# Patient Record
Sex: Male | Born: 1945
Health system: Southern US, Community
[De-identification: ages and names within clinical notes are randomized; demographics above are authoritative.]

## PROBLEM LIST (undated history)

## (undated) DIAGNOSIS — Z8679 Personal history of other diseases of the circulatory system: Secondary | ICD-10-CM

## (undated) DIAGNOSIS — M549 Dorsalgia, unspecified: Secondary | ICD-10-CM

## (undated) DIAGNOSIS — G8929 Other chronic pain: Secondary | ICD-10-CM

## (undated) DIAGNOSIS — J90 Pleural effusion, not elsewhere classified: Secondary | ICD-10-CM

## (undated) DIAGNOSIS — Z951 Presence of aortocoronary bypass graft: Secondary | ICD-10-CM

## (undated) DIAGNOSIS — E669 Obesity, unspecified: Secondary | ICD-10-CM

## (undated) DIAGNOSIS — I480 Paroxysmal atrial fibrillation: Secondary | ICD-10-CM

## (undated) DIAGNOSIS — I872 Venous insufficiency (chronic) (peripheral): Secondary | ICD-10-CM

## (undated) DIAGNOSIS — I200 Unstable angina: Secondary | ICD-10-CM

## (undated) DIAGNOSIS — E785 Hyperlipidemia, unspecified: Secondary | ICD-10-CM

## (undated) DIAGNOSIS — I2510 Atherosclerotic heart disease of native coronary artery without angina pectoris: Secondary | ICD-10-CM

## (undated) DIAGNOSIS — I440 Atrioventricular block, first degree: Secondary | ICD-10-CM

## (undated) HISTORY — PX: HERNIA REPAIR: SHX51

## (undated) HISTORY — DX: Atherosclerotic heart disease of native coronary artery without angina pectoris: I25.10

## (undated) HISTORY — PX: DUPUYTREN / PALMAR FASCIOTOMY: SUR601

---

## 1998-10-24 ENCOUNTER — Ambulatory Visit: Admission: RE | Admit: 1998-10-24 | Discharge: 1998-10-24 | Payer: Self-pay | Admitting: Family Medicine

## 1998-10-24 ENCOUNTER — Encounter: Payer: Self-pay | Admitting: Family Medicine

## 1999-06-17 ENCOUNTER — Encounter: Payer: Self-pay | Admitting: Family Medicine

## 1999-06-17 ENCOUNTER — Ambulatory Visit (HOSPITAL_COMMUNITY): Admission: RE | Admit: 1999-06-17 | Discharge: 1999-06-17 | Payer: Self-pay | Admitting: Family Medicine

## 2001-05-20 ENCOUNTER — Ambulatory Visit (HOSPITAL_COMMUNITY): Admission: RE | Admit: 2001-05-20 | Discharge: 2001-05-20 | Payer: Self-pay

## 2001-05-20 ENCOUNTER — Encounter (INDEPENDENT_AMBULATORY_CARE_PROVIDER_SITE_OTHER): Payer: Self-pay | Admitting: Specialist

## 2003-01-15 ENCOUNTER — Emergency Department (HOSPITAL_COMMUNITY): Admission: EM | Admit: 2003-01-15 | Discharge: 2003-01-15 | Payer: Self-pay | Admitting: Emergency Medicine

## 2003-01-15 ENCOUNTER — Encounter: Payer: Self-pay | Admitting: Emergency Medicine

## 2005-03-09 ENCOUNTER — Ambulatory Visit: Payer: Self-pay | Admitting: Internal Medicine

## 2005-04-26 ENCOUNTER — Ambulatory Visit: Payer: Self-pay | Admitting: Internal Medicine

## 2005-05-18 ENCOUNTER — Ambulatory Visit: Payer: Self-pay | Admitting: Internal Medicine

## 2009-01-25 ENCOUNTER — Ambulatory Visit: Payer: Self-pay | Admitting: Internal Medicine

## 2009-01-25 DIAGNOSIS — T887XXA Unspecified adverse effect of drug or medicament, initial encounter: Secondary | ICD-10-CM

## 2009-01-25 DIAGNOSIS — M546 Pain in thoracic spine: Secondary | ICD-10-CM | POA: Insufficient documentation

## 2009-01-26 ENCOUNTER — Ambulatory Visit: Payer: Self-pay | Admitting: Internal Medicine

## 2009-01-27 ENCOUNTER — Encounter: Payer: Self-pay | Admitting: Internal Medicine

## 2009-01-27 LAB — CONVERTED CEMR LAB
Cholesterol: 173 mg/dL (ref 0–200)
Glucose, Bld: 87 mg/dL (ref 70–99)
HDL: 31.9 mg/dL — ABNORMAL LOW (ref >39.0)
LDL Cholesterol: 123 mg/dL — ABNORMAL HIGH (ref 0–99)
Total CHOL/HDL Ratio: 5.4
Triglycerides: 90 mg/dL (ref 0–149)
VLDL: 18 mg/dL (ref 0–40)

## 2011-05-11 NOTE — Op Note (Signed)
[place]. [facility]  Patient:    Alfred Thompson, Alfred Thompson                        MRN: [id number] Proc. Date: 05/20/01 Adm. Date:  [id number] Attending:  Gennie Alma CC:         Titus Dubin. Alwyn Ren, M.D. [facility]   Operative Report  [facility] [phone].  PREOPERATIVE DIAGNOSIS:  Left inguinal hernia.  POSTOPERATIVE DIAGNOSIS:  Indirect left inguinal hernia.  PROCEDURE:  Repair of left inguinal hernia with mesh.  SURGEON:  Milus Mallick, M.D.  ASSISTANT:  Gita Kudo, M.D.  ANESTHESIA:  Local infiltration with 1% Xylocaine with 1:200,000 parts of epinephrine, 27 cc, and monitored anesthesia care.  DESCRIPTION OF PROCEDURE:  Under adequate perioperative intravenous sedation, the patients left inguinal region was prepared and draped in the usual fashion.  Satisfactory local and field block anesthesia was instilled with the above anesthetic.  An oblique incision was made overlying the left inguinal canal and carried down through Scarpas and Campers fascia.  Bleeders were electrocoagulated.  Two large veins were divided over hemostats, which were ligated with 4-0 Vicryl.  The external oblique aponeurosis was exposed and opened in the direction of its fibers and out through the external inguinal ring.  The ilioinguinal nerve was placed in a position of protection during the procedure.  The spermatic cord was dissected up out of the inguinal canal and encircled with a Penrose drain.  There was a moderate-sized indirect hernia present as well as a lipoma of the spermatic cord.  The lipoma was dissected out and excised over a hemostat, which was ligated with 4-0 Vicryl. The hernia sac was dissected off of the cord structures, and a high ligation was done with 2-0 silk after reducing some omentum that was in the hernia sac. The redundant sac was excised, and the stump was allowed to retract into the preperitoneal space.  The repair was then carried out  using Atrium mesh, which was cut to appropriate size and then placed on the floor of the inguinal canal and sewn in position with interrupted sutures of 0 Surgilon.  Along the inferior side, the sutures were placed into the inguinal ligament and along the superior side into the conjoined tendon.  The apex suture was into the pubic tubercle.  Then the lateral aspect of the mesh was cut in order to accommodate the spermatic cord and the ilioinguinal nerve.  The tails of the mesh were crossed lateral to these structures to recreate the internal ring.  The tails were sutured to the inguinal ligament with 0 Surgilon.  Hemostasis was ascertained.  The external oblique aponeurosis was closed over the cord and the mesh with continuous suture of 3-0 Vicryl.  Scarpas fascia was closed with continuous suture of 4-0 Vicryl, and the subcuticular layer was reapproximated with a continuous suture of 5-0 Vicryl.  Half-inch Steri-Strips were applied to the skin.  Sterile dressing was applied.  The estimated blood loss for the procedure was negligible.  The patient tolerated the procedure well and left the operating room in satisfactory condition. DD:  05/20/01 TD:  05/20/01 Job: [id number]

## 2011-11-30 ENCOUNTER — Telehealth: Payer: Self-pay

## 2011-11-30 ENCOUNTER — Ambulatory Visit: Payer: Self-pay | Admitting: Internal Medicine

## 2011-11-30 NOTE — Telephone Encounter (Signed)
Message left on voicemail: ? If patient should see primary doctor or dentist   Patient with soreness in his mouth, not tooth. Patient's wife questions is he could have a mouth ulcer. Patient scheduled to be seen today at 4:45, Jasmine December will check with her husband, if patient unable to come at that time Jasmine December will call to reschedule

## 2011-12-03 ENCOUNTER — Ambulatory Visit: Payer: Self-pay | Admitting: Internal Medicine

## 2012-09-18 ENCOUNTER — Ambulatory Visit: Payer: Self-pay | Admitting: Internal Medicine

## 2013-09-23 DIAGNOSIS — J90 Pleural effusion, not elsewhere classified: Secondary | ICD-10-CM

## 2013-09-23 HISTORY — DX: Pleural effusion, not elsewhere classified: J90

## 2013-10-09 ENCOUNTER — Encounter (HOSPITAL_COMMUNITY)
Admission: EM | Disposition: A | Discharge status: Home / Self Care | Payer: Self-pay | Source: Home / Self Care | Attending: Surgery

## 2013-10-09 ENCOUNTER — Other Ambulatory Visit: Payer: Self-pay | Admitting: *Deleted

## 2013-10-09 ENCOUNTER — Inpatient Hospital Stay (HOSPITAL_COMMUNITY): Payer: Medicare Other

## 2013-10-09 ENCOUNTER — Emergency Department (HOSPITAL_COMMUNITY): Payer: Medicare Other

## 2013-10-09 ENCOUNTER — Encounter (HOSPITAL_COMMUNITY): Payer: Self-pay | Admitting: Emergency Medicine

## 2013-10-09 ENCOUNTER — Inpatient Hospital Stay (HOSPITAL_COMMUNITY)
Admission: EM | Admit: 2013-10-09 | Discharge: 2013-10-20 | DRG: 234 | Disposition: A | Discharge status: Home / Self Care | Payer: Medicare Other | Attending: Surgery | Admitting: Surgery

## 2013-10-09 DIAGNOSIS — E669 Obesity, unspecified: Secondary | ICD-10-CM | POA: Insufficient documentation

## 2013-10-09 DIAGNOSIS — I4891 Unspecified atrial fibrillation: Secondary | ICD-10-CM | POA: Diagnosis present

## 2013-10-09 DIAGNOSIS — K560 Paralytic ileus: Secondary | ICD-10-CM | POA: Diagnosis not present

## 2013-10-09 DIAGNOSIS — I4892 Unspecified atrial flutter: Secondary | ICD-10-CM | POA: Diagnosis not present

## 2013-10-09 DIAGNOSIS — I2510 Atherosclerotic heart disease of native coronary artery without angina pectoris: Secondary | ICD-10-CM

## 2013-10-09 DIAGNOSIS — Z7982 Long term (current) use of aspirin: Secondary | ICD-10-CM

## 2013-10-09 DIAGNOSIS — Z951 Presence of aortocoronary bypass graft: Secondary | ICD-10-CM

## 2013-10-09 DIAGNOSIS — Z6833 Body mass index (BMI) 33.0-33.9, adult: Secondary | ICD-10-CM

## 2013-10-09 DIAGNOSIS — K5900 Constipation, unspecified: Secondary | ICD-10-CM | POA: Diagnosis not present

## 2013-10-09 DIAGNOSIS — I480 Paroxysmal atrial fibrillation: Secondary | ICD-10-CM

## 2013-10-09 DIAGNOSIS — M549 Dorsalgia, unspecified: Secondary | ICD-10-CM | POA: Diagnosis present

## 2013-10-09 DIAGNOSIS — E8779 Other fluid overload: Secondary | ICD-10-CM | POA: Diagnosis not present

## 2013-10-09 DIAGNOSIS — Z8679 Personal history of other diseases of the circulatory system: Secondary | ICD-10-CM | POA: Diagnosis present

## 2013-10-09 DIAGNOSIS — G8929 Other chronic pain: Secondary | ICD-10-CM | POA: Diagnosis present

## 2013-10-09 DIAGNOSIS — I200 Unstable angina: Secondary | ICD-10-CM

## 2013-10-09 DIAGNOSIS — E119 Type 2 diabetes mellitus without complications: Secondary | ICD-10-CM | POA: Diagnosis present

## 2013-10-09 DIAGNOSIS — Z01810 Encounter for preprocedural cardiovascular examination: Secondary | ICD-10-CM

## 2013-10-09 DIAGNOSIS — R32 Unspecified urinary incontinence: Secondary | ICD-10-CM | POA: Diagnosis not present

## 2013-10-09 DIAGNOSIS — D6959 Other secondary thrombocytopenia: Secondary | ICD-10-CM | POA: Diagnosis not present

## 2013-10-09 DIAGNOSIS — I209 Angina pectoris, unspecified: Secondary | ICD-10-CM | POA: Insufficient documentation

## 2013-10-09 DIAGNOSIS — D62 Acute posthemorrhagic anemia: Secondary | ICD-10-CM | POA: Diagnosis not present

## 2013-10-09 DIAGNOSIS — I10 Essential (primary) hypertension: Secondary | ICD-10-CM

## 2013-10-09 DIAGNOSIS — I519 Heart disease, unspecified: Secondary | ICD-10-CM | POA: Diagnosis not present

## 2013-10-09 DIAGNOSIS — I498 Other specified cardiac arrhythmias: Secondary | ICD-10-CM | POA: Diagnosis not present

## 2013-10-09 DIAGNOSIS — Z79899 Other long term (current) drug therapy: Secondary | ICD-10-CM

## 2013-10-09 DIAGNOSIS — I517 Cardiomegaly: Secondary | ICD-10-CM | POA: Diagnosis present

## 2013-10-09 HISTORY — DX: Unstable angina: I20.0

## 2013-10-09 HISTORY — DX: Paroxysmal atrial fibrillation: I48.0

## 2013-10-09 HISTORY — DX: Other chronic pain: G89.29

## 2013-10-09 HISTORY — DX: Presence of aortocoronary bypass graft: Z95.1

## 2013-10-09 HISTORY — DX: Atherosclerotic heart disease of native coronary artery without angina pectoris: I25.10

## 2013-10-09 HISTORY — DX: Obesity, unspecified: E66.9

## 2013-10-09 HISTORY — PX: LEFT HEART CATHETERIZATION WITH CORONARY ANGIOGRAM: SHX5451

## 2013-10-09 HISTORY — DX: Dorsalgia, unspecified: M54.9

## 2013-10-09 LAB — ABO/RH: ABO/RH(D): A POS

## 2013-10-09 LAB — COMPREHENSIVE METABOLIC PANEL
ALT: 20 U/L (ref 0–53)
AST: 19 U/L (ref 0–37)
Albumin: 3.7 g/dL (ref 3.5–5.2)
Alkaline Phosphatase: 46 U/L (ref 39–117)
BUN: 11 mg/dL (ref 6–23)
CO2: 27 mEq/L (ref 19–32)
Calcium: 8.9 mg/dL (ref 8.4–10.5)
Chloride: 106 mEq/L (ref 96–112)
GFR calc Af Amer: 81 mL/min — ABNORMAL LOW (ref >90)
GFR calc non Af Amer: 70 mL/min — ABNORMAL LOW (ref >90)
Glucose, Bld: 100 mg/dL — ABNORMAL HIGH (ref 70–99)
Sodium: 141 mEq/L (ref 135–145)
Total Bilirubin: 0.9 mg/dL (ref 0.3–1.2)

## 2013-10-09 LAB — BASIC METABOLIC PANEL
BUN: 11 mg/dL (ref 6–23)
CO2: 24 mEq/L (ref 19–32)
Calcium: 9.6 mg/dL (ref 8.4–10.5)
Chloride: 104 mEq/L (ref 96–112)
Creatinine, Ser: 1.06 mg/dL (ref 0.50–1.35)
GFR calc Af Amer: 83 mL/min — ABNORMAL LOW (ref >90)
GFR calc non Af Amer: 72 mL/min — ABNORMAL LOW (ref >90)
Glucose, Bld: 150 mg/dL — ABNORMAL HIGH (ref 70–99)
Potassium: 4 mEq/L (ref 3.5–5.1)
Sodium: 141 mEq/L (ref 135–145)

## 2013-10-09 LAB — APTT: aPTT: 43 seconds — ABNORMAL HIGH (ref 24–37)

## 2013-10-09 LAB — TROPONIN I: Troponin I: <0.3 ng/mL (ref <0.30)

## 2013-10-09 LAB — CBC
HCT: 47.2 % (ref 39.0–52.0)
Hemoglobin: 17.3 g/dL — ABNORMAL HIGH (ref 13.0–17.0)
MCH: 33.7 pg (ref 26.0–34.0)
MCHC: 36.7 g/dL — ABNORMAL HIGH (ref 30.0–36.0)
MCV: 91.8 fL (ref 78.0–100.0)
Platelets: 183 10*3/uL (ref 150–400)
RBC: 5.14 MIL/uL (ref 4.22–5.81)
RDW: 13.2 % (ref 11.5–15.5)
WBC: 9.2 10*3/uL (ref 4.0–10.5)

## 2013-10-09 LAB — POCT ACTIVATED CLOTTING TIME: Activated Clotting Time: 140 seconds

## 2013-10-09 LAB — POCT I-STAT 3, ART BLOOD GAS (G3+)
Bicarbonate: 24.5 mEq/L — ABNORMAL HIGH (ref 20.0–24.0)
Patient temperature: 98.7
TCO2: 26 mmol/L (ref 0–100)
pH, Arterial: 7.39 (ref 7.350–7.450)
pO2, Arterial: 94 mmHg (ref 80.0–100.0)

## 2013-10-09 LAB — CBC WITH DIFFERENTIAL/PLATELET
Basophils Absolute: 0 10*3/uL (ref 0.0–0.1)
Eosinophils Relative: 2 % (ref 0–5)
HCT: 45.9 % (ref 39.0–52.0)
Lymphocytes Relative: 18 % (ref 12–46)
Lymphs Abs: 1.7 10*3/uL (ref 0.7–4.0)
MCV: 92.9 fL (ref 78.0–100.0)
Monocytes Absolute: 1 10*3/uL (ref 0.1–1.0)
Monocytes Relative: 11 % (ref 3–12)
Neutro Abs: 6.4 10*3/uL (ref 1.7–7.7)
Platelets: 168 10*3/uL (ref 150–400)
RBC: 4.94 MIL/uL (ref 4.22–5.81)
WBC: 9.3 10*3/uL (ref 4.0–10.5)

## 2013-10-09 LAB — MRSA PCR SCREENING: MRSA by PCR: NEGATIVE

## 2013-10-09 LAB — TYPE AND SCREEN: ABO/RH(D): A POS

## 2013-10-09 LAB — PROTIME-INR: INR: 1.19 (ref 0.00–1.49)

## 2013-10-09 LAB — MAGNESIUM: Magnesium: 2.1 mg/dL (ref 1.5–2.5)

## 2013-10-09 LAB — SURGICAL PCR SCREEN
MRSA, PCR: NEGATIVE
Staphylococcus aureus: NEGATIVE

## 2013-10-09 LAB — POCT I-STAT TROPONIN I: Troponin i, poc: 0.01 ng/mL (ref 0.00–0.08)

## 2013-10-09 LAB — URINALYSIS, ROUTINE W REFLEX MICROSCOPIC
Ketones, ur: 15 mg/dL — AB
Leukocytes, UA: NEGATIVE
Nitrite: NEGATIVE
Protein, ur: NEGATIVE mg/dL
Urobilinogen, UA: 0.2 mg/dL (ref 0.0–1.0)

## 2013-10-09 LAB — PRO B NATRIURETIC PEPTIDE: Pro B Natriuretic peptide (BNP): 268.2 pg/mL — ABNORMAL HIGH (ref 0–125)

## 2013-10-09 LAB — TSH: TSH: 0.76 u[IU]/mL (ref 0.350–4.500)

## 2013-10-09 SURGERY — LEFT HEART CATHETERIZATION WITH CORONARY ANGIOGRAM
Anesthesia: LOCAL

## 2013-10-09 MED ORDER — ONDANSETRON HCL 4 MG/2ML IJ SOLN: 4.0000 mg | Freq: Four times a day (QID) | INTRAMUSCULAR | Status: DC | PRN

## 2013-10-09 MED ORDER — CHLORHEXIDINE GLUCONATE 4 % EX LIQD: 60.0000 mL | Freq: Once | CUTANEOUS | Status: DC

## 2013-10-09 MED ORDER — SODIUM CHLORIDE 0.9 % IJ SOLN: 3.0000 mL | Freq: Two times a day (BID) | INTRAMUSCULAR | Status: DC

## 2013-10-09 MED ORDER — VERAPAMIL HCL 2.5 MG/ML IV SOLN
INTRAVENOUS | Status: AC
  Filled 2013-10-09: qty 2

## 2013-10-09 MED ORDER — HEPARIN BOLUS VIA INFUSION
4000.0000 [IU] | Freq: Once | INTRAVENOUS | Status: AC
  Administered 2013-10-09: 4000 [IU] via INTRAVENOUS
  Filled 2013-10-09: qty 4000

## 2013-10-09 MED ORDER — AMIODARONE HCL IN DEXTROSE 360-4.14 MG/200ML-% IV SOLN
30.0000 mg/h | INTRAVENOUS | Status: DC
  Filled 2013-10-09: qty 200

## 2013-10-09 MED ORDER — ASPIRIN 81 MG PO CHEW
324.0000 mg | CHEWABLE_TABLET | Freq: Once | ORAL | Status: AC
  Administered 2013-10-09: 324 mg via ORAL
  Filled 2013-10-09: qty 4

## 2013-10-09 MED ORDER — ASPIRIN 325 MG PO TABS
325.0000 mg | ORAL_TABLET | Freq: Every day | ORAL | Status: DC
  Filled 2013-10-09: qty 1

## 2013-10-09 MED ORDER — SODIUM CHLORIDE 0.9 % IV SOLN
INTRAVENOUS | Status: DC
  Administered 2013-10-09: 75 mL/h via INTRAVENOUS

## 2013-10-09 MED ORDER — METOPROLOL TARTRATE 12.5 MG HALF TABLET
12.5000 mg | ORAL_TABLET | Freq: Once | ORAL | Status: AC
  Administered 2013-10-10: 12.5 mg via ORAL
  Filled 2013-10-09: qty 1

## 2013-10-09 MED ORDER — DEXTROSE 5 % IV SOLN
750.0000 mg | INTRAVENOUS | Status: DC
  Filled 2013-10-09: qty 750

## 2013-10-09 MED ORDER — HEPARIN (PORCINE) IN NACL 100-0.45 UNIT/ML-% IJ SOLN
1100.0000 [IU]/h | INTRAMUSCULAR | Status: DC
  Administered 2013-10-09: 1100 [IU]/h via INTRAVENOUS
  Filled 2013-10-09 (×2): qty 250

## 2013-10-09 MED ORDER — SODIUM CHLORIDE 0.9 % IV SOLN: 250.0000 mL | INTRAVENOUS | Status: DC | PRN

## 2013-10-09 MED ORDER — TEMAZEPAM 15 MG PO CAPS: 15.0000 mg | ORAL_CAPSULE | Freq: Once | ORAL | Status: DC | PRN

## 2013-10-09 MED ORDER — HEPARIN (PORCINE) IN NACL 100-0.45 UNIT/ML-% IJ SOLN
1100.0000 [IU]/h | INTRAMUSCULAR | Status: DC
  Administered 2013-10-09: 1100 [IU]/h via INTRAVENOUS
  Filled 2013-10-09: qty 250

## 2013-10-09 MED ORDER — DOPAMINE-DEXTROSE 3.2-5 MG/ML-% IV SOLN
2.0000 ug/kg/min | INTRAVENOUS | Status: DC
  Filled 2013-10-09: qty 250

## 2013-10-09 MED ORDER — LIDOCAINE HCL (PF) 1 % IJ SOLN
INTRAMUSCULAR | Status: AC
  Filled 2013-10-09: qty 30

## 2013-10-09 MED ORDER — BISACODYL 5 MG PO TBEC: 5.0000 mg | DELAYED_RELEASE_TABLET | Freq: Once | ORAL | Status: DC

## 2013-10-09 MED ORDER — AMIODARONE HCL IN DEXTROSE 360-4.14 MG/200ML-% IV SOLN: 60.0000 mg/h | INTRAVENOUS | Status: AC

## 2013-10-09 MED ORDER — VANCOMYCIN HCL 10 G IV SOLR
1500.0000 mg | INTRAVENOUS | Status: DC
  Filled 2013-10-09: qty 1500

## 2013-10-09 MED ORDER — FENTANYL CITRATE 0.05 MG/ML IJ SOLN
INTRAMUSCULAR | Status: AC
  Filled 2013-10-09: qty 2

## 2013-10-09 MED ORDER — SODIUM CHLORIDE 0.9 % IJ SOLN: 3.0000 mL | INTRAMUSCULAR | Status: DC | PRN

## 2013-10-09 MED ORDER — ASPIRIN EC 81 MG PO TBEC
81.0000 mg | DELAYED_RELEASE_TABLET | Freq: Every day | ORAL | Status: DC | PRN
  Filled 2013-10-09: qty 1

## 2013-10-09 MED ORDER — ZOLPIDEM TARTRATE 5 MG PO TABS
5.0000 mg | ORAL_TABLET | Freq: Every evening | ORAL | Status: DC | PRN
  Administered 2013-10-10: 5 mg via ORAL
  Filled 2013-10-09: qty 1

## 2013-10-09 MED ORDER — PANTOPRAZOLE SODIUM 40 MG PO TBEC
40.0000 mg | DELAYED_RELEASE_TABLET | Freq: Every day | ORAL | Status: DC
  Administered 2013-10-10: 40 mg via ORAL
  Filled 2013-10-09: qty 1

## 2013-10-09 MED ORDER — DIAZEPAM 5 MG PO TABS
5.0000 mg | ORAL_TABLET | Freq: Once | ORAL | Status: AC
  Administered 2013-10-10: 5 mg via ORAL
  Filled 2013-10-09: qty 1

## 2013-10-09 MED ORDER — ASPIRIN 81 MG PO CHEW: 81.0000 mg | CHEWABLE_TABLET | ORAL | Status: DC

## 2013-10-09 MED ORDER — HEPARIN (PORCINE) IN NACL 2-0.9 UNIT/ML-% IJ SOLN
INTRAMUSCULAR | Status: AC
  Filled 2013-10-09: qty 1000

## 2013-10-09 MED ORDER — DIAZEPAM 5 MG PO TABS
5.0000 mg | ORAL_TABLET | ORAL | Status: AC
  Administered 2013-10-09: 5 mg via ORAL
  Filled 2013-10-09: qty 1

## 2013-10-09 MED ORDER — HEPARIN SODIUM (PORCINE) 1000 UNIT/ML IJ SOLN
INTRAMUSCULAR | Status: DC
  Filled 2013-10-09: qty 30

## 2013-10-09 MED ORDER — AMIODARONE LOAD VIA INFUSION
150.0000 mg | Freq: Once | INTRAVENOUS | Status: AC
  Administered 2013-10-09: 150 mg via INTRAVENOUS

## 2013-10-09 MED ORDER — POTASSIUM CHLORIDE 2 MEQ/ML IV SOLN
80.0000 meq | INTRAVENOUS | Status: DC
  Filled 2013-10-09: qty 40

## 2013-10-09 MED ORDER — PHENYLEPHRINE HCL 10 MG/ML IJ SOLN
30.0000 ug/min | INTRAVENOUS | Status: DC
  Filled 2013-10-09: qty 2

## 2013-10-09 MED ORDER — MAGNESIUM SULFATE 50 % IJ SOLN
40.0000 meq | INTRAMUSCULAR | Status: DC
  Filled 2013-10-09: qty 10

## 2013-10-09 MED ORDER — SODIUM CHLORIDE 0.9 % IV SOLN
INTRAVENOUS | Status: DC
  Filled 2013-10-09: qty 1

## 2013-10-09 MED ORDER — EPINEPHRINE HCL 1 MG/ML IJ SOLN
0.5000 ug/min | INTRAVENOUS | Status: DC
  Filled 2013-10-09: qty 4

## 2013-10-09 MED ORDER — MIDAZOLAM HCL 2 MG/2ML IJ SOLN
INTRAMUSCULAR | Status: AC
  Filled 2013-10-09: qty 2

## 2013-10-09 MED ORDER — HEPARIN SODIUM (PORCINE) 1000 UNIT/ML IJ SOLN
INTRAMUSCULAR | Status: AC
  Filled 2013-10-09: qty 1

## 2013-10-09 MED ORDER — ALPRAZOLAM 0.25 MG PO TABS: 0.2500 mg | ORAL_TABLET | Freq: Three times a day (TID) | ORAL | Status: DC | PRN

## 2013-10-09 MED ORDER — METOPROLOL TARTRATE 25 MG PO TABS
25.0000 mg | ORAL_TABLET | Freq: Two times a day (BID) | ORAL | Status: DC
  Administered 2013-10-09: 25 mg via ORAL
  Filled 2013-10-09 (×4): qty 1

## 2013-10-09 MED ORDER — PLASMA-LYTE 148 IV SOLN
INTRAVENOUS | Status: DC
  Filled 2013-10-09: qty 2.5

## 2013-10-09 MED ORDER — ACETAMINOPHEN 325 MG PO TABS: 650.0000 mg | ORAL_TABLET | ORAL | Status: DC | PRN

## 2013-10-09 MED ORDER — DEXMEDETOMIDINE HCL IN NACL 400 MCG/100ML IV SOLN
0.1000 ug/kg/h | INTRAVENOUS | Status: DC
  Filled 2013-10-09: qty 100

## 2013-10-09 MED ORDER — METOPROLOL TARTRATE 25 MG PO TABS
25.0000 mg | ORAL_TABLET | Freq: Once | ORAL | Status: AC
  Administered 2013-10-09: 25 mg via ORAL
  Filled 2013-10-09: qty 1

## 2013-10-09 MED ORDER — DILTIAZEM HCL 100 MG IV SOLR
5.0000 mg/h | INTRAVENOUS | Status: DC
  Administered 2013-10-09: 5 mg/h via INTRAVENOUS
  Filled 2013-10-09: qty 100

## 2013-10-09 MED ORDER — SODIUM CHLORIDE 0.9 % IV SOLN
INTRAVENOUS | Status: DC
  Filled 2013-10-09: qty 40

## 2013-10-09 MED ORDER — NITROGLYCERIN 0.2 MG/ML ON CALL CATH LAB
INTRAVENOUS | Status: AC
  Filled 2013-10-09: qty 1

## 2013-10-09 MED ORDER — DEXTROSE 5 % IV SOLN
1.5000 g | INTRAVENOUS | Status: DC
  Filled 2013-10-09: qty 1.5

## 2013-10-09 MED ORDER — PANTOPRAZOLE SODIUM 40 MG PO TBEC: 40.0000 mg | DELAYED_RELEASE_TABLET | Freq: Every day | ORAL | Status: DC

## 2013-10-09 MED ORDER — SODIUM CHLORIDE 0.9 % IV SOLN: 1.0000 mL/kg/h | INTRAVENOUS | Status: DC

## 2013-10-09 MED ORDER — DILTIAZEM LOAD VIA INFUSION
15.0000 mg | Freq: Once | INTRAVENOUS | Status: DC
  Filled 2013-10-09: qty 15

## 2013-10-09 MED ORDER — AMIODARONE HCL IN DEXTROSE 360-4.14 MG/200ML-% IV SOLN
30.0000 mg/h | INTRAVENOUS | Status: DC
  Administered 2013-10-10 – 2013-10-11 (×2): 30 mg/h via INTRAVENOUS
  Filled 2013-10-09 (×10): qty 200

## 2013-10-09 MED ORDER — NITROGLYCERIN IN D5W 200-5 MCG/ML-% IV SOLN
2.0000 ug/min | INTRAVENOUS | Status: DC
  Filled 2013-10-09: qty 250

## 2013-10-09 NOTE — H&P (Signed)
Patient ID: Alfred Thompson MRN: [id number], DOB/AGE: May 21, 1946   Admit date: 10/09/2013   Primary Physician: Marga Melnick, MD Primary Cardiologist: Dr Herbie Baltimore (new)  HPI: 67 y/o male followed by Dr Alwyn Ren. He has no history of CAD or chest pain. No history of arrythmia. He says he had a stress test several years ago. He was a runner up until about 15 yrs ago. He says he "stopped running but kept eating". Last night he developed an uncomfortable feeling in his chest that persisted after taking antacids. He then developed some discomfort in his arms and asked his wife to bring him to the ER. He arrived here around 8:30 am. He was noted to be in rapid AF. His chest pain subsided with rate control after receiving Diltiazem. His Diltiazem had to be stopped secondary to low B/P with a systolic B/P in the 90s on 5 mg/hr. He remains in AF but is currently pain free. He does admit to a similar episode last weekend that lasted 3 hrs and resolved spontaneously. He denies palpitations, SOB, or diaphoresis.   Problem List: Past Medical History  Diagnosis Date  . Back pain, chronic   . Obesity (BMI 30-39.9)     Past Surgical History  Procedure Laterality Date  . Hernia repair    . Dupuytren / palmar fasciotomy Right      Allergies: No Known Allergies   Home Medications Current Facility-Administered Medications  Medication Dose Route Frequency Provider Last Rate Last Dose  . diltiazem (CARDIZEM) 1 mg/mL load via infusion 15 mg  15 mg Intravenous Once Raeford Razor, MD       And  . diltiazem (CARDIZEM) 100 mg in dextrose 5 % 100 mL infusion  5-15 mg/hr Intravenous Continuous Raeford Razor, MD 5 mL/hr at 10/09/13 0948 5 mg/hr at 10/09/13 0948   No current outpatient prescriptions on file.     Family History  Problem Relation Age of Onset  . Coronary artery disease Father 27    CABG     History   Social History  . Marital Status: Single    Spouse Name: N/A    Number of Children: N/A  .  Years of Education: N/A   Occupational History  . Not on file.   Social History Main Topics  . Smoking status: Never Smoker   . Smokeless tobacco: Not on file  . Alcohol Use: Yes  . Drug Use: No  . Sexual Activity: Not on file   Other Topics Concern  . Not on file   Social History Narrative   Married, one sone, no grandchildren. He drives for [employer]     Review of Systems: General: negative for chills, fever, night sweats or weight changes.  Cardiovascular: negative for chest pain, dyspnea on exertion, edema, orthopnea, palpitations, paroxysmal nocturnal dyspnea or shortness of breath Dermatological: negative for rash Respiratory: negative for cough or wheezing Urologic: negative for hematuria Abdominal: negative for nausea, vomiting, diarrhea, bright red blood per rectum, melena, or hematemesis Neurologic: negative for visual changes, syncope, or dizziness All other systems reviewed and are otherwise negative except as noted above.  Physical Exam: Blood pressure 93/61, pulse 119, temperature 98.2 F (36.8 C), temperature source Oral, resp. rate 17, height 5\' 8"  (1.727 m), weight 214 lb (97.07 kg), SpO2 96.00%.  General appearance: alert, cooperative, no distress and moderately obese Neck: no carotid bruit, no JVD and supple, symmetrical, trachea midline Lungs: clear to auscultation bilaterally Heart: irregularly irregular rhythm Abdomen: obese, non tender,  small umbilical hernia Extremities: extremities normal, atraumatic, no cyanosis or edema Pulses: 2+ and symmetric Skin: Skin color, texture, turgor normal. No rashes or lesions Neurologic: Grossly normal    Labs:   Results for orders placed during the hospital encounter of 10/09/13 (from the past 24 hour(s))  CBC     Status: Abnormal   Collection Time    10/09/13  8:47 AM      Result Value Range   WBC 9.2  4.0 - 10.5 K/uL   RBC 5.14  4.22 - 5.81 MIL/uL   Hemoglobin 17.3 (*) 13.0 - 17.0 g/dL   HCT  40.9  81.1 - 91.4 %   MCV 91.8  78.0 - 100.0 fL   MCH 33.7  26.0 - 34.0 pg   MCHC 36.7 (*) 30.0 - 36.0 g/dL   RDW 78.2  95.6 - 21.3 %   Platelets 183  150 - 400 K/uL  BASIC METABOLIC PANEL     Status: Abnormal   Collection Time    10/09/13  8:47 AM      Result Value Range   Sodium 141  135 - 145 mEq/L   Potassium 4.0  3.5 - 5.1 mEq/L   Chloride 104  96 - 112 mEq/L   CO2 24  19 - 32 mEq/L   Glucose, Bld 150 (*) 70 - 99 mg/dL   BUN 11  6 - 23 mg/dL   Creatinine, Ser 0.86  0.50 - 1.35 mg/dL   Calcium 9.6  8.4 - 57.8 mg/dL   GFR calc non Af Amer 72 (*) >90 mL/min   GFR calc Af Amer 83 (*) >90 mL/min  MAGNESIUM     Status: None   Collection Time    10/09/13  8:47 AM      Result Value Range   Magnesium 2.1  1.5 - 2.5 mg/dL  POCT I-STAT TROPONIN I     Status: None   Collection Time    10/09/13  8:59 AM      Result Value Range   Troponin i, poc 0.01  0.00 - 0.08 ng/mL   Comment 3           PRO B NATRIURETIC PEPTIDE     Status: Abnormal   Collection Time    10/09/13 11:02 AM      Result Value Range   Pro B Natriuretic peptide (BNP) 268.2 (*) 0 - 125 pg/mL     Radiology/Studies: Dg Chest Port 1 View  10/09/2013   CLINICAL DATA:  Chest discomfort. Tachycardia.  EXAM: PORTABLE CHEST - 1 VIEW  COMPARISON:  No comparison chest x-ray. Comparison thoracic spine plain film examination 01/26/2009.  FINDINGS: Cardiomegaly.  Mild central pulmonary vascular prominence without pulmonary edema.  Left base subsegmental atelectasis versus linear scarring.  No segmental infiltrate or pneumothorax.  The patient would eventually benefit from two view chest.  IMPRESSION: Cardiomegaly with mild central pulmonary vascular prominence. Please see above.   Electronically Signed   By: Bridgett Larsson M.D.   On: 10/09/2013 09:21    EKG:AF with NSST changes  ASSESSMENT AND PLAN:  Principal Problem:   Unstable angina Active Problems:   Atrial fibrillation with RVR   Obesity (BMI 30.0-34.9)   PLAN: Will  discuss with MD- ?cath. Start IV Heparin, ASA, and start oral beta blocker.  Deland Pretty, PA-C 10/09/2013, 11:51 AM  I have seen and evaluated the patient this PM along with Corine Shelter, PA. I agree with his findings, examination as well as  impression recommendations.  Very pleasant 26 y/op man with no prior PMH noted.  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  He has noted vague chest tightness with climbing stairs or walking uphill at a brisk rate for several months.  He presented to [facility] ER this PM after being awakened by a nagging ~6-8/10 chest pressure that felt like a band squeezing his arms to his chest.  He did not note palpitations.  He had a similar episode ~1-2 weeks ago that resolved after a couple of hours, but this time it did not resolve.   He came in to the ER & was noted to be in Afib with RVR.  His rate dropped after IV Diltiazem & his chest discomfort improved.  Unfortunately, his BP dropped relatively abruptly.  With the slower rate, the chest discomfort abated.  This discomfort was similar to, just more severe than what he gets climbing stairs.    He is currently resting comfortably with HR in 80s-110s.   His exam is otherwise benign.  I think he needs to be admitted for HR control & hopeful conversion.   I am concerned, however about the level of chest discomfort that he was having.  His prior symptoms with discomfort climbing stairs & hills is consistent with Class II Angina.  He now presents with Afib (at least the second episode of prolonged chest discomfort) RVR with significant anginal pain.  He will need an ischemic evaluation.  We spent ~20 minutes discussing options.   Option #1: admit, rate contro, IV Heparin, r/o MI & CAth vs. Myoview based upon Troponin levels Option #2: This is indeed a manifestation of Unstable Angina (essentially a "failed Stress Test"), therefore, invasive ischemic evaluation is warranted.   Admit, rate control, but LHC/Angiography today to definitively delineate his coronary anatomy & PCI if indicated.    He is not currently on BB, ASA or statin, but we truly are not aware of his risk factors beyond obesity.    After a long discussion, he feels that he would prefer Option #2 for definitive evaluation.  If it were not for the underlying Class II Angina, I would probably have recommended Option #1, but with that history & now rest angina, I agree with Option #2 to clarify if his current Sx were simply related to Afib RVR, or due to occlusive CAD.   The procedure with Risks/Benefits/Alternatives and Indications was reviewed with the patient and his wife..  All questions were answered.    Risks / Complications include, but not limited to: Death, MI, CVA/TIA, VF/VT (with defibrillation), Bradycardia (need for temporary pacer placement), contrast induced nephropathy, bleeding / bruising / hematoma / pseudoaneurysm, vascular or coronary injury (with possible emergent CT or Vascular Surgery), adverse medication reactions, infection.    The patient (and wife) voice understanding and agree to proceed.    MD Time with pt: 45 min  HARDING,DAVID W, M.D., M.S. [facility] [address] [place], [place]  [address]  [phone] Pager # [phone] 10/09/2013 3:02 PM

## 2013-10-09 NOTE — ED Notes (Signed)
Pt c/o chest pressure and SOB with exertion starting at 0300; pt noted to be tachycardic

## 2013-10-09 NOTE — ED Notes (Addendum)
Cardizem gtt discontinued per verbal order from Dr. Juleen China d/t hypotension.  Pt denies any symptoms at present; GCS 15 - denies discomfort.  Wife at bedside; PA-C from Cards at bedside for consult. Pt reports increased stress due to mother's illness.

## 2013-10-09 NOTE — Interval H&P Note (Signed)
History and Physical Interval Note:  10/09/2013 3:20 PM  Alfred Thompson  has presented today for surgery, with the diagnosis of Unstable Angina. The various methods of treatment have been discussed with the patient and family. After consideration of risks, benefits and other options for treatment, the patient has consented to  Procedure(s): LEFT HEART CATHETERIZATION WITH CORONARY ANGIOGRAM (N/A) +/- PCI   Cath Lab Visit (complete for each Cath Lab visit)  Clinical Evaluation Leading to the Procedure:   ACS: yes  Non-ACS:    Anginal Classification: CCS II prior to current Unstable Angina  Anti-ischemic medical therapy: No Therapy  Non-Invasive Test Results: No non-invasive testing performed  Prior CABG: No previous CABG      as a surgical intervention .  The patient's history has been reviewed, patient examined, no change in status, stable for surgery.  I have reviewed the patient's chart and labs.  Questions were answered to the patient's satisfaction.     Alfred Thompson

## 2013-10-09 NOTE — Progress Notes (Signed)
ANTICOAGULATION CONSULT NOTE - Initial Consult  Pharmacy Consult for heparin Indication: chest pain/ACS  No Known Allergies  Patient Measurements: Height: 5\' 8"  (172.7 cm) Weight: 214 lb (97.07 kg) IBW/kg (Calculated) : 68.4 Heparin Dosing Weight: 89kg  Vital Signs: Temp: 98.2 F (36.8 C) (10/17 0846) Temp src: Oral (10/17 0846) BP: 93/61 mmHg (10/17 1113) Pulse Rate: 119 (10/17 1113)  Labs:  Recent Labs  10/09/13 0847  HGB 17.3*  HCT 47.2  PLT 183  CREATININE 1.06    Estimated Creatinine Clearance: 78.5 ml/min (by C-G formula based on Cr of 1.06).   Medical History: Past Medical History  Diagnosis Date  . Back pain, chronic   . Obesity (BMI 30-39.9)     Medications:  Infusions:  . diltiazem     And  . diltiazem (CARDIZEM) infusion 5 mg/hr (10/09/13 0948)  . heparin    . heparin      Assessment: 67 yom presented to the ED with CP. Found to be in afib. To start IV heparin for unstable angina and afib with RVR. Baseline H/H 17.3/47.2, plts 182 and pt is not on any anticoagulation PTA.   Goal of Therapy:  Heparin level 0.3-0.7 units/ml Monitor platelets by anticoagulation protocol: Yes   Plan:  1. Heparin bolus 4000 units IV x 1 2. Heparin gtt 1100 units/hr 3. Check a 6 hour heparin level 4. Daily heparin level and CBC  Jathniel Smeltzer, Drake Leach 10/09/2013,12:28 PM

## 2013-10-09 NOTE — Progress Notes (Addendum)
VASCULAR LAB PRELIMINARY  PRELIMINARY  PRELIMINARY  PRELIMINARY  Pre-op Cardiac Surgery  Carotid Findings:  Bilateral:  1-39% ICA stenosis.  Vertebral artery flow is antegrade.       Upper Extremity Right Left  Brachial Pressures  124 biphasic  Radial Waveforms  biphasic  Ulnar Waveforms  biphasic  Palmar Arch (Allen's Test) Cardiac cath via right radial artery. Doppler waveforms obliterate with ulnar and remain normal with radial compressions.   Findings:  Palpable pedal pulses x 4.    Ludwin Flahive, RVT 10/09/2013, 5:25 PM

## 2013-10-09 NOTE — Consult Note (Signed)
[address]       [place] [address]             [phone]        Reason for Consult: High grade left main and multi-vessel coronary artery disease Referring Physician:  Dr. Yitzhak Awan Lemma.  Alfred Thompson is an 67 y.o. male.  HPI:   67 y/o male with no history of CAD or chest pain. No history of arrythmia. He says he had a stress test several years ago. He was a runner up until about 15 yrs ago. He says he stopped running due to ankle injury but kept eating. Last night he developed an uncomfortable feeling in his chest that persisted after taking antacids. He then developed some discomfort in his arms and asked his wife to bring him to the ER. He arrived here around 8:30 am. He was noted to be in rapid AF. His chest pain subsided with rate control after receiving Diltiazem. His Diltiazem had to be stopped secondary to low B/P with a systolic B/P in the 90s on 5 mg/hr. He remains in AF but is currently pain free. He does admit to a similar episode last weekend that lasted 3 hrs and resolved spontaneously. Cath today shows 80% distal LM, high grade proximal RCA napkin ring lesion, and high grade LAD/diag with good LV function. He has been free of chest discomfort since his rate has been controlled.  Past Medical History  Diagnosis Date  . Back pain, chronic   . Obesity (BMI 30-39.9)     Past Surgical History  Procedure Laterality Date  . Hernia repair    . Dupuytren / palmar fasciotomy Right     Family History  Problem Relation Age of Onset  . Coronary artery disease Father 48    CABG    Social History:  reports that he has never smoked. He does not have any smokeless tobacco history on file. He reports that he drinks alcohol. He reports that he does not use illicit drugs.  Allergies: No Known Allergies  Medications:  I have reviewed the patient's current medications. Prior to Admission:  Prescriptions prior to admission  Medication Sig Dispense Refill    . aspirin EC 81 MG tablet Take 81 mg by mouth daily as needed for pain.      Marland Kitchen Bioflavonoid Products (VITAMIN C) CHEW Chew 1 tablet by mouth daily as needed (when feeling like coming down with a cold).      . calcium carbonate (TUMS EX) 750 MG chewable tablet Chew 2 tablets by mouth daily as needed for heartburn.      Marland Kitchen ibuprofen (ADVIL,MOTRIN) 200 MG tablet Take 800 mg by mouth daily as needed for pain. Monday-friday      . vitamin E 1000 UNIT capsule Take 1,000 Units by mouth daily.       Scheduled: . [START ON 10/10/2013] aspirin  325 mg Oral Daily  . metoprolol tartrate  25 mg Oral BID  . [START ON 10/10/2013] pantoprazole  40 mg Oral Q0600  . [START ON 10/10/2013] sodium chloride  3 mL Intravenous Q12H   Continuous: . sodium chloride 1 mL/kg/hr (10/09/13 1730)  . amiodarone (NEXTERONE PREMIX) 360 mg/200 mL dextrose 60 mg/hr (10/09/13 1730)   Followed by  . amiodarone (NEXTERONE PREMIX) 360 mg/200 mL dextrose    . heparin     BJY:NWGNFA chloride, acetaminophen, ALPRAZolam, aspirin EC, ondansetron (ZOFRAN) IV, sodium chloride, zolpidem  Results for orders  placed during the hospital encounter of 10/09/13 (from the past 48 hour(s))  CBC     Status: Abnormal   Collection Time    10/09/13  8:47 AM      Result Value Range   WBC 9.2  4.0 - 10.5 K/uL   RBC 5.14  4.22 - 5.81 MIL/uL   Hemoglobin 17.3 (*) 13.0 - 17.0 g/dL   HCT 11.9  14.7 - 82.9 %   MCV 91.8  78.0 - 100.0 fL   MCH 33.7  26.0 - 34.0 pg   MCHC 36.7 (*) 30.0 - 36.0 g/dL   RDW 56.2  13.0 - 86.5 %   Platelets 183  150 - 400 K/uL  BASIC METABOLIC PANEL     Status: Abnormal   Collection Time    10/09/13  8:47 AM      Result Value Range   Sodium 141  135 - 145 mEq/L   Potassium 4.0  3.5 - 5.1 mEq/L   Chloride 104  96 - 112 mEq/L   CO2 24  19 - 32 mEq/L   Glucose, Bld 150 (*) 70 - 99 mg/dL   BUN 11  6 - 23 mg/dL   Creatinine, Ser 7.84  0.50 - 1.35 mg/dL   Calcium 9.6  8.4 - 69.6 mg/dL   GFR calc non Af Amer 72 (*)  >90 mL/min   GFR calc Af Amer 83 (*) >90 mL/min   Comment: (NOTE)     The eGFR has been calculated using the CKD EPI equation.     This calculation has not been validated in all clinical situations.     eGFR's persistently <90 mL/min signify possible Chronic Kidney     Disease.  MAGNESIUM     Status: None   Collection Time    10/09/13  8:47 AM      Result Value Range   Magnesium 2.1  1.5 - 2.5 mg/dL  POCT I-STAT TROPONIN I     Status: None   Collection Time    10/09/13  8:59 AM      Result Value Range   Troponin i, poc 0.01  0.00 - 0.08 ng/mL   Comment 3            Comment: Due to the release kinetics of cTnI,     a negative result within the first hours     of the onset of symptoms does not rule out     myocardial infarction with certainty.     If myocardial infarction is still suspected,     repeat the test at appropriate intervals.  TSH     Status: None   Collection Time    10/09/13 10:31 AM      Result Value Range   TSH 0.760  0.350 - 4.500 uIU/mL   Comment: Performed at [facility]  PRO B NATRIURETIC PEPTIDE     Status: Abnormal   Collection Time    10/09/13 11:02 AM      Result Value Range   Pro B Natriuretic peptide (BNP) 268.2 (*) 0 - 125 pg/mL  MRSA PCR SCREENING     Status: None   Collection Time    10/09/13  2:19 PM      Result Value Range   MRSA by PCR NEGATIVE  NEGATIVE   Comment:            The GeneXpert MRSA Assay (FDA     approved for NASAL specimens     only),  is one component of a     comprehensive MRSA colonization     surveillance program. It is not     intended to diagnose MRSA     infection nor to guide or     monitor treatment for     MRSA infections.  POCT ACTIVATED CLOTTING TIME     Status: None   Collection Time    10/09/13  3:59 PM      Result Value Range   Activated Clotting Time 140    SURGICAL PCR SCREEN     Status: None   Collection Time    10/09/13  5:07 PM      Result Value Range   MRSA, PCR NEGATIVE  NEGATIVE    Staphylococcus aureus NEGATIVE  NEGATIVE   Comment:            The Xpert SA Assay (FDA     approved for NASAL specimens     in patients over 72 years of age),     is one component of     a comprehensive surveillance     program.  Test performance has     been validated by [facility] for patients greater     than or equal to 69 year old.     It is not intended     to diagnose infection nor to     guide or monitor treatment.  ABO/RH     Status: None   Collection Time    10/09/13  6:52 PM      Result Value Range   ABO/RH(D) A POS    TROPONIN I     Status: None   Collection Time    10/09/13  7:00 PM      Result Value Range   Troponin I <0.30  <0.30 ng/mL   Comment:            Due to the release kinetics of cTnI,     a negative result within the first hours     of the onset of symptoms does not rule out     myocardial infarction with certainty.     If myocardial infarction is still suspected,     repeat the test at appropriate intervals.  CBC WITH DIFFERENTIAL     Status: None   Collection Time    10/09/13  7:00 PM      Result Value Range   WBC 9.3  4.0 - 10.5 K/uL   RBC 4.94  4.22 - 5.81 MIL/uL   Hemoglobin 16.3  13.0 - 17.0 g/dL   HCT 16.1  09.6 - 04.5 %   MCV 92.9  78.0 - 100.0 fL   MCH 33.0  26.0 - 34.0 pg   MCHC 35.5  30.0 - 36.0 g/dL   RDW 40.9  81.1 - 91.4 %   Platelets 168  150 - 400 K/uL   Neutrophils Relative % 69  43 - 77 %   Neutro Abs 6.4  1.7 - 7.7 K/uL   Lymphocytes Relative 18  12 - 46 %   Lymphs Abs 1.7  0.7 - 4.0 K/uL   Monocytes Relative 11  3 - 12 %   Monocytes Absolute 1.0  0.1 - 1.0 K/uL   Eosinophils Relative 2  0 - 5 %   Eosinophils Absolute 0.2  0.0 - 0.7 K/uL   Basophils Relative 0  0 - 1 %   Basophils Absolute 0.0  0.0 - 0.1  K/uL  COMPREHENSIVE METABOLIC PANEL     Status: Abnormal   Collection Time    10/09/13  7:00 PM      Result Value Range   Sodium 141  135 - 145 mEq/L   Potassium 4.5  3.5 - 5.1 mEq/L   Chloride 106  96 - 112  mEq/L   CO2 27  19 - 32 mEq/L   Glucose, Bld 100 (*) 70 - 99 mg/dL   BUN 11  6 - 23 mg/dL   Creatinine, Ser 1.61  0.50 - 1.35 mg/dL   Calcium 8.9  8.4 - 09.6 mg/dL   Total Protein 6.4  6.0 - 8.3 g/dL   Albumin 3.7  3.5 - 5.2 g/dL   AST 19  0 - 37 U/L   ALT 20  0 - 53 U/L   Alkaline Phosphatase 46  39 - 117 U/L   Total Bilirubin 0.9  0.3 - 1.2 mg/dL   GFR calc non Af Amer 70 (*) >90 mL/min   GFR calc Af Amer 81 (*) >90 mL/min   Comment: (NOTE)     The eGFR has been calculated using the CKD EPI equation.     This calculation has not been validated in all clinical situations.     eGFR's persistently <90 mL/min signify possible Chronic Kidney     Disease.  PROTIME-INR     Status: None   Collection Time    10/09/13  7:00 PM      Result Value Range   Prothrombin Time 14.8  11.6 - 15.2 seconds   INR 1.19  0.00 - 1.49  APTT     Status: Abnormal   Collection Time    10/09/13  7:00 PM      Result Value Range   aPTT 43 (*) 24 - 37 seconds   Comment:            IF BASELINE aPTT IS ELEVATED,     SUGGEST PATIENT RISK ASSESSMENT     BE USED TO DETERMINE APPROPRIATE     ANTICOAGULANT THERAPY.  TYPE AND SCREEN     Status: None   Collection Time    10/09/13  7:01 PM      Result Value Range   ABO/RH(D) A POS     Antibody Screen NEG     Sample Expiration 10/12/2013    URINALYSIS, ROUTINE W REFLEX MICROSCOPIC     Status: Abnormal   Collection Time    10/09/13  7:34 PM      Result Value Range   Color, Urine YELLOW  YELLOW   APPearance CLEAR  CLEAR   Specific Gravity, Urine 1.010  1.005 - 1.030   pH 7.0  5.0 - 8.0   Glucose, UA NEGATIVE  NEGATIVE mg/dL   Hgb urine dipstick NEGATIVE  NEGATIVE   Bilirubin Urine NEGATIVE  NEGATIVE   Ketones, ur 15 (*) NEGATIVE mg/dL   Protein, ur NEGATIVE  NEGATIVE mg/dL   Urobilinogen, UA 0.2  0.0 - 1.0 mg/dL   Nitrite NEGATIVE  NEGATIVE   Leukocytes, UA NEGATIVE  NEGATIVE   Comment: MICROSCOPIC NOT DONE ON URINES WITH NEGATIVE PROTEIN, BLOOD,  LEUKOCYTES, NITRITE, OR GLUCOSE <1000 mg/dL.    Dg Chest Port 1 View  10/09/2013   CLINICAL DATA:  Chest discomfort. Tachycardia.  EXAM: PORTABLE CHEST - 1 VIEW  COMPARISON:  No comparison chest x-ray. Comparison thoracic spine plain film examination 01/26/2009.  FINDINGS: Cardiomegaly.  Mild central pulmonary vascular prominence without pulmonary edema.  Left base subsegmental atelectasis versus linear scarring.  No segmental infiltrate or pneumothorax.  The patient would eventually benefit from two view chest.  IMPRESSION: Cardiomegaly with mild central pulmonary vascular prominence. Please see above.   Electronically Signed   By: Bridgett Larsson M.D.   On: 10/09/2013 09:21    Review of Systems  Constitutional: Negative.   HENT: Negative.   Eyes: Negative.   Respiratory: Negative for shortness of breath.   Cardiovascular: Positive for chest pain. Negative for orthopnea, leg swelling and PND.  Gastrointestinal: Negative.   Genitourinary: Negative.   Musculoskeletal: Negative.   Skin: Negative.   Neurological: Negative.   Endo/Heme/Allergies: Negative.   Psychiatric/Behavioral: Negative.    Blood pressure 119/75, pulse 67, temperature 97.4 F (36.3 C), temperature source Oral, resp. rate 14, height 5\' 8"  (1.727 m), weight 98.6 kg (217 lb 6 oz), SpO2 98.00%. Physical Exam  Constitutional: He appears well-developed and well-nourished. No distress.  HENT:  Head: Normocephalic and atraumatic.  Eyes: EOM are normal. Pupils are equal, round, and reactive to light.  Neck: No JVD present. No thyromegaly present.  Cardiovascular: Normal rate, regular rhythm, normal heart sounds and intact distal pulses.  Exam reveals no gallop and no friction rub.   No murmur heard. Respiratory: Effort normal and breath sounds normal. No respiratory distress. He has no rales.  GI: Soft. Bowel sounds are normal. He exhibits no distension and no mass. There is no tenderness.  Musculoskeletal: Normal range of  motion. He exhibits no edema.  Lymphadenopathy:    He has no cervical adenopathy.  Neurological: He has normal strength. No cranial nerve deficit or sensory deficit.  Skin: Skin is warm and dry.  Psychiatric: He has a normal mood and affect.    Assessment/Plan:  He has high grade LM and multi-vessel coronary artery disease. I agree with the need for urgent CABG. I discussed the operative procedure with the patient and his wife including alternatives, benefits and risks; including but not limited to bleeding, blood transfusion, infection, stroke, myocardial infarction, graft failure, heart block requiring a permanent pacemaker, organ dysfunction, and death.  Alfred Thompson understands and agrees to proceed.  We will schedule surgery for tomorrow morning.  Alfred Thompson K 10/09/2013, 8:16 PM

## 2013-10-09 NOTE — Progress Notes (Signed)
Patient to cath lab.  No complaints.  Heparin off.

## 2013-10-09 NOTE — ED Provider Notes (Signed)
CSN: [id number]     Arrival date & time 10/09/13  4098 History   First MD Initiated Contact with Patient 10/09/13 [phone]     Chief Complaint  Patient presents with  . Chest Pain  . Tachycardia   (Consider location/radiation/quality/duration/timing/severity/associated sxs/prior Treatment) HPI  67 year old male with chest heaviness. Symptom onset around 3:00 this morning while in bed. Mild shortness of breath. Patient felt it may have been indigestion and took 3 TUMS and a baby aspirin without much immediate change. Symptoms have overall gradually improved since onset there. Patient initially said he also had some numbness in his bilateral hands which has since resolved. Patient reports a 3 hour episode of similar symptoms 2 weeks ago which resolved without any intervention. No similar symptoms prior to this. No history of atrial fibrillation or known cardiac disease that he is aware of. Reports a stress test in the 1990s ordered by PCP, DR Alwyn Ren,  which he thinks was unremarkable. Nonsmoker. Denies drug use. No significant alcohol intake. No history of any thyroid disease.   History reviewed. No pertinent past medical history. History reviewed. No pertinent past surgical history. History reviewed. No pertinent family history. History  Substance Use Topics  . Smoking status: Never Smoker   . Smokeless tobacco: Not on file  . Alcohol Use: Yes    Review of Systems  All systems reviewed and negative, other than as noted in HPI.   Allergies  Review of patient's allergies indicates no known allergies.  Home Medications  No current outpatient prescriptions on file. BP 113/78  Pulse 101  Temp(Src) 98.2 F (36.8 C) (Oral)  Resp 32  Ht 5\' 8"  (1.727 m)  Wt 214 lb (97.07 kg)  BMI 32.55 kg/m2  SpO2 91% Physical Exam  Nursing note and vitals reviewed. Constitutional: He appears well-developed and well-nourished. No distress.  HENT:  Head: Normocephalic and atraumatic.  Eyes:  Conjunctivae are normal. Right eye exhibits no discharge. Left eye exhibits no discharge.  Neck: Neck supple.  Cardiovascular: Normal heart sounds.  Exam reveals no gallop and no friction rub.   No murmur heard. Tachycardic with an irregularly irregular rhythm  Pulmonary/Chest: Effort normal and breath sounds normal. No respiratory distress.  Abdominal: Soft. He exhibits no distension. There is no tenderness.  Musculoskeletal: He exhibits no edema and no tenderness.  Symmetric, pitting lower extremity edema. No calf tenderness. Negative Homans.  Neurological: He is alert.  Skin: Skin is warm and dry.  Psychiatric: He has a normal mood and affect. His behavior is normal. Thought content normal.    ED Course  Procedures (including critical care time) Labs Review Labs Reviewed  CBC - Abnormal; Notable for the following:    Hemoglobin 17.3 (*)    MCHC 36.7 (*)    All other components within normal limits  BASIC METABOLIC PANEL - Abnormal; Notable for the following:    Glucose, Bld 150 (*)    GFR calc non Af Amer 72 (*)    GFR calc Af Amer 83 (*)    All other components within normal limits  MAGNESIUM  TSH  POCT I-STAT TROPONIN I   Imaging Review Dg Chest Port 1 View  10/09/2013   CLINICAL DATA:  Chest discomfort. Tachycardia.  EXAM: PORTABLE CHEST - 1 VIEW  COMPARISON:  No comparison chest x-ray. Comparison thoracic spine plain film examination 01/26/2009.  FINDINGS: Cardiomegaly.  Mild central pulmonary vascular prominence without pulmonary edema.  Left base subsegmental atelectasis versus linear scarring.  No segmental infiltrate or  pneumothorax.  The patient would eventually benefit from two view chest.  IMPRESSION: Cardiomegaly with mild central pulmonary vascular prominence. Please see above.   Electronically Signed   By: Bridgett Larsson M.D.   On: 10/09/2013 09:21    EKG Interpretation     Ventricular Rate:  149 PR Interval:  168 QRS Duration: 116 QT Interval:  280 QTC  Calculation: 441 R Axis:   32 Text Interpretation:  Atrial fibrillation w/ rapid ventricular response mild diffuse ST depression, possibly rate related. previous EKG from May 2002 was in sinus rhythm            MDM   1. New onset atrial fibrillation     67 year old male with new onset atrial fibrillation. Rate improved with Cardizem. Suspect that episode of similar symptoms two weeks ago may have been the same. No obvious precipitant. TSH ordered. Some peripheral edema on exam and vascular congestion/cardiomegaly noted on x-ray. Will will obtain a BNP for reference. Will discuss with cardiology.    Raeford Razor, MD 10/13/13 [phone]

## 2013-10-10 ENCOUNTER — Inpatient Hospital Stay (HOSPITAL_COMMUNITY): Payer: Medicare Other

## 2013-10-10 ENCOUNTER — Encounter (HOSPITAL_COMMUNITY): Payer: Self-pay | Admitting: Anesthesiology

## 2013-10-10 ENCOUNTER — Encounter (HOSPITAL_COMMUNITY): Payer: Medicare Other | Admitting: Anesthesiology

## 2013-10-10 ENCOUNTER — Inpatient Hospital Stay (HOSPITAL_COMMUNITY): Payer: Medicare Other | Admitting: Anesthesiology

## 2013-10-10 ENCOUNTER — Encounter (HOSPITAL_COMMUNITY)
Admission: EM | Disposition: A | Discharge status: Home / Self Care | Payer: Medicare Other | Source: Home / Self Care | Attending: Surgery

## 2013-10-10 DIAGNOSIS — Z951 Presence of aortocoronary bypass graft: Secondary | ICD-10-CM

## 2013-10-10 HISTORY — PX: CORONARY ARTERY BYPASS GRAFT: SHX141

## 2013-10-10 HISTORY — DX: Presence of aortocoronary bypass graft: Z95.1

## 2013-10-10 HISTORY — PX: INTRAOPERATIVE TRANSESOPHAGEAL ECHOCARDIOGRAM: SHX5062

## 2013-10-10 LAB — POCT I-STAT 4, (NA,K, GLUC, HGB,HCT)
Glucose, Bld: 104 mg/dL — ABNORMAL HIGH (ref 70–99)
Glucose, Bld: 134 mg/dL — ABNORMAL HIGH (ref 70–99)
Glucose, Bld: 108 mg/dL — ABNORMAL HIGH (ref 70–99)
Glucose, Bld: 109 mg/dL — ABNORMAL HIGH (ref 70–99)
Glucose, Bld: 98 mg/dL (ref 70–99)
HCT: 44 % (ref 39.0–52.0)
HCT: 37 % — ABNORMAL LOW (ref 39.0–52.0)
HCT: 36 % — ABNORMAL LOW (ref 39.0–52.0)
Hemoglobin: 11.2 g/dL — ABNORMAL LOW (ref 13.0–17.0)
Hemoglobin: 15 g/dL (ref 13.0–17.0)
Hemoglobin: 12.6 g/dL — ABNORMAL LOW (ref 13.0–17.0)
Hemoglobin: 12.2 g/dL — ABNORMAL LOW (ref 13.0–17.0)
Potassium: 3.8 mEq/L (ref 3.5–5.1)
Sodium: 142 mEq/L (ref 135–145)
Sodium: 140 mEq/L (ref 135–145)
Sodium: 139 mEq/L (ref 135–145)
Sodium: 141 mEq/L (ref 135–145)

## 2013-10-10 LAB — POCT I-STAT 3, ART BLOOD GAS (G3+)
Acid-base deficit: 4 mmol/L — ABNORMAL HIGH (ref 0.0–2.0)
Bicarbonate: 24.9 mEq/L — ABNORMAL HIGH (ref 20.0–24.0)
Bicarbonate: 23.1 mEq/L (ref 20.0–24.0)
Bicarbonate: 22.3 mEq/L (ref 20.0–24.0)
O2 Saturation: 100 %
O2 Saturation: 100 %
O2 Saturation: 86 %
Patient temperature: 37
TCO2: 26 mmol/L (ref 0–100)
TCO2: 24 mmol/L (ref 0–100)
TCO2: 24 mmol/L (ref 0–100)
TCO2: 25 mmol/L (ref 0–100)
pCO2 arterial: 49.1 mmHg — ABNORMAL HIGH (ref 35.0–45.0)
pCO2 arterial: 46 mmHg — ABNORMAL HIGH (ref 35.0–45.0)
pCO2 arterial: 44.5 mmHg (ref 35.0–45.0)
pCO2 arterial: 38.7 mmHg (ref 35.0–45.0)
pCO2 arterial: 39.6 mmHg (ref 35.0–45.0)
pH, Arterial: 7.314 — ABNORMAL LOW (ref 7.350–7.450)
pH, Arterial: 7.41 (ref 7.350–7.450)
pH, Arterial: 7.362 (ref 7.350–7.450)
pO2, Arterial: 309 mmHg — ABNORMAL HIGH (ref 80.0–100.0)
pO2, Arterial: 62 mmHg — ABNORMAL LOW (ref 80.0–100.0)
pO2, Arterial: 60 mmHg — ABNORMAL LOW (ref 80.0–100.0)

## 2013-10-10 LAB — CREATININE, SERUM
Creatinine, Ser: 1.02 mg/dL (ref 0.50–1.35)
GFR calc Af Amer: 87 mL/min — ABNORMAL LOW (ref >90)
GFR calc non Af Amer: 75 mL/min — ABNORMAL LOW (ref >90)

## 2013-10-10 LAB — CBC
HCT: 36.8 % — ABNORMAL LOW (ref 39.0–52.0)
HCT: 39.3 % (ref 39.0–52.0)
Hemoglobin: 13.9 g/dL (ref 13.0–17.0)
MCH: 32.7 pg (ref 26.0–34.0)
MCHC: 35.4 g/dL (ref 30.0–36.0)
MCV: 92.5 fL (ref 78.0–100.0)
MCV: 91.8 fL (ref 78.0–100.0)
Platelets: 95 10*3/uL — ABNORMAL LOW (ref 150–400)
RBC: 3.98 MIL/uL — ABNORMAL LOW (ref 4.22–5.81)
RBC: 4.28 MIL/uL (ref 4.22–5.81)
RDW: 13.6 % (ref 11.5–15.5)
WBC: 12.8 10*3/uL — ABNORMAL HIGH (ref 4.0–10.5)
WBC: 17 10*3/uL — ABNORMAL HIGH (ref 4.0–10.5)

## 2013-10-10 LAB — HEMOGLOBIN A1C
Hgb A1c MFr Bld: 5.5 % (ref <5.7)
Mean Plasma Glucose: 111 mg/dL (ref <117)

## 2013-10-10 LAB — GLUCOSE, CAPILLARY
Glucose-Capillary: 98 mg/dL (ref 70–99)
Glucose-Capillary: 137 mg/dL — ABNORMAL HIGH (ref 70–99)
Glucose-Capillary: 150 mg/dL — ABNORMAL HIGH (ref 70–99)
Glucose-Capillary: 149 mg/dL — ABNORMAL HIGH (ref 70–99)
Glucose-Capillary: 142 mg/dL — ABNORMAL HIGH (ref 70–99)

## 2013-10-10 LAB — HEMOGLOBIN AND HEMATOCRIT, BLOOD
HCT: 36.3 % — ABNORMAL LOW (ref 39.0–52.0)
Hemoglobin: 13 g/dL (ref 13.0–17.0)

## 2013-10-10 LAB — POCT I-STAT GLUCOSE: Glucose, Bld: 143 mg/dL — ABNORMAL HIGH (ref 70–99)

## 2013-10-10 LAB — APTT: aPTT: 32 seconds (ref 24–37)

## 2013-10-10 LAB — PLATELET COUNT: Platelets: 101 10*3/uL — ABNORMAL LOW (ref 150–400)

## 2013-10-10 LAB — TROPONIN I: Troponin I: 0.45 ng/mL (ref <0.30)

## 2013-10-10 SURGERY — CORONARY ARTERY BYPASS GRAFTING (CABG)
Anesthesia: General | Site: Chest | Wound class: Clean

## 2013-10-10 MED ORDER — LACTATED RINGERS IV SOLN
INTRAVENOUS | Status: DC | PRN
  Administered 2013-10-10 (×2): via INTRAVENOUS

## 2013-10-10 MED ORDER — PROPOFOL 10 MG/ML IV BOLUS
INTRAVENOUS | Status: DC | PRN
  Administered 2013-10-10: 100 mg via INTRAVENOUS

## 2013-10-10 MED ORDER — BISACODYL 10 MG RE SUPP: 10.0000 mg | Freq: Every day | RECTAL | Status: DC

## 2013-10-10 MED ORDER — LACTATED RINGERS IV SOLN
INTRAVENOUS | Status: DC | PRN
  Administered 2013-10-10: 07:00:00 via INTRAVENOUS

## 2013-10-10 MED ORDER — MIDAZOLAM HCL 5 MG/5ML IJ SOLN
INTRAMUSCULAR | Status: DC | PRN
  Administered 2013-10-10: 4 mg via INTRAVENOUS
  Administered 2013-10-10 (×3): 2 mg via INTRAVENOUS
  Administered 2013-10-10 (×2): 3 mg via INTRAVENOUS

## 2013-10-10 MED ORDER — VANCOMYCIN HCL 1000 MG IV SOLR
1000.0000 mg | INTRAVENOUS | Status: DC | PRN
  Administered 2013-10-10: 1500 mg via INTRAVENOUS

## 2013-10-10 MED ORDER — PHENYLEPHRINE HCL 10 MG/ML IJ SOLN
0.0000 ug/min | INTRAVENOUS | Status: DC
  Filled 2013-10-10: qty 2

## 2013-10-10 MED ORDER — DOPAMINE-DEXTROSE 3.2-5 MG/ML-% IV SOLN
3.0000 ug/kg/min | INTRAVENOUS | Status: DC
  Administered 2013-10-10: 3 ug/kg/min via INTRAVENOUS

## 2013-10-10 MED ORDER — ONDANSETRON HCL 4 MG/2ML IJ SOLN: 4.0000 mg | Freq: Four times a day (QID) | INTRAMUSCULAR | Status: DC | PRN

## 2013-10-10 MED ORDER — MORPHINE SULFATE 2 MG/ML IJ SOLN
2.0000 mg | INTRAMUSCULAR | Status: DC | PRN
  Administered 2013-10-11: 4 mg via INTRAVENOUS
  Filled 2013-10-10: qty 1
  Filled 2013-10-10 (×2): qty 2

## 2013-10-10 MED ORDER — ACETAMINOPHEN 160 MG/5ML PO SOLN: 1000.0000 mg | Freq: Four times a day (QID) | ORAL | Status: DC

## 2013-10-10 MED ORDER — ALBUMIN HUMAN 5 % IV SOLN
INTRAVENOUS | Status: DC | PRN
  Administered 2013-10-10: 12:00:00 via INTRAVENOUS

## 2013-10-10 MED ORDER — SODIUM CHLORIDE 0.9 % IV SOLN
10.0000 g | INTRAVENOUS | Status: DC | PRN
  Administered 2013-10-10: 5 g/h via INTRAVENOUS

## 2013-10-10 MED ORDER — POTASSIUM CHLORIDE 10 MEQ/50ML IV SOLN
10.0000 meq | INTRAVENOUS | Status: AC
  Administered 2013-10-10 – 2013-10-11 (×3): 10 meq via INTRAVENOUS

## 2013-10-10 MED ORDER — LACTATED RINGERS IV SOLN: INTRAVENOUS | Status: DC

## 2013-10-10 MED ORDER — ROCURONIUM BROMIDE 100 MG/10ML IV SOLN
INTRAVENOUS | Status: DC | PRN
  Administered 2013-10-10: 20 mg via INTRAVENOUS
  Administered 2013-10-10: 50 mg via INTRAVENOUS
  Administered 2013-10-10 (×2): 30 mg via INTRAVENOUS
  Administered 2013-10-10: 100 mg via INTRAVENOUS

## 2013-10-10 MED ORDER — INSULIN REGULAR BOLUS VIA INFUSION
0.0000 [IU] | Freq: Three times a day (TID) | INTRAVENOUS | Status: DC
  Administered 2013-10-11: 5 [IU] via INTRAVENOUS
  Filled 2013-10-10: qty 10

## 2013-10-10 MED ORDER — NITROGLYCERIN IN D5W 200-5 MCG/ML-% IV SOLN
INTRAVENOUS | Status: DC | PRN
  Administered 2013-10-10: 5 ug/min via INTRAVENOUS

## 2013-10-10 MED ORDER — DEXTROSE 5 % IV SOLN
1.5000 g | Freq: Two times a day (BID) | INTRAVENOUS | Status: AC
  Administered 2013-10-10 – 2013-10-12 (×4): 1.5 g via INTRAVENOUS
  Filled 2013-10-10 (×4): qty 1.5

## 2013-10-10 MED ORDER — BISACODYL 5 MG PO TBEC
10.0000 mg | DELAYED_RELEASE_TABLET | Freq: Every day | ORAL | Status: DC
  Administered 2013-10-11: 10 mg via ORAL
  Filled 2013-10-10: qty 2

## 2013-10-10 MED ORDER — NOREPINEPHRINE BITARTRATE 1 MG/ML IJ SOLN
2.0000 ug/min | INTRAVENOUS | Status: DC
  Filled 2013-10-10 (×2): qty 4

## 2013-10-10 MED ORDER — SODIUM CHLORIDE 0.9 % IJ SOLN: 3.0000 mL | INTRAMUSCULAR | Status: DC | PRN

## 2013-10-10 MED ORDER — DOCUSATE SODIUM 100 MG PO CAPS
200.0000 mg | ORAL_CAPSULE | Freq: Every day | ORAL | Status: DC
  Administered 2013-10-11: 200 mg via ORAL
  Filled 2013-10-10: qty 2

## 2013-10-10 MED ORDER — SODIUM CHLORIDE 0.9 % IV SOLN: 250.0000 mL | INTRAVENOUS | Status: DC

## 2013-10-10 MED ORDER — LACTATED RINGERS IV SOLN: 500.0000 mL | Freq: Once | INTRAVENOUS | Status: AC | PRN

## 2013-10-10 MED ORDER — METOCLOPRAMIDE HCL 5 MG/ML IJ SOLN
10.0000 mg | Freq: Four times a day (QID) | INTRAMUSCULAR | Status: AC
  Administered 2013-10-10 – 2013-10-11 (×3): 10 mg via INTRAVENOUS
  Filled 2013-10-10 (×3): qty 2

## 2013-10-10 MED ORDER — MAGNESIUM SULFATE 40 MG/ML IJ SOLN
4.0000 g | Freq: Once | INTRAMUSCULAR | Status: AC
  Administered 2013-10-10: 4 g via INTRAVENOUS
  Filled 2013-10-10: qty 100

## 2013-10-10 MED ORDER — FENTANYL CITRATE 0.05 MG/ML IJ SOLN
INTRAMUSCULAR | Status: DC | PRN
  Administered 2013-10-10 (×2): 250 ug via INTRAVENOUS
  Administered 2013-10-10: 50 ug via INTRAVENOUS
  Administered 2013-10-10: 250 ug via INTRAVENOUS
  Administered 2013-10-10: 700 ug via INTRAVENOUS

## 2013-10-10 MED ORDER — SODIUM CHLORIDE 0.45 % IV SOLN
INTRAVENOUS | Status: DC
  Administered 2013-10-10: 14:00:00 via INTRAVENOUS

## 2013-10-10 MED ORDER — ALBUMIN HUMAN 5 % IV SOLN: 250.0000 mL | INTRAVENOUS | Status: AC | PRN

## 2013-10-10 MED ORDER — ACETAMINOPHEN 500 MG PO TABS
1000.0000 mg | ORAL_TABLET | Freq: Four times a day (QID) | ORAL | Status: DC
  Administered 2013-10-10 – 2013-10-12 (×6): 1000 mg via ORAL
  Filled 2013-10-10 (×10): qty 2

## 2013-10-10 MED ORDER — DEXMEDETOMIDINE HCL IN NACL 200 MCG/50ML IV SOLN
0.1000 ug/kg/h | INTRAVENOUS | Status: DC
  Administered 2013-10-10 (×2): 0.7 ug/kg/h via INTRAVENOUS
  Filled 2013-10-10 (×2): qty 50

## 2013-10-10 MED ORDER — VANCOMYCIN HCL IN DEXTROSE 1-5 GM/200ML-% IV SOLN
1000.0000 mg | Freq: Once | INTRAVENOUS | Status: AC
Start: 2013-10-10 — End: 2013-10-10
  Administered 2013-10-10: 1000 mg via INTRAVENOUS
  Filled 2013-10-10: qty 200

## 2013-10-10 MED ORDER — FAMOTIDINE IN NACL 20-0.9 MG/50ML-% IV SOLN
20.0000 mg | Freq: Two times a day (BID) | INTRAVENOUS | Status: AC
  Administered 2013-10-10 (×2): 20 mg via INTRAVENOUS
  Filled 2013-10-10: qty 50

## 2013-10-10 MED ORDER — DOPAMINE-DEXTROSE 1.6-5 MG/ML-% IV SOLN
INTRAVENOUS | Status: DC | PRN
  Administered 2013-10-10: 3 ug/kg/min via INTRAVENOUS

## 2013-10-10 MED ORDER — SODIUM CHLORIDE 0.9 % IV SOLN
INTRAVENOUS | Status: DC
  Administered 2013-10-10: 14:00:00 via INTRAVENOUS

## 2013-10-10 MED ORDER — NOREPINEPHRINE BITARTRATE 1 MG/ML IJ SOLN
2.0000 ug/min | INTRAVENOUS | Status: DC
  Administered 2013-10-11: 2 ug/min via INTRAVENOUS
  Filled 2013-10-10 (×2): qty 4

## 2013-10-10 MED ORDER — METOPROLOL TARTRATE 12.5 MG HALF TABLET: 12.5000 mg | ORAL_TABLET | Freq: Two times a day (BID) | ORAL | Status: DC

## 2013-10-10 MED ORDER — ASPIRIN 81 MG PO CHEW: 324.0000 mg | CHEWABLE_TABLET | Freq: Every day | ORAL | Status: DC

## 2013-10-10 MED ORDER — SODIUM BICARBONATE 8.4 % IV SOLN
INTRAVENOUS | Status: DC | PRN
  Administered 2013-10-10: 25 meq via INTRAVENOUS
  Administered 2013-10-10: 50 meq

## 2013-10-10 MED ORDER — SODIUM CHLORIDE 0.9 % IV SOLN
INTRAVENOUS | Status: DC
  Filled 2013-10-10: qty 1

## 2013-10-10 MED ORDER — PHENYLEPHRINE HCL 10 MG/ML IJ SOLN
20.0000 mg | INTRAVENOUS | Status: DC | PRN
  Administered 2013-10-10: 80 ug/min via INTRAVENOUS

## 2013-10-10 MED ORDER — DEXMEDETOMIDINE HCL IN NACL 200 MCG/50ML IV SOLN
INTRAVENOUS | Status: DC | PRN
  Administered 2013-10-10: .3 ug/kg/h via INTRAVENOUS

## 2013-10-10 MED ORDER — SODIUM CHLORIDE 0.9 % IV SOLN
100.0000 [IU] | INTRAVENOUS | Status: DC | PRN
  Administered 2013-10-10: 1.6 [IU]/h via INTRAVENOUS

## 2013-10-10 MED ORDER — DEXTROSE 5 % IV SOLN
1.5000 g | INTRAVENOUS | Status: DC | PRN
  Administered 2013-10-10: .75 g via INTRAVENOUS
  Administered 2013-10-10: 1.5 g via INTRAVENOUS

## 2013-10-10 MED ORDER — METOCLOPRAMIDE HCL 5 MG/ML IJ SOLN
10.0000 mg | Freq: Four times a day (QID) | INTRAMUSCULAR | Status: DC
  Administered 2013-10-10: 10 mg via INTRAVENOUS
  Filled 2013-10-10 (×4): qty 2

## 2013-10-10 MED ORDER — GLYCOPYRROLATE 0.2 MG/ML IJ SOLN
INTRAMUSCULAR | Status: DC | PRN
  Administered 2013-10-10 (×2): 0.4 mg via INTRAVENOUS

## 2013-10-10 MED ORDER — ACETAMINOPHEN 160 MG/5ML PO SOLN
650.0000 mg | Freq: Once | ORAL | Status: AC
Start: 2013-10-10 — End: 2013-10-10

## 2013-10-10 MED ORDER — MORPHINE SULFATE 2 MG/ML IJ SOLN
1.0000 mg | INTRAMUSCULAR | Status: AC | PRN
  Administered 2013-10-10 (×4): 2 mg via INTRAVENOUS
  Filled 2013-10-10: qty 2

## 2013-10-10 MED ORDER — NITROGLYCERIN IN D5W 200-5 MCG/ML-% IV SOLN: 0.0000 ug/min | INTRAVENOUS | Status: DC

## 2013-10-10 MED ORDER — ACETAMINOPHEN 650 MG RE SUPP
650.0000 mg | Freq: Once | RECTAL | Status: AC
Start: 2013-10-10 — End: 2013-10-10
  Administered 2013-10-10: 650 mg via RECTAL

## 2013-10-10 MED ORDER — 0.9 % SODIUM CHLORIDE (POUR BTL) OPTIME
TOPICAL | Status: DC | PRN
  Administered 2013-10-10: 1000 mL

## 2013-10-10 MED ORDER — CALCIUM CHLORIDE 10 % IV SOLN
INTRAVENOUS | Status: DC | PRN
  Administered 2013-10-10: .1 g via INTRAVENOUS
  Administered 2013-10-10: 0.5 g via INTRAVENOUS

## 2013-10-10 MED ORDER — METOPROLOL TARTRATE 25 MG/10 ML ORAL SUSPENSION: 12.5000 mg | Freq: Two times a day (BID) | ORAL | Status: DC

## 2013-10-10 MED ORDER — ASPIRIN EC 325 MG PO TBEC
325.0000 mg | DELAYED_RELEASE_TABLET | Freq: Every day | ORAL | Status: DC
  Administered 2013-10-11: 325 mg via ORAL
  Filled 2013-10-10 (×2): qty 1

## 2013-10-10 MED ORDER — PLASMA-LYTE 148 IV SOLN
INTRAVENOUS | Status: DC | PRN
  Administered 2013-10-10: 08:00:00 via INTRAVASCULAR

## 2013-10-10 MED ORDER — POTASSIUM CHLORIDE 10 MEQ/50ML IV SOLN
10.0000 meq | INTRAVENOUS | Status: AC
  Administered 2013-10-10 (×3): 10 meq via INTRAVENOUS

## 2013-10-10 MED ORDER — METOPROLOL TARTRATE 1 MG/ML IV SOLN: 2.5000 mg | INTRAVENOUS | Status: DC | PRN

## 2013-10-10 MED ORDER — THROMBIN 20000 UNITS EX SOLR
CUTANEOUS | Status: AC
  Filled 2013-10-10: qty 20000

## 2013-10-10 MED ORDER — THROMBIN 20000 UNITS EX SOLR
OROMUCOSAL | Status: DC | PRN
  Administered 2013-10-10: 08:00:00 via TOPICAL

## 2013-10-10 MED ORDER — HEPARIN SODIUM (PORCINE) 1000 UNIT/ML IJ SOLN
INTRAMUSCULAR | Status: DC | PRN
  Administered 2013-10-10: 30000 [IU] via INTRAVENOUS

## 2013-10-10 MED ORDER — SODIUM CHLORIDE 0.9 % IJ SOLN
3.0000 mL | Freq: Two times a day (BID) | INTRAMUSCULAR | Status: DC
  Administered 2013-10-11: 10:00:00 via INTRAVENOUS
  Administered 2013-10-11: 3 mL via INTRAVENOUS

## 2013-10-10 MED ORDER — SIMVASTATIN 20 MG PO TABS
20.0000 mg | ORAL_TABLET | Freq: Every day | ORAL | Status: DC
  Administered 2013-10-11: 20 mg via ORAL
  Filled 2013-10-10 (×3): qty 1

## 2013-10-10 MED ORDER — MIDAZOLAM HCL 2 MG/2ML IJ SOLN: 2.0000 mg | INTRAMUSCULAR | Status: DC | PRN

## 2013-10-10 MED ORDER — LIDOCAINE HCL (CARDIAC) 20 MG/ML IV SOLN
INTRAVENOUS | Status: DC | PRN
  Administered 2013-10-10: 100 mg via INTRAVENOUS

## 2013-10-10 MED ORDER — NOREPINEPHRINE BITARTRATE 1 MG/ML IJ SOLN
4000.0000 ug | INTRAMUSCULAR | Status: DC | PRN
  Administered 2013-10-10: 2 ug/min via INTRAVENOUS

## 2013-10-10 MED ORDER — PANTOPRAZOLE SODIUM 40 MG PO TBEC: 40.0000 mg | DELAYED_RELEASE_TABLET | Freq: Every day | ORAL | Status: DC

## 2013-10-10 MED ORDER — HEMOSTATIC AGENTS (NO CHARGE) OPTIME
TOPICAL | Status: DC | PRN
  Administered 2013-10-10: 1 via TOPICAL

## 2013-10-10 MED ORDER — OXYCODONE HCL 5 MG PO TABS
5.0000 mg | ORAL_TABLET | ORAL | Status: DC | PRN
  Administered 2013-10-11 – 2013-10-12 (×6): 5 mg via ORAL
  Filled 2013-10-10 (×6): qty 1

## 2013-10-10 SURGICAL SUPPLY — 100 items
ATTRACTOMAT 16X20 MAGNETIC DRP (DRAPES) ×3 IMPLANT
BAG DECANTER FOR FLEXI CONT (MISCELLANEOUS) ×3 IMPLANT
BANDAGE ELASTIC 4 VELCRO ST LF (GAUZE/BANDAGES/DRESSINGS) ×3 IMPLANT
BANDAGE ELASTIC 6 VELCRO ST LF (GAUZE/BANDAGES/DRESSINGS) ×3 IMPLANT
BANDAGE GAUZE ELAST BULKY 4 IN (GAUZE/BANDAGES/DRESSINGS) ×3 IMPLANT
BASKET HEART (ORDER IN 25'S) (MISCELLANEOUS) ×1
BASKET HEART (ORDER IN 25S) (MISCELLANEOUS) ×2 IMPLANT
BLADE STERNUM SYSTEM 6 (BLADE) ×3 IMPLANT
BLADE SURG 11 STRL SS (BLADE) ×3 IMPLANT
CANISTER SUCTION 2500CC (MISCELLANEOUS) ×3 IMPLANT
CANNULA VENOUS LOW PROF 34X46 (CANNULA) ×3 IMPLANT
CANNULA VESSEL W/WING W/VALVE (CANNULA) ×3 IMPLANT
CATH ROBINSON RED A/P 18FR (CATHETERS) ×6 IMPLANT
CATH THORACIC 28FR (CATHETERS) ×3 IMPLANT
CATH THORACIC 28FR RT ANG (CATHETERS) IMPLANT
CATH THORACIC 36FR (CATHETERS) ×3 IMPLANT
CATH THORACIC 36FR RT ANG (CATHETERS) ×3 IMPLANT
CLIP TI MEDIUM 24 (CLIP) IMPLANT
CLIP TI WIDE RED SMALL 24 (CLIP) IMPLANT
COVER SURGICAL LIGHT HANDLE (MISCELLANEOUS) ×3 IMPLANT
CRADLE DONUT ADULT HEAD (MISCELLANEOUS) ×3 IMPLANT
DERMABOND ADVANCED (GAUZE/BANDAGES/DRESSINGS) ×1
DERMABOND ADVANCED .7 DNX12 (GAUZE/BANDAGES/DRESSINGS) ×2 IMPLANT
DRAPE CARDIOVASCULAR INCISE (DRAPES) ×1
DRAPE SLUSH/WARMER DISC (DRAPES) IMPLANT
DRAPE SRG 135X102X78XABS (DRAPES) ×2 IMPLANT
DRSG COVADERM 4X14 (GAUZE/BANDAGES/DRESSINGS) ×3 IMPLANT
ELECT CAUTERY BLADE 6.4 (BLADE) ×3 IMPLANT
ELECT REM PT RETURN 9FT ADLT (ELECTROSURGICAL) ×6
ELECTRODE REM PT RTRN 9FT ADLT (ELECTROSURGICAL) ×4 IMPLANT
GLOVE BIO SURGEON STRL SZ 6 (GLOVE) ×12 IMPLANT
GLOVE BIO SURGEON STRL SZ 6.5 (GLOVE) ×18 IMPLANT
GLOVE BIO SURGEON STRL SZ7 (GLOVE) IMPLANT
GLOVE BIO SURGEON STRL SZ7.5 (GLOVE) IMPLANT
GLOVE BIOGEL PI IND STRL 6 (GLOVE) IMPLANT
GLOVE BIOGEL PI IND STRL 6.5 (GLOVE) IMPLANT
GLOVE BIOGEL PI IND STRL 7.0 (GLOVE) ×6 IMPLANT
GLOVE BIOGEL PI INDICATOR 6 (GLOVE)
GLOVE BIOGEL PI INDICATOR 6.5 (GLOVE)
GLOVE BIOGEL PI INDICATOR 7.0 (GLOVE) ×3
GLOVE EUDERMIC 7 POWDERFREE (GLOVE) ×6 IMPLANT
GLOVE ORTHO TXT STRL SZ7.5 (GLOVE) IMPLANT
GOWN PREVENTION PLUS XLARGE (GOWN DISPOSABLE) ×3 IMPLANT
GOWN STRL NON-REIN LRG LVL3 (GOWN DISPOSABLE) ×21 IMPLANT
HEMOSTAT POWDER SURGIFOAM 1G (HEMOSTASIS) ×9 IMPLANT
HEMOSTAT SURGICEL 2X14 (HEMOSTASIS) ×3 IMPLANT
INSERT FOGARTY 61MM (MISCELLANEOUS) IMPLANT
INSERT FOGARTY XLG (MISCELLANEOUS) IMPLANT
KIT BASIN OR (CUSTOM PROCEDURE TRAY) ×3 IMPLANT
KIT CATH CPB BARTLE (MISCELLANEOUS) ×3 IMPLANT
KIT ROOM TURNOVER OR (KITS) ×3 IMPLANT
KIT SUCTION CATH 14FR (SUCTIONS) ×3 IMPLANT
KIT VASOVIEW W/TROCAR VH 2000 (KITS) ×3 IMPLANT
NS IRRIG 1000ML POUR BTL (IV SOLUTION) ×18 IMPLANT
PACK OPEN HEART (CUSTOM PROCEDURE TRAY) ×3 IMPLANT
PAD ARMBOARD 7.5X6 YLW CONV (MISCELLANEOUS) ×6 IMPLANT
PAD ELECT DEFIB RADIOL ZOLL (MISCELLANEOUS) ×3 IMPLANT
PENCIL BUTTON HOLSTER BLD 10FT (ELECTRODE) ×3 IMPLANT
PUNCH AORTIC ROTATE 4.0MM (MISCELLANEOUS) IMPLANT
PUNCH AORTIC ROTATE 4.5MM 8IN (MISCELLANEOUS) ×3 IMPLANT
PUNCH AORTIC ROTATE 5MM 8IN (MISCELLANEOUS) IMPLANT
SET CARDIOPLEGIA MPS 5001102 (MISCELLANEOUS) ×3 IMPLANT
SPONGE GAUZE 4X4 12PLY (GAUZE/BANDAGES/DRESSINGS) ×9 IMPLANT
SPONGE INTESTINAL PEANUT (DISPOSABLE) IMPLANT
SPONGE LAP 18X18 X RAY DECT (DISPOSABLE) ×3 IMPLANT
SPONGE LAP 4X18 X RAY DECT (DISPOSABLE) ×6 IMPLANT
SUT BONE WAX W31G (SUTURE) ×3 IMPLANT
SUT MNCRL AB 4-0 PS2 18 (SUTURE) ×6 IMPLANT
SUT PROLENE 3 0 SH DA (SUTURE) IMPLANT
SUT PROLENE 3 0 SH1 36 (SUTURE) ×3 IMPLANT
SUT PROLENE 4 0 RB 1 (SUTURE)
SUT PROLENE 4 0 SH DA (SUTURE) IMPLANT
SUT PROLENE 4-0 RB1 .5 CRCL 36 (SUTURE) IMPLANT
SUT PROLENE 5 0 C 1 36 (SUTURE) IMPLANT
SUT PROLENE 6 0 C 1 30 (SUTURE) ×6 IMPLANT
SUT PROLENE 7 0 BV 1 (SUTURE) IMPLANT
SUT PROLENE 7 0 BV1 MDA (SUTURE) ×9 IMPLANT
SUT PROLENE 8 0 BV175 6 (SUTURE) IMPLANT
SUT SILK  1 MH (SUTURE)
SUT SILK 1 MH (SUTURE) IMPLANT
SUT STEEL STERNAL CCS#1 18IN (SUTURE) IMPLANT
SUT STEEL SZ 6 DBL 3X14 BALL (SUTURE) IMPLANT
SUT VIC AB 1 CTX 36 (SUTURE) ×3
SUT VIC AB 1 CTX36XBRD ANBCTR (SUTURE) ×6 IMPLANT
SUT VIC AB 2-0 CT1 27 (SUTURE) ×1
SUT VIC AB 2-0 CT1 TAPERPNT 27 (SUTURE) ×2 IMPLANT
SUT VIC AB 2-0 CTX 27 (SUTURE) IMPLANT
SUT VIC AB 3-0 SH 27 (SUTURE) ×1
SUT VIC AB 3-0 SH 27X BRD (SUTURE) ×2 IMPLANT
SUT VIC AB 3-0 X1 27 (SUTURE) IMPLANT
SUT VICRYL 4-0 PS2 18IN ABS (SUTURE) IMPLANT
SUTURE E-PAK OPEN HEART (SUTURE) ×3 IMPLANT
SYSTEM SAHARA CHEST DRAIN ATS (WOUND CARE) ×3 IMPLANT
TAPE CLOTH SURG 4X10 WHT LF (GAUZE/BANDAGES/DRESSINGS) ×3 IMPLANT
TOWEL OR 17X24 6PK STRL BLUE (TOWEL DISPOSABLE) ×3 IMPLANT
TOWEL OR 17X26 10 PK STRL BLUE (TOWEL DISPOSABLE) ×3 IMPLANT
TRAY FOLEY IC TEMP SENS 14FR (CATHETERS) ×3 IMPLANT
TUBING INSUFFLATION 10FT LAP (TUBING) ×3 IMPLANT
UNDERPAD 30X30 INCONTINENT (UNDERPADS AND DIAPERS) ×3 IMPLANT
WATER STERILE IRR 1000ML POUR (IV SOLUTION) ×6 IMPLANT

## 2013-10-10 NOTE — Anesthesia Postprocedure Evaluation (Signed)
  Anesthesia Post-op Note  Patient: Alfred Thompson  Procedure(s) Performed: Procedure(s) with comments: CORONARY ARTERY BYPASS GRAFTING (CABG) (N/A) - Times 4 using left internal mammary artery and endoscopically harvested left saphenous vein INTRAOPERATIVE TRANSESOPHAGEAL ECHOCARDIOGRAM (N/A)  Patient Location: SICU  Anesthesia Type:General  Level of Consciousness: sedated and Patient remains intubated per anesthesia plan  Airway and Oxygen Therapy: Patient remains intubated per anesthesia plan and Patient placed on Ventilator (see vital sign flow sheet for setting)  Post-op Pain: none  Post-op Assessment: Post-op Vital signs reviewed, Patient's Cardiovascular Status Stable, Respiratory Function Stable, Patent Airway, No signs of Nausea or vomiting and Pain level controlled  Post-op Vital Signs: stable  Complications: No apparent anesthesia complications

## 2013-10-10 NOTE — Brief Op Note (Signed)
10/09/2013 - 10/10/2013  11:21 AM  PATIENT:  Alfred Thompson  67 y.o. male  PRE-OPERATIVE DIAGNOSIS:  Coronary Artery Disease (Left main disease included)  POST-OPERATIVE DIAGNOSIS:  Coronary Artery Disease (Left main disease included)  PROCEDURE:  INTRAOPERATIVE TRANSESOPHAGEAL ECHOCARDIOGRAM, URGENT CORONARY ARTERY BYPASS GRAFTING (CABG) x 4 (LIMA to LAD, SVG to OM, SVG to RAMUS INTERMEDIATE, and SVG to RCA) with EVH from the left thigh and lower legand open harvest of the left internal mammary artery  SURGEON:  Surgeon(s) and Role:    * Alleen Borne, MD - Primary  PHYSICIAN ASSISTANT: Doree Fudge PA-C   ANESTHESIA:   general  EBL:  Total I/O In: 1000 [I.V.:1000] Out: 400 [Urine:400]   DRAINS: Chest Tube(s) in the Mediastinal and pleural spaces   COUNTS CORRECT:  YES  DICTATION: .Dragon Dictation  PLAN OF CARE: Admit to inpatient   PATIENT DISPOSITION:  ICU - intubated and hemodynamically stable.   Delay start of Pharmacological VTE agent (>24hrs) due to surgical blood loss or risk of bleeding: yes  PRE OP WEIGHT: 99 kg

## 2013-10-10 NOTE — Progress Notes (Signed)
Respiratory therapy note-Increased fio2 70% after two rounds of low sp02 88%. Currently sp02 97%, cont to monitor and wean as tolerated.

## 2013-10-10 NOTE — Anesthesia Preprocedure Evaluation (Signed)
Anesthesia Evaluation  Patient identified by MRN, date of birth, ID band Patient awake    Reviewed: Allergy & Precautions, H&P , NPO status , Patient's Chart, lab work & pertinent test results  Airway       Dental   Pulmonary          Cardiovascular + angina + CAD + dysrhythmias Atrial Fibrillation     Neuro/Psych    GI/Hepatic   Endo/Other    Renal/GU      Musculoskeletal   Abdominal   Peds  Hematology   Anesthesia Other Findings   Reproductive/Obstetrics                           Anesthesia Physical Anesthesia Plan  ASA: III  Anesthesia Plan: General   Post-op Pain Management:    Induction: Intravenous  Airway Management Planned: Oral ETT  Additional Equipment: TEE, PA Cath, CVP and Arterial line  Intra-op Plan:   Post-operative Plan: Post-operative intubation/ventilation  Informed Consent: I have reviewed the patients History and Physical, chart, labs and discussed the procedure including the risks, benefits and alternatives for the proposed anesthesia with the patient or authorized representative who has indicated his/her understanding and acceptance.     Plan Discussed with: CRNA, Anesthesiologist and Surgeon  Anesthesia Plan Comments:         Anesthesia Quick Evaluation

## 2013-10-10 NOTE — Progress Notes (Signed)
CRITICAL VALUE ALERT  Critical value received:  Troponin 0.45  Date of notification:  10/10/2013  Time of notification:  0300  Critical value read back: Yes  Nurse who received alert:  [place] Jonelle Bann RN  MD notified (1st page):  Dr. [place] Callas  Time of first page:  0500    Responding MD:  Dr. Ironton Callas  Time MD responded:  [phone]

## 2013-10-10 NOTE — Anesthesia Procedure Notes (Signed)
Procedure Name: Intubation Date/Time: 10/10/2013 7:49 AM Performed by: Coralee Rud Pre-anesthesia Checklist: Patient identified, Emergency Drugs available, Suction available and Patient being monitored Patient Re-evaluated:Patient Re-evaluated prior to inductionOxygen Delivery Method: Circle system utilized Preoxygenation: Pre-oxygenation with 100% oxygen Intubation Type: IV induction Ventilation: Mask ventilation without difficulty and Oral airway inserted - appropriate to patient size Laryngoscope Size: Miller and 3 Grade View: Grade II Tube type: Oral Tube size: 8.0 mm Number of attempts: 1 Airway Equipment and Method: Stylet and Bougie stylet (Bougie not necessary but did facilatate intubation.) Placement Confirmation: ETT inserted through vocal cords under direct vision,  positive ETCO2 and breath sounds checked- equal and bilateral Secured at: 22 cm Tube secured with: Tape Dental Injury: Teeth and Oropharynx as per pre-operative assessment

## 2013-10-10 NOTE — Transfer of Care (Signed)
Immediate Anesthesia Transfer of Care Note  Patient: Alfred Thompson  Procedure(s) Performed: Procedure(s) with comments: CORONARY ARTERY BYPASS GRAFTING (CABG) (N/A) - Times 4 using left internal mammary artery and endoscopically harvested left saphenous vein INTRAOPERATIVE TRANSESOPHAGEAL ECHOCARDIOGRAM (N/A)  Patient Location: SICU  Anesthesia Type:General  Level of Consciousness: Patient remains intubated per anesthesia plan  Airway & Oxygen Therapy: Patient remains intubated per anesthesia plan and Patient placed on Ventilator (see vital sign flow sheet for setting)  Post-op Assessment: Report given to PACU RN and Post -op Vital signs reviewed and stable  Post vital signs: Reviewed and stable  Complications: No apparent anesthesia complications

## 2013-10-10 NOTE — Op Note (Signed)
CARDIOVASCULAR SURGERY OPERATIVE NOTE  10/10/2013  Surgeon:  Alleen Borne, MD  First Assistant: Doree Fudge,  PA-C   Preoperative Diagnosis:  Severe left main and multi-vessel coronary artery disease   Postoperative Diagnosis:  Same   Procedure:  1. Median Sternotomy 2. Extracorporeal circulation 3.   Coronary artery bypass grafting x 4   Left internal mammary graft to the LAD  SVG to OM  SVG to Ramus  SVG to RCA 4.   Endoscopic vein harvest from the left leg   Anesthesia:  General Endotracheal   Clinical History/Surgical Indication:  67 y/o male with no history of CAD or chest pain. No history of arrythmia. He says he had a stress test several years ago. He was a runner up until about 15 yrs ago. He says he stopped running due to ankle injury but kept eating. Last night he developed an uncomfortable feeling in his chest that persisted after taking antacids. He then developed some discomfort in his arms and asked his wife to bring him to the ER. He arrived here around 8:30 am. He was noted to be in rapid AF. His chest pain subsided with rate control after receiving Diltiazem. His Diltiazem had to be stopped secondary to low B/P with a systolic B/P in the 90s on 5 mg/hr. He remains in AF but is currently pain free. He does admit to a similar episode last weekend that lasted 3 hrs and resolved spontaneously. Cath today shows 80% distal LM, high grade proximal RCA napkin ring lesion, and high grade LAD/diag with good LV function. He has been free of chest discomfort since his rate has been controlled. He has high grade LM and multi-vessel coronary artery disease. I agree with the need for urgent CABG. I discussed the operative procedure with the patient and his wife including alternatives, benefits and risks; including but not limited to bleeding, blood transfusion, infection,  stroke, myocardial infarction, graft failure, heart block requiring a permanent pacemaker, organ dysfunction, and death. Jonna Coup understands and agrees to proceed.     Preparation:  The patient was seen in the preoperative holding area and the correct patient, correct operation were confirmed with the patient after reviewing the medical record and catheterization. The consent was signed by me. Preoperative antibiotics were given. A pulmonary arterial line and radial arterial line were placed by the anesthesia team. The patient was taken back to the operating room and positioned supine on the operating room table. After being placed under general endotracheal anesthesia by the anesthesia team a foley catheter was placed. The neck, chest, abdomen, and both legs were prepped with betadine soap and solution and draped in the usual sterile manner. A surgical time-out was taken and the correct patient and operative procedure were confirmed with the nursing and anesthesia staff.  TEE:  Performed by Dr. Diamantina Monks. This showed normal LV function without wall motion abnormalities. There was trace MR.  Cardiopulmonary Bypass:  A median sternotomy was performed. The pericardium was opened in the midline. Right ventricular function appeared normal. The ascending aorta was of normal size and had no palpable plaque. There were no contraindications to aortic cannulation or cross-clamping. The patient was fully systemically heparinized and the ACT was maintained > 400 sec. The proximal aortic arch was cannulated with a 20 F aortic cannula for arterial inflow. Venous cannulation was performed via the right atrial appendage using a two-staged venous cannula. An antegrade cardioplegia/vent cannula was inserted into the mid-ascending aorta. Aortic occlusion  was performed with a single cross-clamp. Systemic cooling to 32 degrees Centigrade and topical cooling of the heart with iced saline were used. Hyperkalemic  antegrade cold blood cardioplegia was used to induce diastolic arrest and was then given at about 20 minute intervals throughout the period of arrest to maintain myocardial temperature at or below 10 degrees centigrade. A temperature probe was inserted into the interventricular septum and an insulating pad was placed in the pericardium.   Left internal mammary harvest:  The left side of the sternum was retracted using the Rultract retractor. The left internal mammary artery was harvested as a pedicle graft. All side branches were clipped. It was a medium-sized vessel of good quality with excellent blood flow. It was ligated distally and divided. It was sprayed with topical papaverine solution to prevent vasospasm.   Endoscopic vein harvest:  We initially examined the right greater saphenous vein adjacent to the right knee but it was small and therefore not harvested.  The left greater saphenous vein was harvested endoscopically through a 2 cm incision medial to the left knee. It was harvested from the upper thigh to below the knee. It was a medium-sized vein of good quality. The side branches were all ligated with 4-0 silk ties.    Coronary arteries:  The coronary arteries were examined.   LAD:  Diffusely diseased. The diagonal was small and not graftible.  LCX:  Ramus was intramyocardial, moderately diseased but graftible  RCA:  Moderately diseased throughout the proximal and mid vessel.   Grafts:  1. LIMA to the LAD: 1.75 mm. It was sewn end to side using 8-0 prolene continuous suture. 2. SVG to Ramus:  1.6 mm. It was sewn end to side using 7-0 prolene continuous suture. 3. SVG to OM:  2.0  mm. It was sewn end to side using 7-0 prolene continuous suture. 4. SVG to RCA :  2.0 mm. It was sewn end to side using 7-0 prolene continuous suture.  The proximal vein graft anastomoses were performed to the mid-ascending aorta using continuous 6-0 prolene suture. Graft markers were placed  around the proximal anastomoses.   Completion:  The patient was rewarmed to 37 degrees Centigrade. The clamp was removed from the LIMA pedicle and there was rapid warming of the septum and return of sinus rhythm. The crossclamp was removed with a time of 99 minutes. There was spontaneous return of sinus rhythm. The distal and proximal anastomoses were checked for hemostasis. The position of the grafts was satisfactory. Two temporary epicardial pacing wires were placed on the right atrium and two on the right ventricle. The patient was weaned from CPB without difficulty on dopamine 3 mcg/kg/min. CPB time was 127 minutes. Cardiac output was 6 LPM. TEE showed normal LV function with no wall motion abnormalities. Heparin was fully reversed with protamine and the aortic and venous cannulas removed. Hemostasis was achieved. Mediastinal and left pleural drainage tubes were placed. The sternum was closed with double #6 stainless steel wires. The fascia was closed with continuous # 1 vicryl suture. The subcutaneous tissue was closed with 2-0 vicryl continuous suture. The skin was closed with 3-0 vicryl subcuticular suture. All sponge, needle, and instrument counts were reported correct at the end of the case. Dry sterile dressings were placed over the incisions and around the chest tubes which were connected to pleurevac suction. The patient was then transported to the surgical intensive care unit in critical but stable condition.

## 2013-10-10 NOTE — CV Procedure (Signed)
CARDIAC CATHETERIZATION REPORT  NAME:  COBIN CADAVID   MRN: [id number] DOB:  [birth year]   ADMIT DATE: 10/09/2013 Procedure Date: 10/10/2013  INTERVENTIONAL CARDIOLOGIST: Marykay Lex, M.D., MS PRIMARY CARE PROVIDER: Marga Melnick, MD PRIMARY CARDIOLOGIST: Marykay Lex, MD, MS  PATIENT:  Alfred Thompson is a 67 y.o. male no prior cardiac history or known cardiac risk factors beyond obesity who came to the emergency room today after being awakened by 6-10/10 substernal chest discomfort/pressure that would not dissipate.  Upon evaluation immersion was found to be in atrial fibrillation with RVR.  His heart rate responded very well to an IV bolus of diltiazem, however he became hypotensive.  The chest discomfort on away when the heart rate dropped below 120 beats per minute.  His blood pressure then stabilized and he has been chest pain-free since.  Further discussion with the patient revealed that he had a similar episode that spontaneously subsided after roughly 2-3 hours just about a week or so ago.  Additionally, he does endorse a similar type of chest discomfort with briskly climbing stairs or walking up a hill, that would be consistent with Class II Angina. Despite his "LOW RISK " calculation based on TIMI risk score, I believe his chest discomfort with A. fib RVR is Unstable Angina.  After prolonged discussion with the patient and his wife, we agreed with the course of action to proceed with invasive evaluation for definitive diagnosis.  PRE-OPERATIVE DIAGNOSIS:    Unstable Angina  New diagnosis of A. fib, presenting with RVR and anginal pain, consistent with unstable angina  Class II Angina at baseline by history  PROCEDURES PERFORMED:    LEFT HEART CATHETERIZATION WITH CORONARY ANGIOGRAPHY  PROCEDURE:Consent:  Risks of procedure as well as the alternatives and risks of each were explained to the (patient/caregiver).  Consent for procedure obtained. Consent for signed by MD and  patient with RN witness -- placed on chart.   PROCEDURE: The patient was brought to the 2nd Floor [facility] in the fasting state and prepped and draped in the usual sterile fashion for Right groin or radial access. A modified Allen's test with plethysmography was performed, revealing excellent Ulnar artery collateral flow.  Sterile technique was used including antiseptics, cap, gloves, gown, hand hygiene, mask and sheet.  Skin prep: Chlorhexidine.  Time Out: Verified patient identification, verified procedure, site/side was marked, verified correct patient position, special equipment/implants available, medications/allergies/relevent history reviewed, required imaging and test results available.  Performed  Access: Right Radial Artery; 5 Fr Sheath -- Seldinger technique (Angiocath Micropuncture Kit)  IA Radial Cocktail, IV Heparin Diagnostic:  5 French TIG 4.0, JR 4, and EZ Rad Right catheters were advanced and exchanged over a long exchange safety J-wire  Left Coronary Artery Angiography: TIG 4.0  Right Coronary Artery Angiography: Initial images with TIG 4.0, due to the proximal lesion and deep seating of the catheter, nonselective images were performed with the EZ Rad Right after the JR 4 was unsuccessful in engaging the RCA  LV Hemodynamics (LV Gram): TIG 4.0, hand injection LV gram  TR Band:  1649 Hours, 15 mL air  MEDICATIONS:  Anesthesia:  Local Lidocaine 2 ml  Sedation:  2 mg IV Versed, 50 mcg IV fentanyl ;   Omnipaque Contrast: 100 ml  Anticoagulation:  IV Heparin 5000 Units  Radial Cocktail: 5 mg Verapamil, 400 mcg NTG, 2 ml 2% Lidocaine in 10 ml NS 250 mL Normal Saline  Hemodynamics:  Central Aortic /  Mean Pressures: 97/70 mmHg; 80 to mmHg  Left Ventricular Pressures / EDP: 95/10 mmHg; 13 mmHg  Left Ventriculography:  EF: Probably 55 %  Wall Motion: No obvious abnormalities noted  Coronary Anatomy:  Left Main:  large-caliber vessel  that tapers to a 80-90% calcified, eccentric stenosis before it trifurcates into the LAD, Ramus Intermedius, and Circumflex LAD: Large-caliber vessel that gives rise to a relatively proximal first diagonal branch and septal perforator.  The proximal and early mid portion of this vessel has several old focal lesions ranging from 50-70%.  The most significant stenosis is just after the first diagonal branch its roughly 70-80%.  There is a more distal 40% lesion in the mid vessel before it tapers down around the apex.  D1: Small to moderate caliber, yet important branch vessel that has a tubular 90% stenosis before bifurcates.  Left Circumflex: Large caliber, nondominant vessel that essentially courses as a lateral OM with a very small AV groove branch.  The OM that essentially it trifurcates distally to perfuse the inferolateral wall.  Ramus intermedius: Small to moderate caliber bifurcating vessel with proximal 80% stenosis in the larger more lateral branch.  A smaller caliber more medial branch is relatively diminutive and diffusely diseased   RCA: Large-caliber vessel with a proximal, focal "napkin ring "shelflike lesion that is probably 80-90%.  The remainder the vessel is relatively free of disease.  It courses down to bifurcate distally into a small caliber RPDA and the  Right Posterior AV Groove Branch (RPAV)  RPDA: Relatively small caliber, diminutive vessel.  Not a great target.  RPL Sysytem:The RPAV is a moderate caliber vessel that gives off several posterolateral branches.  This would be a reasonable target, however the the RCA lesion can also be a great target for hybrid PCI if the distal target and not felt to be good bypass targets.  PATIENT DISPOSITION:    The patient was transferred to the PACU holding area in a hemodynamicaly stable, chest pain free condition.  The patient tolerated the procedure well, and there were no complications.  EBL:   < 10 ml  The patient was stable  before, during, and after the procedure.  POST-OPERATIVE DIAGNOSIS:    Severe distal LEFT MAIN and proximal RCA disease as well as high-grade LAD-diagonal disease  Preserved LVEF and LVEDP  Currently rate-controlled atrial fibrillation  Currently angina free, and hemodynamically stable  PLAN OF CARE:  Transferred to CCU for close cardiac monitoring.  CVTS has been consulted, and Dr. Rexanne Mano has graciously rushed to the Cath Lab to see the patient and review the films.  He has met with the patient's wife already.  Provided he remains stable overnight, the plan will be to proceed with urgent multivessel CABG first thing in the morning.  If there is any recurrence of symptoms, he is prepared to proceed emergently.  The only reason for postponing tonight, is that he is scheduled for 2 back to back emergent procedures this evening already.  His rapid attention has greatly appreciated.  Standard post radial cath care, will restart heparin 4-6 hours post TR band removal  I will initiate IV amiodarone loading, giving the first bolus gradually over one hour to avoid hypotension.  DC diltiazem.  As he will likely need long-term and a coagulation postoperatively, would not consider dual antiplatelet therapy postoperatively despite ACS presentation.  He will need beta blocker and statin therapy perioperatively.  Dr. Laneta Simmers and I have spoken with the patient's wife to discuss  the plan of action.  I personally spoke with the patient as well, but due to sedation and not sure if he was able to fully comprehend.  Marykay Lex, M.D., M.S. [facility] [address] [place], [place]  [address]  [phone]  10/10/2013 1:04 AM

## 2013-10-10 NOTE — Procedures (Signed)
Extubation Procedure Note  Patient Details:   Name: SALAAM BATTERSHELL DOB: [birth year] MRN: [id number]   Airway Documentation:     Evaluation  O2 sats: stable throughout Complications: No apparent complications Patient did tolerate procedure well. Bilateral Breath Sounds: Clear   Yes NIF-22 FVC Able to verbalize post extubation Placed to 50% VM, sp02 93.  Newt Lukes 10/10/2013, 5:54 PM

## 2013-10-10 NOTE — OR Nursing (Signed)
SICU first call @ 1229

## 2013-10-10 NOTE — Progress Notes (Signed)
*  PRELIMINARY RESULTS* Echocardiogram 2D Echocardiogram has been performed.  Alfred Thompson 10/10/2013, 8:50 AM

## 2013-10-11 ENCOUNTER — Inpatient Hospital Stay (HOSPITAL_COMMUNITY): Payer: Medicare Other

## 2013-10-11 DIAGNOSIS — I209 Angina pectoris, unspecified: Secondary | ICD-10-CM

## 2013-10-11 DIAGNOSIS — I2510 Atherosclerotic heart disease of native coronary artery without angina pectoris: Secondary | ICD-10-CM | POA: Diagnosis present

## 2013-10-11 LAB — BASIC METABOLIC PANEL
BUN: 12 mg/dL (ref 6–23)
Calcium: 7.6 mg/dL — ABNORMAL LOW (ref 8.4–10.5)
GFR calc non Af Amer: 85 mL/min — ABNORMAL LOW (ref >90)
Glucose, Bld: 124 mg/dL — ABNORMAL HIGH (ref 70–99)
Potassium: 4.1 mEq/L (ref 3.5–5.1)
Sodium: 140 mEq/L (ref 135–145)

## 2013-10-11 LAB — GLUCOSE, CAPILLARY
Glucose-Capillary: 93 mg/dL (ref 70–99)
Glucose-Capillary: 98 mg/dL (ref 70–99)
Glucose-Capillary: 98 mg/dL (ref 70–99)
Glucose-Capillary: 92 mg/dL (ref 70–99)
Glucose-Capillary: 90 mg/dL (ref 70–99)

## 2013-10-11 LAB — POCT I-STAT, CHEM 8
BUN: 14 mg/dL (ref 6–23)
Calcium, Ion: 1.07 mmol/L — ABNORMAL LOW (ref 1.13–1.30)
Chloride: 107 mEq/L (ref 96–112)
Creatinine, Ser: 1.2 mg/dL (ref 0.50–1.35)
Glucose, Bld: 92 mg/dL (ref 70–99)
HCT: 32 % — ABNORMAL LOW (ref 39.0–52.0)
TCO2: 24 mmol/L (ref 0–100)

## 2013-10-11 LAB — CREATININE, SERUM
Creatinine, Ser: 0.99 mg/dL (ref 0.50–1.35)
GFR calc non Af Amer: 84 mL/min — ABNORMAL LOW (ref >90)

## 2013-10-11 LAB — CBC
HCT: 37.9 % — ABNORMAL LOW (ref 39.0–52.0)
HCT: 34.4 % — ABNORMAL LOW (ref 39.0–52.0)
Hemoglobin: 13 g/dL (ref 13.0–17.0)
Hemoglobin: 11.9 g/dL — ABNORMAL LOW (ref 13.0–17.0)
MCH: 31.8 pg (ref 26.0–34.0)
MCHC: 34.3 g/dL (ref 30.0–36.0)
MCHC: 34.6 g/dL (ref 30.0–36.0)
MCV: 93.5 fL (ref 78.0–100.0)
RDW: 13.8 % (ref 11.5–15.5)

## 2013-10-11 MED ORDER — INSULIN DETEMIR 100 UNIT/ML ~~LOC~~ SOLN
15.0000 [IU] | Freq: Once | SUBCUTANEOUS | Status: AC
  Administered 2013-10-11: 15 [IU] via SUBCUTANEOUS
  Filled 2013-10-11: qty 0.15

## 2013-10-11 MED ORDER — POTASSIUM CHLORIDE 10 MEQ/50ML IV SOLN
10.0000 meq | INTRAVENOUS | Status: AC
  Administered 2013-10-11 (×3): 10 meq via INTRAVENOUS
  Filled 2013-10-11 (×3): qty 50

## 2013-10-11 MED ORDER — INSULIN ASPART 100 UNIT/ML ~~LOC~~ SOLN: 0.0000 [IU] | SUBCUTANEOUS | Status: DC

## 2013-10-11 MED ORDER — AMIODARONE HCL 200 MG PO TABS
400.0000 mg | ORAL_TABLET | Freq: Every day | ORAL | Status: DC
  Administered 2013-10-11: 400 mg via ORAL
  Filled 2013-10-11 (×2): qty 2

## 2013-10-11 MED ORDER — FUROSEMIDE 10 MG/ML IJ SOLN
40.0000 mg | Freq: Once | INTRAMUSCULAR | Status: AC
  Administered 2013-10-11: 40 mg via INTRAVENOUS
  Filled 2013-10-11: qty 4

## 2013-10-11 MED ORDER — INSULIN DETEMIR 100 UNIT/ML ~~LOC~~ SOLN
15.0000 [IU] | Freq: Every day | SUBCUTANEOUS | Status: DC
  Filled 2013-10-11: qty 0.15

## 2013-10-11 MED ORDER — ENOXAPARIN SODIUM 40 MG/0.4ML ~~LOC~~ SOLN
40.0000 mg | Freq: Every day | SUBCUTANEOUS | Status: DC
  Administered 2013-10-11: 40 mg via SUBCUTANEOUS
  Filled 2013-10-11 (×3): qty 0.4

## 2013-10-11 NOTE — Progress Notes (Signed)
1 Day Post-Op Procedure(s) (LRB): CORONARY ARTERY BYPASS GRAFTING (CABG) (N/A) INTRAOPERATIVE TRANSESOPHAGEAL ECHOCARDIOGRAM (N/A) Subjective: No complaints  Objective: Vital signs in last 24 hours: Temp:  [97.9 F (36.6 C)-100.2 F (37.9 C)] 97.9 F (36.6 C) (10/19 0745) Pulse Rate:  [66-81] 79 (10/19 0745) Cardiac Rhythm:  [-] Atrial paced (10/19 0740) Resp:  [10-24] 15 (10/19 0745) BP: (84-124)/(43-78) 101/65 mmHg (10/19 0700) SpO2:  [92 %-100 %] 96 % (10/19 0745) Arterial Line BP: (97-137)/(48-74) 107/48 mmHg (10/19 0745) FiO2 (%):  [40 %-70 %] 50 % (10/19 0600) Weight:  [99 kg (218 lb 4.1 oz)-107 kg (235 lb 14.3 oz)] 107 kg (235 lb 14.3 oz) (10/19 0500)  Hemodynamic parameters for last 24 hours: PAP: (25-45)/(11-33) 30/13 mmHg CO:  [4.3 L/min-6.1 L/min] 6 L/min CI:  [2.1 L/min/m2-2.9 L/min/m2] 2.9 L/min/m2  Intake/Output from previous day: 10/18 0701 - 10/19 0700 In: 5283.3 [P.O.:240; I.V.:2942.3; Blood:521; NG/GT:30; IV Piggyback:1550] Out: 4330 [Urine:2880; Blood:950; Chest Tube:500] Intake/Output this shift:    General appearance: alert and cooperative Neurologic: intact Heart: regular rate and rhythm, S1, S2 normal, no murmur, click, rub or gallop Lungs: clear to auscultation bilaterally Extremities: edema mild Wound: dressings dry  Lab Results:  Recent Labs  10/10/13 1945 10/11/13 0400  WBC 17.0* 14.8*  HGB 13.9 13.0  HCT 39.3 37.9*  PLT 125* 121*   BMET:  Recent Labs  10/09/13 1900  10/10/13 1353 10/10/13 1945 10/11/13 0400  NA 141  < > 143  --  140  K 4.5  < > 3.6  --  4.1  CL 106  --   --   --  106  CO2 27  --   --   --  24  GLUCOSE 100*  < > 98  --  124*  BUN 11  --   --   --  12  CREATININE 1.08  --   --  1.02 0.96  CALCIUM 8.9  --   --   --  7.6*  < > = values in this interval not displayed.  PT/INR:  Recent Labs  10/10/13 1400  LABPROT 17.9*  INR 1.52*   ABG    Component Value Date/Time   PHART 7.362 10/10/2013 1851   HCO3  22.3 10/10/2013 1851   TCO2 23 10/10/2013 1851   ACIDBASEDEF 3.0* 10/10/2013 1851   O2SAT 89.0 10/10/2013 1851   CBG (last 3)   Recent Labs  10/10/13 1759 10/10/13 1851 10/10/13 1951  GLUCAP 149* 147* 142*   CLINICAL DATA: Postop cardiac surgery  EXAM:  PORTABLE CHEST - 1 VIEW  COMPARISON: Prior chest x-ray 10/10/2013  FINDINGS:  The patient has been extubated and the nasogastric tube removed.  Right IJ vascular sheath conveys a Swan-Ganz catheter into the  heart. The tip of the Swan overlies the right main pulmonary artery.  Subxiphoid approach mediastinal, pericardial and left chest drains.  No pneumothorax. Persistent left layering pleural effusion with  associated basilar atelectasis. Improving pulmonary edema.  Inspiratory volumes remain low.  IMPRESSION:  1. Interval extubation and removal of nasogastric tubes.  2. Resolving pulmonary vascular congestion.  3. Persistent left pleural effusion and associated basilar  atelectasis.  4. Remaining support apparatus in stable and satisfactory position  as above.  Electronically Signed  By: Malachy Moan M.D.  On: 10/11/2013 07:53   ECG:  NSR, nonspecific changes Assessment/Plan: S/P Procedure(s) (LRB): CORONARY ARTERY BYPASS GRAFTING (CABG) (N/A) INTRAOPERATIVE TRANSESOPHAGEAL ECHOCARDIOGRAM (N/A) Mobilize Diuresis Diabetes control d/c tubes/lines See progression orders  LOS: 2 days    BARTLE,BRYAN K 10/11/2013

## 2013-10-11 NOTE — Progress Notes (Signed)
Subjective:  Awake and alert  Objective:  Vital Signs in the last 24 hours: Temp:  [97.9 F (36.6 C)-100.2 F (37.9 C)] 97.9 F (36.6 C) (10/19 0745) Pulse Rate:  [66-81] 79 (10/19 0745) Resp:  [10-24] 15 (10/19 0745) BP: (84-124)/(43-78) 101/65 mmHg (10/19 0700) SpO2:  [92 %-100 %] 96 % (10/19 0745) Arterial Line BP: (97-137)/(48-74) 107/48 mmHg (10/19 0745) FiO2 (%):  [40 %-70 %] 50 % (10/19 0600) Weight:  [218 lb 4.1 oz (99 kg)-235 lb 14.3 oz (107 kg)] 235 lb 14.3 oz (107 kg) (10/19 0500)  Intake/Output from previous day:  Intake/Output Summary (Last 24 hours) at 10/11/13 0916 Last data filed at 10/11/13 0700  Gross per 24 hour  Intake 5283.3 ml  Output   4330 ml  Net  953.3 ml    Physical Exam: General appearance: alert, cooperative and no distress Lungs: decreased breath sounds Heart: regular rate and rhythm   Rate: 80  Rhythm: normal sinus rhythm  Lab Results:  Recent Labs  10/10/13 1945 10/11/13 0400  WBC 17.0* 14.8*  HGB 13.9 13.0  PLT 125* 121*    Recent Labs  10/09/13 1900  10/10/13 1353 10/10/13 1945 10/11/13 0400  NA 141  < > 143  --  140  K 4.5  < > 3.6  --  4.1  CL 106  --   --   --  106  CO2 27  --   --   --  24  GLUCOSE 100*  < > 98  --  124*  BUN 11  --   --   --  12  CREATININE 1.08  --   --  1.02 0.96  < > = values in this interval not displayed.  Recent Labs  10/09/13 1900 10/10/13 0046  TROPONINI <0.30 0.45*    Recent Labs  10/10/13 1400  INR 1.52*    Imaging: Imaging results have been reviewed  Cardiac Studies:  Assessment/Plan:   Principal Problem:   Unstable angina Active Problems:   CAD - severe at cath - CABG X 4 10/10/13   Atrial fibrillation with RVR on adm 10/09/13 - NSR post op on Amio   Obesity (BMI 30.0-34.9)  PLAN: Transition to po Amiodarone ordered. ? If he will need anticoagulation at discharge.   Corine Shelter PA-C Beeper [phone] 10/11/2013, 9:16 AM  He looks great POD#1 - ready to walk.    No further Afib -- on PO Amio now.  Will need AC on d/c; would consider a NOAC a preferred choice (Xarelto vs. Eliquis).  Will add statin.   Diuresis per CVTS.    I Appreciate Dr. Sharee Pimple rapid responsiveness & diligence with this patient.  Marykay Lex, MD

## 2013-10-12 ENCOUNTER — Inpatient Hospital Stay (HOSPITAL_COMMUNITY): Payer: Medicare Other

## 2013-10-12 LAB — CBC
HCT: 34.9 % — ABNORMAL LOW (ref 39.0–52.0)
Hemoglobin: 12 g/dL — ABNORMAL LOW (ref 13.0–17.0)
MCH: 32.3 pg (ref 26.0–34.0)
MCHC: 34.4 g/dL (ref 30.0–36.0)
MCV: 94.1 fL (ref 78.0–100.0)
Platelets: 91 10*3/uL — ABNORMAL LOW (ref 150–400)
RBC: 3.71 MIL/uL — ABNORMAL LOW (ref 4.22–5.81)
WBC: 13 10*3/uL — ABNORMAL HIGH (ref 4.0–10.5)

## 2013-10-12 LAB — GLUCOSE, CAPILLARY
Glucose-Capillary: 106 mg/dL — ABNORMAL HIGH (ref 70–99)
Glucose-Capillary: 106 mg/dL — ABNORMAL HIGH (ref 70–99)
Glucose-Capillary: 108 mg/dL — ABNORMAL HIGH (ref 70–99)
Glucose-Capillary: 121 mg/dL — ABNORMAL HIGH (ref 70–99)
Glucose-Capillary: 122 mg/dL — ABNORMAL HIGH (ref 70–99)
Glucose-Capillary: 123 mg/dL — ABNORMAL HIGH (ref 70–99)
Glucose-Capillary: 132 mg/dL — ABNORMAL HIGH (ref 70–99)
Glucose-Capillary: 170 mg/dL — ABNORMAL HIGH (ref 70–99)
Glucose-Capillary: 80 mg/dL (ref 70–99)
Glucose-Capillary: 94 mg/dL (ref 70–99)
Glucose-Capillary: 95 mg/dL (ref 70–99)
Glucose-Capillary: 97 mg/dL (ref 70–99)

## 2013-10-12 LAB — BASIC METABOLIC PANEL
BUN: 17 mg/dL (ref 6–23)
CO2: 27 mEq/L (ref 19–32)
Chloride: 102 mEq/L (ref 96–112)
Sodium: 136 mEq/L (ref 135–145)

## 2013-10-12 MED ORDER — AMIODARONE HCL 200 MG PO TABS
400.0000 mg | ORAL_TABLET | Freq: Once | ORAL | Status: AC
  Administered 2013-10-12: 400 mg via ORAL
  Filled 2013-10-12: qty 2

## 2013-10-12 MED ORDER — ATORVASTATIN CALCIUM 40 MG PO TABS
40.0000 mg | ORAL_TABLET | Freq: Every day | ORAL | Status: DC
  Administered 2013-10-12 – 2013-10-13 (×2): 40 mg via ORAL
  Filled 2013-10-12 (×3): qty 1

## 2013-10-12 MED ORDER — DOCUSATE SODIUM 100 MG PO CAPS
200.0000 mg | ORAL_CAPSULE | Freq: Every day | ORAL | Status: DC
  Administered 2013-10-12 – 2013-10-13 (×2): 200 mg via ORAL
  Filled 2013-10-12 (×3): qty 2

## 2013-10-12 MED ORDER — METOPROLOL TARTRATE 12.5 MG HALF TABLET
12.5000 mg | ORAL_TABLET | Freq: Two times a day (BID) | ORAL | Status: DC
  Administered 2013-10-12 (×2): 12.5 mg via ORAL
  Filled 2013-10-12 (×4): qty 1

## 2013-10-12 MED ORDER — TRAMADOL HCL 50 MG PO TABS
50.0000 mg | ORAL_TABLET | ORAL | Status: DC | PRN
  Administered 2013-10-12: 100 mg via ORAL
  Filled 2013-10-12: qty 2

## 2013-10-12 MED ORDER — SODIUM CHLORIDE 0.9 % IV SOLN: 250.0000 mL | INTRAVENOUS | Status: DC | PRN

## 2013-10-12 MED ORDER — FAMOTIDINE 20 MG PO TABS
20.0000 mg | ORAL_TABLET | Freq: Two times a day (BID) | ORAL | Status: DC
  Administered 2013-10-12 – 2013-10-13 (×4): 20 mg via ORAL
  Filled 2013-10-12 (×6): qty 1

## 2013-10-12 MED ORDER — ONDANSETRON HCL 4 MG/2ML IJ SOLN
4.0000 mg | Freq: Four times a day (QID) | INTRAMUSCULAR | Status: DC | PRN
  Administered 2013-10-12 (×2): 4 mg via INTRAVENOUS
  Filled 2013-10-12 (×2): qty 2

## 2013-10-12 MED ORDER — ASPIRIN EC 325 MG PO TBEC
325.0000 mg | DELAYED_RELEASE_TABLET | Freq: Every day | ORAL | Status: DC
Start: 2013-10-12 — End: 2013-10-14
  Administered 2013-10-12 – 2013-10-13 (×2): 325 mg via ORAL
  Filled 2013-10-12 (×3): qty 1

## 2013-10-12 MED ORDER — ONDANSETRON HCL 4 MG PO TABS: 4.0000 mg | ORAL_TABLET | Freq: Four times a day (QID) | ORAL | Status: DC | PRN

## 2013-10-12 MED ORDER — SODIUM CHLORIDE 0.9 % IJ SOLN
3.0000 mL | INTRAMUSCULAR | Status: DC | PRN
  Administered 2013-10-18: 3 mL via INTRAVENOUS

## 2013-10-12 MED ORDER — BISACODYL 10 MG RE SUPP: 10.0000 mg | Freq: Every day | RECTAL | Status: DC | PRN

## 2013-10-12 MED ORDER — ACETAMINOPHEN 325 MG PO TABS
650.0000 mg | ORAL_TABLET | Freq: Four times a day (QID) | ORAL | Status: DC | PRN
  Administered 2013-10-14 – 2013-10-17 (×2): 650 mg via ORAL
  Filled 2013-10-12 (×2): qty 2

## 2013-10-12 MED ORDER — FUROSEMIDE 40 MG PO TABS
40.0000 mg | ORAL_TABLET | Freq: Every day | ORAL | Status: DC
  Administered 2013-10-12: 40 mg via ORAL
  Filled 2013-10-12 (×2): qty 1

## 2013-10-12 MED ORDER — OXYCODONE HCL 5 MG PO TABS
5.0000 mg | ORAL_TABLET | ORAL | Status: DC | PRN
  Administered 2013-10-13 (×2): 10 mg via ORAL
  Filled 2013-10-12 (×2): qty 2

## 2013-10-12 MED ORDER — SODIUM CHLORIDE 0.9 % IJ SOLN
3.0000 mL | Freq: Two times a day (BID) | INTRAMUSCULAR | Status: DC
  Administered 2013-10-12: 3 mL via INTRAVENOUS
  Administered 2013-10-12: 11:00:00 via INTRAVENOUS
  Administered 2013-10-13 – 2013-10-20 (×14): 3 mL via INTRAVENOUS

## 2013-10-12 MED ORDER — MOVING RIGHT ALONG BOOK
Freq: Once | Status: AC
  Administered 2013-10-12: 09:00:00
  Filled 2013-10-12: qty 1

## 2013-10-12 MED ORDER — BISACODYL 5 MG PO TBEC
10.0000 mg | DELAYED_RELEASE_TABLET | Freq: Every day | ORAL | Status: DC | PRN
  Administered 2013-10-12: 10 mg via ORAL
  Filled 2013-10-12: qty 2

## 2013-10-12 MED ORDER — POTASSIUM CHLORIDE CRYS ER 20 MEQ PO TBCR
20.0000 meq | EXTENDED_RELEASE_TABLET | Freq: Two times a day (BID) | ORAL | Status: DC
  Administered 2013-10-12 (×2): 20 meq via ORAL
  Filled 2013-10-12 (×4): qty 1

## 2013-10-12 MED ORDER — AMIODARONE HCL 200 MG PO TABS
200.0000 mg | ORAL_TABLET | Freq: Every day | ORAL | Status: DC
  Administered 2013-10-12: 200 mg via ORAL
  Filled 2013-10-12: qty 1

## 2013-10-12 MED ORDER — INSULIN ASPART 100 UNIT/ML ~~LOC~~ SOLN
0.0000 [IU] | Freq: Three times a day (TID) | SUBCUTANEOUS | Status: DC
  Administered 2013-10-15 (×2): 2 [IU] via SUBCUTANEOUS

## 2013-10-12 MED ORDER — AMIODARONE HCL 200 MG PO TABS
400.0000 mg | ORAL_TABLET | Freq: Every day | ORAL | Status: DC
  Filled 2013-10-12: qty 2

## 2013-10-12 MED FILL — Sodium Bicarbonate IV Soln 8.4%: INTRAVENOUS | Qty: 50 | Status: AC

## 2013-10-12 MED FILL — Sodium Chloride Irrigation Soln 0.9%: Qty: 1000 | Status: AC

## 2013-10-12 MED FILL — Lidocaine HCl IV Inj 20 MG/ML: INTRAVENOUS | Qty: 5 | Status: AC

## 2013-10-12 MED FILL — Heparin Sodium (Porcine) Inj 1000 Unit/ML: INTRAMUSCULAR | Qty: 20 | Status: AC

## 2013-10-12 MED FILL — Electrolyte-R (PH 7.4) Solution: INTRAVENOUS | Qty: 4000 | Status: AC

## 2013-10-12 MED FILL — Mannitol IV Soln 20%: INTRAVENOUS | Qty: 500 | Status: AC

## 2013-10-12 NOTE — Clinical Documentation Improvement (Signed)
THIS DOCUMENT IS NOT A PERMANENT PART OF THE MEDICAL RECORD  Please update your documentation with the medical record to reflect your response to this query. If you need help knowing how to do this please call [phone].  10/12/13   Dear Dr. Laneta Simmers / Associates,  In a better effort to capture your patient's severity of illness, reflect appropriate length of stay and utilization of resources, a review of the patient medical record has revealed the following indicators.    Based on your clinical judgment, please clarify and document in a progress note and/or discharge summary the clinical condition associated with the following supporting information:  In responding to this query please exercise your independent judgment.  The fact that a query is asked, does not imply that any particular answer is desired or expected.   Possible Clinical Conditions?  "         Pleural Effusion  "         Atelectasis  "         Other Condition  "         Cannot Clinically Determine      Diagnostics: CXR:  10/18:   Streaky atelectasis and a small left effusion. 10/19:   Persistent left pleural effusion and associated basilar atelectasis.   You may use possible, probable, or suspect with inpatient documentation. possible, probable, suspected diagnoses MUST be documented at the time of discharge  Reviewed:  no additional documentation provided  Thank You,  Marciano Sequin, Clinical Documentation Specialist: [phone] Health Information Management [place]

## 2013-10-12 NOTE — Care Management Note (Signed)
    Page 1 of 2   10/20/2013     3:51:55 PM   CARE MANAGEMENT NOTE 10/20/2013  Patient:  Alfred Thompson, Alfred Thompson   Account Number:  [id number]  Date Initiated:  10/12/2013  Documentation initiated by:  Breyon Sigg  Subjective/Objective Assessment:   PT S/P CABG X 4 ON 10/09/13.  PTA, PT INDEPENDENT, LIVES WITH SPOUSE.     Action/Plan:   WILL FOLLOW FOR DC NEEDS AS PT PROGRESSES.  WIFE TO PROVIDE CARE AT DC.   Anticipated DC Date:  10/14/2013   Anticipated DC Plan:  HOME/SELF CARE      DC Planning Services  CM consult      Choice offered to / List presented to:     DME arranged  Levan Hurst      DME agency  [facility]        Status of service:  Completed, signed off Medicare Important Message given?   (If response is "NO", the following Medicare IM given date fields will be blank) Date Medicare IM given:   Date Additional Medicare IM given:    Discharge Disposition:  HOME/SELF CARE  Per UR Regulation:  Reviewed for med. necessity/level of care/duration of stay  If discussed at Long Length of Stay Meetings, dates discussed:   10/13/2013  10/15/2013  10/20/2013    Comments:  10/20/13 Moncerrat Burnstein,RN,BSN [phone] PT FOR DC HOME TODAY WITH SPOUSE.  RW DELIVERED TO ROOM PRIOR TO DC.  10/13/13 Sheneika Walstad,RN,BSN [phone] PT WITH POST OP AFIB; FRUSTRATED ABOUT PROGRESS.  REQUESTS RW FOR HOME.  REFERRAL TO [facility] FOR DME NEEDS.  WILL FOLLOW PROGRESS.

## 2013-10-12 NOTE — Plan of Care (Signed)
Problem: Phase III Progression Outcomes Goal: Time patient transferred to PCTU/Telemetry POD Outcome: Completed/Met Date Met:  10/12/13 1400-patient ambulated 152feet, then wheeled out to 2W, room 18. VS stable, HR in 70s, Sinus with PACs. No personal belongings at bedside of 2S that could be taken up to his new room. Patient's spouse aware of the transfer and room number. Report given to receiving RN, Charlett Lango

## 2013-10-12 NOTE — Progress Notes (Signed)
2 Days Post-Op Procedure(s) (LRB): CORONARY ARTERY BYPASS GRAFTING (CABG) (N/A) INTRAOPERATIVE TRANSESOPHAGEAL ECHOCARDIOGRAM (N/A) Subjective:  Up in the chair for 3 hrs this am and now tired  Objective: Vital signs in last 24 hours: Temp:  [97.7 F (36.5 C)-98.2 F (36.8 C)] 97.7 F (36.5 C) (10/20 0400) Pulse Rate:  [36-81] 81 (10/20 0700) Cardiac Rhythm:  [-] Ventricular paced (10/20 0700) Resp:  [13-23] 22 (10/20 0800) BP: (93-125)/(37-72) 118/61 mmHg (10/20 0700) SpO2:  [93 %-97 %] 95 % (10/20 0800) Arterial Line BP: (91)/(49) 91/49 mmHg (10/19 0900) Weight:  [104.7 kg (230 lb 13.2 oz)] 104.7 kg (230 lb 13.2 oz) (10/20 0600)  Hemodynamic parameters for last 24 hours: PAP: (28)/(15) 28/15 mmHg  Intake/Output from previous day: 10/19 0701 - 10/20 0700 In: 1255.7 [P.O.:540; I.V.:465.7; IV Piggyback:250] Out: 2250 [Urine:2150; Chest Tube:100] Intake/Output this shift:    General appearance: alert and cooperative Neurologic: intact Heart: regular rate and rhythm and looks having frequent PAC's Lungs: clear to auscultation bilaterally Extremities: edema mild Wound: incision ok  Lab Results:  Recent Labs  10/11/13 1700 10/11/13 1727 10/12/13 0352  WBC 12.3*  --  13.0*  HGB 11.9* 10.9* 12.0*  HCT 34.4* 32.0* 34.9*  PLT 91*  --  91*   BMET:  Recent Labs  10/11/13 0400  10/11/13 1727 10/12/13 0352  NA 140  --  138 136  K 4.1  --  3.7 4.0  CL 106  --  107 102  CO2 24  --   --  27  GLUCOSE 124*  --  92 99  BUN 12  --  14 17  CREATININE 0.96  < > 1.20 1.04  CALCIUM 7.6*  --   --  8.0*  < > = values in this interval not displayed.  PT/INR:  Recent Labs  10/10/13 1400  LABPROT 17.9*  INR 1.52*   ABG    Component Value Date/Time   PHART 7.362 10/10/2013 1851   HCO3 22.3 10/10/2013 1851   TCO2 24 10/11/2013 1727   ACIDBASEDEF 3.0* 10/10/2013 1851   O2SAT 89.0 10/10/2013 1851   CBG (last 3)   Recent Labs  10/11/13 2049 10/11/13 2347  10/12/13 0328  GLUCAP 97 95 106*    Assessment/Plan: S/P Procedure(s) (LRB): CORONARY ARTERY BYPASS GRAFTING (CABG) (N/A) INTRAOPERATIVE TRANSESOPHAGEAL ECHOCARDIOGRAM (N/A) Mobilize Diuresis Plan for transfer to step-down: see transfer orders   LOS: 3 days    Hester Forget K 10/12/2013

## 2013-10-12 NOTE — Progress Notes (Signed)
Subjective:  Awake and alert; just tired after sitting up in chair today. Ambulated a little yesterday , but got dizzy.  Objective:  Vital Signs in the last 24 hours: Temp:  [97.7 F (36.5 C)-98.2 F (36.8 C)] 98.1 F (36.7 C) (10/20 0837) Pulse Rate:  [36-81] 42 (10/20 0900) Resp:  [14-23] 15 (10/20 0900) BP: (95-125)/(37-72) 124/56 mmHg (10/20 0900) SpO2:  [93 %-97 %] 94 % (10/20 0900) Weight:  [230 lb 13.2 oz (104.7 kg)] 230 lb 13.2 oz (104.7 kg) (10/20 0600)  Intake/Output from previous day:  Intake/Output Summary (Last 24 hours) at 10/12/13 0910 Last data filed at 10/12/13 0900  Gross per 24 hour  Intake 1315.67 ml  Output   2240 ml  Net -924.33 ml    Physical Exam: General appearance: alert, cooperative and no distress Lungs: decreased breath sounds Heart: regular rate and rhythm with ectopy Abd: soft/NT/D - + BS Ext: mild edema   Rate: 80  Rhythm: normal sinus rhythm with PACs  Lab Results:  Recent Labs  10/11/13 1700 10/11/13 1727 10/12/13 0352  WBC 12.3*  --  13.0*  HGB 11.9* 10.9* 12.0*  PLT 91*  --  91*    Recent Labs  10/11/13 0400  10/11/13 1727 10/12/13 0352  NA 140  --  138 136  K 4.1  --  3.7 4.0  CL 106  --  107 102  CO2 24  --   --  27  GLUCOSE 124*  --  92 99  BUN 12  --  14 17  CREATININE 0.96  < > 1.20 1.04  < > = values in this interval not displayed.  Recent Labs  10/09/13 1900 10/10/13 0046  TROPONINI <0.30 0.45*    Recent Labs  10/10/13 1400  INR 1.52*    Imaging: Imaging results have been reviewed  Cardiac Studies:  Assessment/Plan:   Principal Problem:   Unstable angina Active Problems:   Obesity (BMI 30.0-34.9)   Atrial fibrillation with RVR on adm 10/09/13 - NSR post op on Amio   CAD - severe at cath - CABG X 4 10/10/13  PLAN: Transition to po Amiodarone ordered. ? If he will need anticoagulation at discharge.   Corine Shelter PA-C Beeper [phone] 10/12/2013, 9:10 AM  He looks great POD#2 - as  expected, more sore today than yesterday. No further Afib, SR with PACs-- on PO Amio now, but did have some bradycardia, will cut back to 200mg . - would not push BB dose yet.    Will need AC on d/c; would consider a NOAC a preferred choice (Xarelto vs. Eliquis).  Platelet count is a bit low, but stable -- will need to watch for ? HITT;  BP is stable - hold off on ACE-I/ARB for now.   Will add statin.   Diuresis per CVTS - did well yesterday with ~1 L out.    Marykay Lex, M.D., M.S. [facility] [address] [place], [place]  [address]  [phone] Pager # [phone] 10/12/2013 9:17 AM

## 2013-10-13 ENCOUNTER — Encounter (HOSPITAL_COMMUNITY): Payer: Self-pay | Admitting: Surgery

## 2013-10-13 DIAGNOSIS — Z951 Presence of aortocoronary bypass graft: Secondary | ICD-10-CM

## 2013-10-13 LAB — GLUCOSE, CAPILLARY
Glucose-Capillary: 100 mg/dL — ABNORMAL HIGH (ref 70–99)
Glucose-Capillary: 78 mg/dL (ref 70–99)
Glucose-Capillary: 96 mg/dL (ref 70–99)

## 2013-10-13 LAB — BASIC METABOLIC PANEL
CO2: 29 mEq/L (ref 19–32)
Calcium: 8 mg/dL — ABNORMAL LOW (ref 8.4–10.5)
Chloride: 103 mEq/L (ref 96–112)
GFR calc Af Amer: 86 mL/min — ABNORMAL LOW (ref >90)
Glucose, Bld: 104 mg/dL — ABNORMAL HIGH (ref 70–99)
Potassium: 4.5 mEq/L (ref 3.5–5.1)
Sodium: 139 mEq/L (ref 135–145)

## 2013-10-13 MED ORDER — AMIODARONE IV BOLUS ONLY 150 MG/100ML
150.0000 mg | Freq: Once | INTRAVENOUS | Status: AC
  Administered 2013-10-13: 150 mg via INTRAVENOUS
  Filled 2013-10-13: qty 100

## 2013-10-13 MED ORDER — AMIODARONE HCL 200 MG PO TABS
400.0000 mg | ORAL_TABLET | Freq: Two times a day (BID) | ORAL | Status: DC
  Administered 2013-10-13: 400 mg via ORAL
  Filled 2013-10-13 (×3): qty 2

## 2013-10-13 MED ORDER — POTASSIUM CHLORIDE CRYS ER 20 MEQ PO TBCR
20.0000 meq | EXTENDED_RELEASE_TABLET | Freq: Every day | ORAL | Status: DC
  Filled 2013-10-13: qty 1

## 2013-10-13 MED ORDER — AMIODARONE HCL 200 MG PO TABS
400.0000 mg | ORAL_TABLET | Freq: Every day | ORAL | Status: DC
  Administered 2013-10-13: 400 mg via ORAL
  Filled 2013-10-13: qty 2

## 2013-10-13 MED ORDER — FUROSEMIDE 10 MG/ML IJ SOLN
40.0000 mg | Freq: Once | INTRAMUSCULAR | Status: AC
  Administered 2013-10-13: 40 mg via INTRAVENOUS
  Filled 2013-10-13: qty 4

## 2013-10-13 MED ORDER — METOPROLOL TARTRATE 25 MG PO TABS
25.0000 mg | ORAL_TABLET | Freq: Two times a day (BID) | ORAL | Status: DC
  Administered 2013-10-13 (×2): 25 mg via ORAL
  Filled 2013-10-13 (×4): qty 1

## 2013-10-13 MED ORDER — FUROSEMIDE 40 MG PO TABS
40.0000 mg | ORAL_TABLET | Freq: Every day | ORAL | Status: DC
  Filled 2013-10-13: qty 1

## 2013-10-13 MED ORDER — AMIODARONE HCL 200 MG PO TABS: 400.0000 mg | ORAL_TABLET | Freq: Two times a day (BID) | ORAL | Status: DC

## 2013-10-13 MED FILL — Potassium Chloride Inj 2 mEq/ML: INTRAVENOUS | Qty: 40 | Status: AC

## 2013-10-13 MED FILL — Sodium Chloride IV Soln 0.9%: INTRAVENOUS | Qty: 1000 | Status: AC

## 2013-10-13 MED FILL — Magnesium Sulfate Inj 50%: INTRAMUSCULAR | Qty: 10 | Status: AC

## 2013-10-13 MED FILL — Dexmedetomidine HCl IV Soln 200 MCG/2ML: INTRAVENOUS | Qty: 2 | Status: AC

## 2013-10-13 MED FILL — Heparin Sodium (Porcine) Inj 1000 Unit/ML: INTRAMUSCULAR | Qty: 30 | Status: AC

## 2013-10-13 NOTE — Progress Notes (Signed)
Subjective: Feels tired and fatigue. He denies SOB.   Objective: Vital signs in last 24 hours: Temp:  [97.3 F (36.3 C)-99 F (37.2 C)] 98.3 F (36.8 C) (10/21 1610) Pulse Rate:  [66-137] 137 (10/21 9604) Resp:  [13-20] 18 (10/21 5409) BP: (93-122)/(61-82) 115/82 mmHg (10/21 0638) SpO2:  [94 %-98 %] 95 % (10/21 8119) Weight:  [229 lb 8 oz (104.1 kg)] 229 lb 8 oz (104.1 kg) (10/21 1478) Last BM Date: 10/13/13  Intake/Output from previous day: 10/20 0701 - 10/21 0700 In: 526.7 [P.O.:480; I.V.:46.7] Out: 660 [Urine:660] Intake/Output this shift:    Medications Current Facility-Administered Medications  Medication Dose Route Frequency Provider Last Rate Last Dose  . 0.9 %  sodium chloride infusion  250 mL Intravenous PRN Alleen Borne, MD      . acetaminophen (TYLENOL) tablet 650 mg  650 mg Oral Q6H PRN Alleen Borne, MD      . amiodarone (PACERONE) tablet 400 mg  400 mg Oral Daily Donielle M Zimmerman, PA-C      . aspirin EC tablet 325 mg  325 mg Oral Daily Alleen Borne, MD   325 mg at 10/12/13 1118  . atorvastatin (LIPITOR) tablet 40 mg  40 mg Oral q1800 Marykay Lex, MD   40 mg at 10/12/13 2041  . bisacodyl (DULCOLAX) EC tablet 10 mg  10 mg Oral Daily PRN Alleen Borne, MD   10 mg at 10/12/13 1115   Or  . bisacodyl (DULCOLAX) suppository 10 mg  10 mg Rectal Daily PRN Alleen Borne, MD      . docusate sodium (COLACE) capsule 200 mg  200 mg Oral Daily Alleen Borne, MD   200 mg at 10/12/13 1115  . famotidine (PEPCID) tablet 20 mg  20 mg Oral BID Alleen Borne, MD   20 mg at 10/12/13 2135  . [START ON 10/14/2013] furosemide (LASIX) tablet 40 mg  40 mg Oral Daily Donielle M Zimmerman, PA-C      . insulin aspart (novoLOG) injection 0-24 Units  0-24 Units Subcutaneous TID AC & HS Alleen Borne, MD      . metoprolol tartrate (LOPRESSOR) tablet 25 mg  25 mg Oral BID Donielle M Zimmerman, PA-C      . ondansetron [facility]) tablet 4 mg  4 mg Oral Q6H PRN Alleen Borne, MD        Or  . ondansetron (ZOFRAN) injection 4 mg  4 mg Intravenous Q6H PRN Alleen Borne, MD   4 mg at 10/12/13 1706  . oxyCODONE (Oxy IR/ROXICODONE) immediate release tablet 5-10 mg  5-10 mg Oral Q3H PRN Alleen Borne, MD   10 mg at 10/13/13 0253  . [START ON 10/14/2013] potassium chloride SA (K-DUR,KLOR-CON) CR tablet 20 mEq  20 mEq Oral Daily Donielle Margaretann Loveless, PA-C      . sodium chloride 0.9 % injection 3 mL  3 mL Intravenous Q12H Alleen Borne, MD   3 mL at 10/12/13 2137  . sodium chloride 0.9 % injection 3 mL  3 mL Intravenous PRN Alleen Borne, MD      . traMADol Janean Sark) tablet 50-100 mg  50-100 mg Oral Q4H PRN Alleen Borne, MD   100 mg at 10/12/13 1115    PE: General appearance: alert, cooperative, no distress and moderately obese Lungs: decrased breath sounds at the base. Faint bilateral expiratory wheezing. Heart: regular rhythm, fast rate Extremities: trace bilateral LEE Pulses: 2+ and  symmetric Skin: warm and dry Neurologic: Grossly normal  Lab Results:   Recent Labs  10/11/13 0400 10/11/13 1700 10/11/13 1727 10/12/13 0352  WBC 14.8* 12.3*  --  13.0*  HGB 13.0 11.9* 10.9* 12.0*  HCT 37.9* 34.4* 32.0* 34.9*  PLT 121* 91*  --  91*   BMET  Recent Labs  10/11/13 0400  10/11/13 1727 10/12/13 0352 10/13/13 0538  NA 140  --  138 136 139  K 4.1  --  3.7 4.0 4.5  CL 106  --  107 102 103  CO2 24  --   --  27 29  GLUCOSE 124*  --  92 99 104*  BUN 12  --  14 17 22   CREATININE 0.96  < > 1.20 1.04 1.03  CALCIUM 7.6*  --   --  8.0* 8.0*  < > = values in this interval not displayed. PT/INR  Recent Labs  10/10/13 1400  LABPROT 17.9*  INR 1.52*     Assessment/Plan  Principal Problem:   Unstable angina Active Problems:   Obesity (BMI 30.0-34.9)   Atrial fibrillation with RVR on adm 10/09/13 - NSR post op on Amio   CAD - severe at cath - CABG X 4 10/10/13  Plan: Day 3 s/p CABG. Sinus tach on telemetry. Continue PO Amiodarone. Lopressor was  increased this AM to 25 mg BID. Continue PO Lasix for diuresis. Continue on ASA, statin, BB, and ACE-I.    LOS: 4 days    Brittainy M. Sharol Harness, PA-C 10/13/2013 9:33 AM  Back in Afib RVR today => plan is reload IV Amiodarone & increase BB.; this could explain his fatigue.   Did have a small BM, but abdomen is still a bit distended with high pitch BS.  Monitor for ileus.  On ASA, Statin, BB & ACE --> will need long-term AC (would opt for Eliquis) on d/c, can be started per CVTS discretion once TPW removed.  Diuresed well o/n & today - actually had missed collection due to incontinence (unable to make it to bathroom in time & no urinal available).  Marykay Lex, MD '

## 2013-10-13 NOTE — Progress Notes (Signed)
1450 Heart rate 130. Will hold ambulation and follow up tomorrow. Piera Downs RNBSN 10/13/2013.2:50 PM

## 2013-10-13 NOTE — Progress Notes (Signed)
Patient remains in At Flutter rate 120's Patient voiced no complaints. R.N. Aware and Dr. Tyrone Sage paged and made aware no orders given. Cont. To monitor pat. And rhythm.

## 2013-10-13 NOTE — Progress Notes (Signed)
Patient h.r is 120-140's at. Flutter. EKG to confirm  Done. R.N. Aware and Dr. Tyrone Sage paged and aware of today's events. See order to give 10 pm lopressor 12.5mg  po now and give dose of amiodarone 400 mg p.o. Now.

## 2013-10-13 NOTE — Progress Notes (Addendum)
      [address]       [place] [address]             [phone]        3 Days Post-Op Procedure(s) (LRB): CORONARY ARTERY BYPASS GRAFTING (CABG) (N/A) INTRAOPERATIVE TRANSESOPHAGEAL ECHOCARDIOGRAM (N/A)  Subjective: Patient has no specific complaints. He had a bowel movement this am.  Objective: Vital signs in last 24 hours: Temp:  [97.3 F (36.3 C)-99 F (37.2 C)] 98.3 F (36.8 C) (10/21 5621) Pulse Rate:  [66-137] 137 (10/21 3086) Cardiac Rhythm:  [-] Atrial fibrillation;Atrial flutter;Normal sinus rhythm (10/20 1945) Resp:  [13-22] 18 (10/21 0638) BP: (93-124)/(56-82) 115/82 mmHg (10/21 0638) SpO2:  [94 %-98 %] 95 % (10/21 5784) Weight:  [104.1 kg (229 lb 8 oz)] 104.1 kg (229 lb 8 oz) (10/21 6962)  Pre op weight  99 kg Current Weight  10/13/13 104.1 kg (229 lb 8 oz)      Intake/Output from previous day: 10/20 0701 - 10/21 0700 In: 526.7 [P.O.:480; I.V.:46.7] Out: 660 [Urine:660]   Physical Exam:  Cardiovascular: Tachycardic Pulmonary: Diminished at bases; no rales, wheezes, or rhonchi. Abdomen: Soft, non tender, bowel sounds present. Extremities: Mild bilateral lower extremity edema. Wounds: Clean and dry.  No erythema or signs of infection.  Lab Results: CBC: Recent Labs  10/11/13 1700 10/11/13 1727 10/12/13 0352  WBC 12.3*  --  13.0*  HGB 11.9* 10.9* 12.0*  HCT 34.4* 32.0* 34.9*  PLT 91*  --  91*   BMET:  Recent Labs  10/12/13 0352 10/13/13 0538  NA 136 139  K 4.0 4.5  CL 102 103  CO2 27 29  GLUCOSE 99 104*  BUN 17 22  CREATININE 1.04 1.03  CALCIUM 8.0* 8.0*    PT/INR:  Lab Results  Component Value Date   INR 1.52* 10/10/2013   INR 1.19 10/09/2013   ABG:  INR: Will add last result for INR, ABG once components are confirmed Will add last 4 CBG results once components are confirmed  Assessment/Plan:  1. CV - Previous A fib/flutter. HR 130's and questionable ST. On Amiodarone 400 daily, Lopressor 12.5 bid.  Will increase Lopressor to 25 bid as HR increased no further bradycardia. 2.  Pulmonary - Encourage incentive spirometer 3. Volume Overload - On Lasix 40 daily. Will give IV this am 4.  Acute blood loss anemia - Last H and H 12 and 34.9 5.Thrombocytopenia-last platelet 91,000 6.Continue CRPI  ZIMMERMAN,DONIELLE MPA-C 10/13/2013,7:20 AM   Chart reviewed, patient examined, agree with above. He is in A-fib this afternoon. He is to receive 150 mg amio bolus and continue 400 bid po. He will need anticoagulation. Dr. Herbie Baltimore would like to use Eliquis so will wait until pacing wires are out to start.

## 2013-10-14 ENCOUNTER — Inpatient Hospital Stay (HOSPITAL_COMMUNITY): Payer: Medicare Other

## 2013-10-14 LAB — GLUCOSE, CAPILLARY
Glucose-Capillary: 133 mg/dL — ABNORMAL HIGH (ref 70–99)
Glucose-Capillary: 86 mg/dL (ref 70–99)

## 2013-10-14 MED ORDER — ASPIRIN 300 MG RE SUPP: 300.0000 mg | Freq: Every day | RECTAL | Status: DC

## 2013-10-14 MED ORDER — DILTIAZEM LOAD VIA INFUSION
10.0000 mg | Freq: Once | INTRAVENOUS | Status: AC
  Administered 2013-10-14: 10 mg via INTRAVENOUS
  Filled 2013-10-14: qty 10

## 2013-10-14 MED ORDER — FUROSEMIDE 10 MG/ML IJ SOLN
40.0000 mg | Freq: Every day | INTRAMUSCULAR | Status: DC
  Administered 2013-10-14: 40 mg via INTRAVENOUS
  Filled 2013-10-14 (×2): qty 4

## 2013-10-14 MED ORDER — PANTOPRAZOLE SODIUM 40 MG IV SOLR
40.0000 mg | INTRAVENOUS | Status: DC
  Administered 2013-10-14: 40 mg via INTRAVENOUS
  Filled 2013-10-14: qty 40

## 2013-10-14 MED ORDER — DILTIAZEM HCL 100 MG IV SOLR
10.0000 mg/h | INTRAVENOUS | Status: DC
  Administered 2013-10-14 – 2013-10-15 (×2): 10 mg/h via INTRAVENOUS
  Filled 2013-10-14 (×2): qty 100

## 2013-10-14 MED ORDER — ASPIRIN EC 325 MG PO TBEC
325.0000 mg | DELAYED_RELEASE_TABLET | Freq: Every day | ORAL | Status: DC
  Filled 2013-10-14: qty 1

## 2013-10-14 MED ORDER — AMIODARONE HCL IN DEXTROSE 360-4.14 MG/200ML-% IV SOLN
30.0000 mg/h | INTRAVENOUS | Status: DC
  Filled 2013-10-14 (×2): qty 200

## 2013-10-14 MED ORDER — LACTULOSE 10 GM/15ML PO SOLN
20.0000 g | Freq: Once | ORAL | Status: AC
  Administered 2013-10-14: 20 g via ORAL
  Filled 2013-10-14: qty 30

## 2013-10-14 MED ORDER — AMIODARONE HCL 200 MG PO TABS
400.0000 mg | ORAL_TABLET | Freq: Two times a day (BID) | ORAL | Status: DC
  Administered 2013-10-14 – 2013-10-20 (×12): 400 mg via ORAL
  Filled 2013-10-14 (×12): qty 2

## 2013-10-14 MED ORDER — METOPROLOL TARTRATE 1 MG/ML IV SOLN
5.0000 mg | Freq: Four times a day (QID) | INTRAVENOUS | Status: DC
  Administered 2013-10-14: 5 mg via INTRAVENOUS
  Filled 2013-10-14 (×2): qty 5

## 2013-10-14 MED ORDER — PANTOPRAZOLE SODIUM 40 MG PO TBEC: 40.0000 mg | DELAYED_RELEASE_TABLET | Freq: Every day | ORAL | Status: DC

## 2013-10-14 MED ORDER — POTASSIUM CHLORIDE CRYS ER 20 MEQ PO TBCR
20.0000 meq | EXTENDED_RELEASE_TABLET | Freq: Every day | ORAL | Status: DC
Start: 2013-10-15 — End: 2013-10-20
  Administered 2013-10-15 – 2013-10-20 (×6): 20 meq via ORAL
  Filled 2013-10-14 (×6): qty 1

## 2013-10-14 MED ORDER — DILTIAZEM HCL 100 MG IV SOLR
10.0000 mg/h | INTRAVENOUS | Status: DC
  Administered 2013-10-14: 10 mg/h via INTRAVENOUS
  Filled 2013-10-14 (×2): qty 100

## 2013-10-14 MED FILL — Sodium Bicarbonate IV Soln 8.4%: INTRAVENOUS | Qty: 50 | Status: AC

## 2013-10-14 MED FILL — Sodium Chloride Irrigation Soln 0.9%: Qty: 3000 | Status: AC

## 2013-10-14 MED FILL — Heparin Sodium (Porcine) Inj 1000 Unit/ML: INTRAMUSCULAR | Qty: 20 | Status: AC

## 2013-10-14 MED FILL — Electrolyte-R (PH 7.4) Solution: INTRAVENOUS | Qty: 4000 | Status: AC

## 2013-10-14 MED FILL — Lidocaine HCl IV Inj 20 MG/ML: INTRAVENOUS | Qty: 5 | Status: AC

## 2013-10-14 MED FILL — Mannitol IV Soln 20%: INTRAVENOUS | Qty: 500 | Status: AC

## 2013-10-14 NOTE — Progress Notes (Signed)
Started patient on clear liquid diet per PA; walked patient in room HR reached 130's; patient is currently sitting in chair HR 104; will continue to monitor.  Thom[place], Alfred Baseman

## 2013-10-14 NOTE — Progress Notes (Signed)
PT now with controlled Afib/flutter, rate in 80s for the past hour. RN requests that he rest for now. Nursing will walk with him in the evening. Will f/u am. Ethelda Chick CES, ACSM 2:13 PM 10/14/2013

## 2013-10-14 NOTE — Progress Notes (Signed)
Subjective:  When I went to see the pt he was in the bathroom- he said he felt like he was "going to pass out". HR 130-140  Objective:  Vital Signs in the last 24 hours: Temp:  [98.1 F (36.7 C)-98.7 F (37.1 C)] 98.4 F (36.9 C) (10/22 0355) Pulse Rate:  [129-135] 132 (10/21 1933) Resp:  [20] 20 (10/22 0355) BP: (95-116)/(63-79) 97/63 mmHg (10/22 0355) SpO2:  [95 %-97 %] 95 % (10/22 0355) Weight:  [225 lb 12 oz (102.4 kg)] 225 lb 12 oz (102.4 kg) (10/22 0100)  Intake/Output from previous day:  Intake/Output Summary (Last 24 hours) at 10/14/13 1034 Last data filed at 10/14/13 0600  Gross per 24 hour  Intake    240 ml  Output    625 ml  Net   -385 ml    Physical Exam: General appearance: alert, cooperative, mild distress, morbidly obese and pale Lungs: decreased breath sounds Heart: irregularly irregular rhythm Abdomen: distended BS diminished, non tender   Rate: 140  Rhythm: atrial fibrillation  Lab Results:  Recent Labs  10/11/13 1700 10/11/13 1727 10/12/13 0352  WBC 12.3*  --  13.0*  HGB 11.9* 10.9* 12.0*  PLT 91*  --  91*    Recent Labs  10/12/13 0352 10/13/13 0538  NA 136 139  K 4.0 4.5  CL 102 103  CO2 27 29  GLUCOSE 99 104*  BUN 17 22  CREATININE 1.04 1.03   No results found for this basename: TROPONINI, CK, MB,  in the last 72 hours No results found for this basename: INR,  in the last 72 hours  Imaging: No results found.  Cardiac Studies:  Assessment/Plan:   Principal Problem:   Unstable angina Active Problems:   CAD - severe at cath - CABG X 4 10/10/13   Atrial fibrillation with RVR  pre and post op   Obesity (BMI 30.0-34.9)    PLAN: He is not tolerating AF, B/P soft. It also appears he has an ileus post op. Will change meds to IV- B/P currently stable at 120 systolic- start IV Diltiazem.  Corine Shelter PA-C Beeper [phone] 10/14/2013, 10:34 AM   Had a pretty bad night with diuresis & GI bloating/gas.   Abdomen is actually a  bit less tense than yesterday & is passing flatus.  Will Check KUB to eval for Ileus.  Currently meds have been converted to IV from PO.  He remains in Afib/Flutter, ut is now rate controlled with IV Diltiazem. IV Amiodarone to start Pending decision re: +/- Ileus & NPO or not. If he continues in Afib/Flutter despite re-bolus of Amiodarone, may consider TEE/DCCV - would need to initiate Eliquis first (although having PM wires in place is helpful while working on rate-rhythm control).  He may convert chemically with re-initiation of amiodarone.  He has had significant diuresis today, would be nice to convert to PO once back to taking PO.   Marykay Lex, M.D., M.S. [facility] [address] [place], [place]  [address]  [phone] Pager # [phone] 10/14/2013 1:19 PM

## 2013-10-14 NOTE — Progress Notes (Signed)
BP 96/59 MD notified; MD orders to keep Cardizem drip at same rate of 10; Will notify MD if systolic falls below 90.  BARNETT, Geroge Baseman

## 2013-10-14 NOTE — Progress Notes (Signed)
1015 Heart rate 140s. Will follow up later. Duanne Limerick, RN BSN 10/14/2013 10:18 AM

## 2013-10-14 NOTE — Progress Notes (Addendum)
      [address]       [place] [address]             [phone]        4 Days Post-Op Procedure(s) (LRB): CORONARY ARTERY BYPASS GRAFTING (CABG) (N/A) INTRAOPERATIVE TRANSESOPHAGEAL ECHOCARDIOGRAM (N/A)  Subjective: Patient feels "wiped out" and sometimes he feels his heart racing.  Objective: Vital signs in last 24 hours: Temp:  [98.1 F (36.7 C)-98.7 F (37.1 C)] 98.4 F (36.9 C) (10/22 0355) Pulse Rate:  [129-135] 132 (10/21 1933) Cardiac Rhythm:  [-] Atrial flutter;Normal sinus rhythm (10/21 2000) Resp:  [20] 20 (10/22 0355) BP: (95-116)/(63-79) 97/63 mmHg (10/22 0355) SpO2:  [95 %-97 %] 95 % (10/22 0355) Weight:  [102.4 kg (225 lb 12 oz)] 102.4 kg (225 lb 12 oz) (10/22 0100)  Pre op weight  99 kg Current Weight  10/14/13 102.4 kg (225 lb 12 oz)      Intake/Output from previous day: 10/21 0701 - 10/22 0700 In: 240 [P.O.:240] Out: 625 [Urine:625]   Physical Exam:  Cardiovascular: Tachycardic Pulmonary: Diminished at bases; no rales, wheezes, or rhonchi. Abdomen: Soft, non tender, distention,bowel sounds present. Extremities: Mild bilateral lower extremity edema. Wounds: Clean and dry.  No erythema or signs of infection.  Lab Results: CBC:  Recent Labs  10/11/13 1700 10/11/13 1727 10/12/13 0352  WBC 12.3*  --  13.0*  HGB 11.9* 10.9* 12.0*  HCT 34.4* 32.0* 34.9*  PLT 91*  --  91*   BMET:   Recent Labs  10/12/13 0352 10/13/13 0538  NA 136 139  K 4.0 4.5  CL 102 103  CO2 27 29  GLUCOSE 99 104*  BUN 17 22  CREATININE 1.04 1.03  CALCIUM 8.0* 8.0*    PT/INR:  Lab Results  Component Value Date   INR 1.52* 10/10/2013   INR 1.19 10/09/2013   ABG:  INR: Will add last result for INR, ABG once components are confirmed Will add last 4 CBG results once components are confirmed  Assessment/Plan:  1. CV - Previous A fib/flutter. Given Amiodarone bolus x 2 yesterday as well as Lopressor IV.HR continues to be in 130's and  on Amiodarone 400 daily, Lopressor 25 bid. Will start Cardizem gttp for better HR control.Eliquis to be started once EPW removed. 2.  Pulmonary - Encourage incentive spirometer 3. Volume Overload - On Lasix 40 daily.  4.  Acute blood loss anemia - Last H and H 12 and 34.9 5.Thrombocytopenia-last platelet 91,000 6.Continue CRPI 7.On backup pacing at 50. We will stop. Remove EPW in am 8.LOC constipation, at patient's request  ZIMMERMAN,DONIELLE MPA-C 10/14/2013,7:32 AM    Chart reviewed, patient examined, agree with above. He remained in A-flutter today on IV cardizem. Unfortunately he did not receive amiodarone today because his po dose was switched to IV and it was just about to start. IV amio will cause phlebitis peripherally so I will switch it back to po. There was apparently some concern about ileus but he is passing a lot of gas, has excellent bowel sounds, has unremarkable KUB and a large belly as baseline.  Start clear liquids back now and advance to regular in am.  I rapid A-paced into sinus 70's without difficulty. His BP is only in the 90's so will hold off on lopressor tonight. He may be better off on amio and cardizem since lopressor did nothing to control his rate.

## 2013-10-15 LAB — GLUCOSE, CAPILLARY
Glucose-Capillary: 130 mg/dL — ABNORMAL HIGH (ref 70–99)
Glucose-Capillary: 125 mg/dL — ABNORMAL HIGH (ref 70–99)
Glucose-Capillary: 99 mg/dL (ref 70–99)

## 2013-10-15 LAB — CBC
Platelets: 189 10*3/uL (ref 150–400)
RDW: 13.5 % (ref 11.5–15.5)
WBC: 10 10*3/uL (ref 4.0–10.5)

## 2013-10-15 MED ORDER — PANTOPRAZOLE SODIUM 40 MG PO TBEC
40.0000 mg | DELAYED_RELEASE_TABLET | Freq: Every day | ORAL | Status: DC
  Administered 2013-10-15 – 2013-10-20 (×6): 40 mg via ORAL
  Filled 2013-10-15 (×6): qty 1

## 2013-10-15 MED ORDER — ASPIRIN EC 325 MG PO TBEC
325.0000 mg | DELAYED_RELEASE_TABLET | Freq: Every day | ORAL | Status: DC
  Administered 2013-10-15 – 2013-10-18 (×4): 325 mg via ORAL
  Filled 2013-10-15 (×5): qty 1

## 2013-10-15 MED ORDER — FUROSEMIDE 40 MG PO TABS
40.0000 mg | ORAL_TABLET | Freq: Every day | ORAL | Status: DC
  Administered 2013-10-15 – 2013-10-20 (×6): 40 mg via ORAL
  Filled 2013-10-15 (×6): qty 1

## 2013-10-15 MED ORDER — DILTIAZEM HCL 60 MG PO TABS
60.0000 mg | ORAL_TABLET | Freq: Four times a day (QID) | ORAL | Status: DC
  Administered 2013-10-15 – 2013-10-20 (×22): 60 mg via ORAL
  Filled 2013-10-15 (×25): qty 1

## 2013-10-15 MED ORDER — ATORVASTATIN CALCIUM 40 MG PO TABS
40.0000 mg | ORAL_TABLET | Freq: Every day | ORAL | Status: DC
  Administered 2013-10-16 – 2013-10-19 (×5): 40 mg via ORAL
  Filled 2013-10-15 (×6): qty 1

## 2013-10-15 NOTE — Progress Notes (Signed)
CARDIAC REHAB PHASE I   PRE:  Rate/Rhythm: 75SR  BP:  Supine:   Sitting: 116/67  Standing:    SaO2: 96%RA  MODE:  Ambulation: 425 ft   POST:  Rate/Rhythm: 76SR  BP:  Supine:   Sitting: 119/69  Standing:    SaO2: 95%RA 4098-1191 Pt states this is first walk in hall. Happy to be up but also a little nervous. Tolerated well. Remained in NSR. Walked 425 ft with his rolling walker with steady gait. To recliner after walk. Encouraged walks with staff.   Luetta Nutting, RN BSN  10/15/2013 8:24 AM

## 2013-10-15 NOTE — Progress Notes (Addendum)
      [address]       [place] [address]             [phone]        5 Days Post-Op Procedure(s) (LRB): CORONARY ARTERY BYPASS GRAFTING (CABG) (N/A) INTRAOPERATIVE TRANSESOPHAGEAL ECHOCARDIOGRAM (N/A)  Subjective: Patient feeling much better this am. He denies nausea, emesis, and abdominal pain. Passing flatus. Requesting a regular food.  Objective: Vital signs in last 24 hours: Temp:  [97.5 F (36.4 C)-99.5 F (37.5 C)] 97.5 F (36.4 C) (10/23 0540) Pulse Rate:  [72-134] 76 (10/23 0540) Cardiac Rhythm:  [-] Atrial flutter;Normal sinus rhythm (10/22 0757) Resp:  [20] 20 (10/23 0540) BP: (96-124)/(59-78) 121/72 mmHg (10/23 0540) SpO2:  [93 %-96 %] 96 % (10/23 0540) Weight:  [99.61 kg (219 lb 9.6 oz)] 99.61 kg (219 lb 9.6 oz) (10/23 0540)  Pre op weight  99 kg Current Weight  10/15/13 99.61 kg (219 lb 9.6 oz)      Intake/Output from previous day: 10/22 0701 - 10/23 0700 In: 20796.6 [P.O.:480; I.V.:20316.6] Out: 1650 [Urine:1650]   Physical Exam:  Cardiovascular: RRR Pulmonary: Slightly diminished at bases; no rales, wheezes, or rhonchi. Abdomen: Soft, non tender, distention,bowel sounds present. Extremities: Mild bilateral lower extremity edema. Wounds: Clean and dry.  No erythema or signs of infection.  Lab Results: CBC:  Recent Labs  10/15/13 0400  WBC 10.0  HGB 11.9*  HCT 34.3*  PLT 189   BMET:   Recent Labs  10/13/13 0538  NA 139  K 4.5  CL 103  CO2 29  GLUCOSE 104*  BUN 22  CREATININE 1.03  CALCIUM 8.0*    PT/INR:  Lab Results  Component Value Date   INR 1.52* 10/10/2013   INR 1.19 10/09/2013   ABG:  INR: Will add last result for INR, ABG once components are confirmed Will add last 4 CBG results once components are confirmed  Assessment/Plan:  1. CV - Previous A fib/flutter.Difficult to control HR post op, despite using Amiodarone and Lopressor. Cardizem gttp seemed to work best. On Amiodarone 400  bid.Dr. Laneta Simmers rapid A paced him yesterday and he converted to sinus rhythm. HR in the 70's this am. Stop Cardizem gttp and start oral 60 qid. Eliquis may be started upon discharge. 2.  Pulmonary - Encourage incentive spirometer 3. Volume Overload - On Lasix 40 daily.  4.  Acute blood loss anemia - Last H and H 11.9 and 34.3 5.Thrombocytopenia resolved-last platelet 189,000 6.Remove EPW in am, provided not bradycardic 7. GI-passing flatus, no symptoms. Will restart heart healthy diet and oral medicines 8. Continue CRPI  ZIMMERMAN,DONIELLE MPA-C 10/15/2013,7:32 AM     Chart reviewed, patient examined, agree with above. He looks much better today. If he maintains sinus will plan to remove pacing wires tomorrow and then start Eliquis the next day and send home.

## 2013-10-15 NOTE — Progress Notes (Signed)
Subjective: Feeling much better now that he is in NSR. No chest pain or SOB. He has been ambulating w/ CR w/ little difficulty. Still no BM, but passing flatus.   Objective: Vital signs in last 24 hours: Temp:  [97.5 F (36.4 C)-99.5 F (37.5 C)] 98.5 F (36.9 C) (10/23 0923) Pulse Rate:  [71-134] 71 (10/23 0923) Resp:  [20] 20 (10/23 0923) BP: (96-124)/(59-78) 117/69 mmHg (10/23 0923) SpO2:  [93 %-96 %] 94 % (10/23 0923) Weight:  [219 lb 9.6 oz (99.61 kg)] 219 lb 9.6 oz (99.61 kg) (10/23 0540) Last BM Date: 10/13/13  Intake/Output from previous day: 10/22 0701 - 10/23 0700 In: 20796.6 [P.O.:480; I.V.:20316.6] Out: 1650 [Urine:1650] Intake/Output this shift: Total I/O In: 500 [P.O.:500] Out: -   Medications Current Facility-Administered Medications  Medication Dose Route Frequency Provider Last Rate Last Dose  . 0.9 %  sodium chloride infusion  250 mL Intravenous PRN Alleen Borne, MD      . acetaminophen (TYLENOL) tablet 650 mg  650 mg Oral Q6H PRN Alleen Borne, MD   650 mg at 10/14/13 2221  . amiodarone (PACERONE) tablet 400 mg  400 mg Oral BID Alleen Borne, MD   400 mg at 10/14/13 1838  . aspirin EC tablet 325 mg  325 mg Oral Daily Donielle M Zimmerman, PA-C      . atorvastatin (LIPITOR) tablet 40 mg  40 mg Oral q1800 Donielle Margaretann Loveless, PA-C      . bisacodyl (DULCOLAX) EC tablet 10 mg  10 mg Oral Daily PRN Alleen Borne, MD   10 mg at 10/12/13 1115   Or  . bisacodyl (DULCOLAX) suppository 10 mg  10 mg Rectal Daily PRN Alleen Borne, MD      . diltiazem (CARDIZEM) tablet 60 mg  60 mg Oral Q6H Donielle Margaretann Loveless, PA-C   60 mg at 10/15/13 1610  . furosemide (LASIX) tablet 40 mg  40 mg Oral Daily Donielle Margaretann Loveless, PA-C      . insulin aspart (novoLOG) injection 0-24 Units  0-24 Units Subcutaneous TID AC & HS Alleen Borne, MD   2 Units at 10/15/13 [phone]  . ondansetron (ZOFRAN) tablet 4 mg  4 mg Oral Q6H PRN Alleen Borne, MD       Or  . ondansetron  (ZOFRAN) injection 4 mg  4 mg Intravenous Q6H PRN Alleen Borne, MD   4 mg at 10/12/13 1706  . pantoprazole (PROTONIX) EC tablet 40 mg  40 mg Oral Daily Donielle M Zimmerman, PA-C      . potassium chloride SA (K-DUR,KLOR-CON) CR tablet 20 mEq  20 mEq Oral Daily Donielle Margaretann Loveless, PA-C      . sodium chloride 0.9 % injection 3 mL  3 mL Intravenous Q12H Alleen Borne, MD   3 mL at 10/14/13 2221  . sodium chloride 0.9 % injection 3 mL  3 mL Intravenous PRN Alleen Borne, MD        PE: General appearance: alert, cooperative and no distress Lungs: clear to auscultation bilaterally Heart: regular rate and rhythm, S1, S2 normal, no murmur, click, rub or gallop Extremities: 1+ LEE on the left, trace edema on the right Pulses: 2+ and symmetric Skin: warm and dry Neurologic: Grossly normal  Lab Results:   Recent Labs  10/15/13 0400  WBC 10.0  HGB 11.9*  HCT 34.3*  PLT 189   BMET  Recent Labs  10/13/13 0538  NA 139  K 4.5  CL 103  CO2 29  GLUCOSE 104*  BUN 22  CREATININE 1.03  CALCIUM 8.0*    Assessment/Plan  Principal Problem:   Unstable angina Active Problems:   Obesity (BMI 30.0-34.9)   Atrial fibrillation with RVR  pre and post op   CAD - severe at cath - CABG X 4 10/10/13  Plan: Day 5 s/p CABG. Post-operative course complicated by rapid a fib/flutter. Pt was rapid A paced yesterday by Dr. Collier Bullock into NSR. He remains in NSR. HR in the 70s. BP is stable. He is now transitioning from IV to PO Cardizem Q6H. Continue PO Amiodarone BID. ? Need for oral anticoagulation. He still has not produced a BM, but has been passing flatus. Abdominal plain film yesterday demonstrated no disproportionate dilatation of bowel/ nonobstructive bowel gas pattern.  He remains fluid overloaded with significant LEE. Continue PO Lasix. K+ is WNL. No recommended changes in therapy for today. Will continue to follow along.     LOS: 6 days    Allayne Butcher, PA-C 10/15/2013 10:02  AM  I have seen and examined the patient along with Roxy Horseman SIMMONS, PA-C.  I have reviewed the chart, notes and new data.  I agree with PA's note.  PLAN: Keep on amiodarone for 30 days postop. Also needs an oral anticoagulant  - would use a NOAC such as Eliquis or Xarelto, but would start only after pacing wires have been removed. He had AF preoperatively as well as postop.  Thurmon Fair, MD, [facility] [phone] 10/15/2013, 2:49 PM

## 2013-10-15 NOTE — Care Management Note (Addendum)
Patient ambulated 550 ft in hallway with RW. V/s stable, hrt rate 82 NSR.Patient tolerated ambulation. Will continue to monitor.    Laasia Arcos,RN,MSN

## 2013-10-16 LAB — GLUCOSE, CAPILLARY
Glucose-Capillary: 97 mg/dL (ref 70–99)
Glucose-Capillary: 99 mg/dL (ref 70–99)

## 2013-10-16 MED ORDER — DIGOXIN 125 MCG PO TABS
0.1250 mg | ORAL_TABLET | Freq: Every day | ORAL | Status: DC
  Administered 2013-10-17 – 2013-10-20 (×4): 0.125 mg via ORAL
  Filled 2013-10-16 (×4): qty 1

## 2013-10-16 MED ORDER — DIGOXIN 0.25 MG/ML IJ SOLN
0.2500 mg | Freq: Once | INTRAMUSCULAR | Status: AC
  Administered 2013-10-16: 0.25 mg via INTRAVENOUS
  Filled 2013-10-16 (×2): qty 1

## 2013-10-16 MED ORDER — DIGOXIN 0.25 MG/ML IJ SOLN
0.5000 mg | Freq: Once | INTRAMUSCULAR | Status: AC
  Administered 2013-10-16: 0.5 mg via INTRAVENOUS

## 2013-10-16 MED ORDER — LISINOPRIL 2.5 MG PO TABS
2.5000 mg | ORAL_TABLET | Freq: Every day | ORAL | Status: DC
  Filled 2013-10-16: qty 1

## 2013-10-16 MED ORDER — MAGIC MOUTHWASH
5.0000 mL | Freq: Four times a day (QID) | ORAL | Status: DC
  Administered 2013-10-16 – 2013-10-20 (×15): 5 mL via ORAL
  Filled 2013-10-16 (×21): qty 5

## 2013-10-16 MED ORDER — ENOXAPARIN SODIUM 40 MG/0.4ML ~~LOC~~ SOLN
40.0000 mg | Freq: Every day | SUBCUTANEOUS | Status: DC
  Administered 2013-10-16 – 2013-10-18 (×3): 40 mg via SUBCUTANEOUS
  Filled 2013-10-16 (×4): qty 0.4

## 2013-10-16 NOTE — Discharge Summary (Signed)
[address]       [place] [address]             [phone]              Discharge Summary  Name: Alfred Thompson DOB: November 16, 1946 67 y.o. MRN: [id number]   Admission Date: 10/09/2013 Discharge Date: 10/20/2013    Admitting Diagnosis: Chest pain  Atrial fibrillation/atrial flutter Expected postoperative blood loss anemia Postoperative thrombocytopenia    Discharge Diagnosis:  Severe left main/three vessel coronary artery disease Unstable angina Atrial fibrillation   Past Medical History  Diagnosis Date  . Back pain, chronic   . Obesity (BMI 30-39.9)   . S/P CABG x 4 10/10/2013    (LIMA to LAD, SVG to OM, SVG to RAMUS INTERMEDIATE, and SVG to RCA  . Unstable angina 10/09/2013  . CAD (coronary artery disease), native coronary artery 10/09/2013  . Paroxysmal a-fib 10/09/2013    Admitted with Afib RVR, 2 episodes post-op.     Procedures: CORONARY ARTERY BYPASS GRAFTING x 4 (Left internal mammary artery to left anterior descending, saphenous vein graft to obtuse marginal, saphenous vein graft to ramus, saphenous vein graft to right coronary artery) ENDOSCOPIC VEIN HARVEST LEFT LEG - 10/10/2013   HPI:  The patient is a 67 y.o. male with no history of CAD or chest pain. He says he had a stress test several years ago. He was a runner up until about 15 yrs ago, but stopped running due to ankle injury. On the evening prior to admission,  he developed an uncomfortable feeling in his chest that persisted after taking antacids. He does admit to a similar episode last weekend that lasted 3 hrs and resolved spontaneously. He then developed some discomfort in his arms and asked his wife to bring him to the ER. He arrived here around 8:30 am. He was noted to be in rapid atrial fibrillation. He was seen by cardiology and admitted for evaluation and treatment.     Hospital Course:  The patient was admitted to [facility] on 10/09/2013.His chest pain subsided  with rate control after receiving Diltiazem. His Diltiazem had to be stopped secondary to low BP with a systolic BP in the 90s on 5 mg/hr.  Because his chest pain was consistent with unstable angina, he was started on heparin and taken to the cath lab on 10/09/2013 for cardiac catheterization. This showed 80% distal left main, high grade proximal RCA napkin ring lesion, and high grade LAD/diag with good LV function. He had no further chest discomfort after his rate was controlled. A cardiac surgery consulted was requested for consideration of CABG.  Dr. Laneta Simmers saw the patient and reviewed his films and agreed with the need for surgical revascularization. All risks, benefits and alternatives of surgery were explained in detail, and the patient agreed to proceed.   The patient was taken to the operating room and underwent the above procedure.   The postoperative course has been notable for atrial fibrillation, which has been difficult to control.  He initially was started on Amiodarone and Lopressor, but had recurrences of rapid heart rates.  He was then started on Cardizem for improved rate control.  On 10/15/2103, he was noted to be in atrial flutter despite IV Cardizem and was rapid atrially paced at the bedside back into sinus rhythm by Dr. Laneta Simmers.  However, in the next 24 hours the patient again developed Atrial Flutter and this time he was unable to  be paced out of this rhythm.  He was continued on Amiodarone and Cardizem,Digoxin and Lopressor was also initiated.  Despite these efforts, patient remains in rate controlled A. Fib/flutter.  His pacing wires have been removed and he will be started on Eliquis at discharge.  Patient remains otherwise medically stable.  He is ambulating without difficulty and tolerating his cardiac diet.  Should his rhythm remain stable the patient is potentially ready for discharge in the next 24-48 hours.    Recent vital signs:  Filed Vitals:   10/20/13 0519  BP: 118/75    Pulse: 85  Temp: 98.1 F (36.7 C)  Resp: 18    Recent laboratory studies:  CBC:No results found for this basename: WBC, HGB, HCT, PLT,  in the last 72 hours BMET:  No results found for this basename: NA, K, CL, CO2, GLUCOSE, BUN, CREATININE, CALCIUM,  in the last 72 hours  PT/INR: No results found for this basename: LABPROT, INR,  in the last 72 hours   Discharge Medications:     Medication List    STOP taking these medications       ibuprofen 200 MG tablet  Commonly known as:  ADVIL,MOTRIN      TAKE these medications       amiodarone 400 MG tablet  Commonly known as:  PACERONE  Take 1 tablet (400 mg total) by mouth 2 (two) times daily.     apixaban 5 MG Tabs tablet  Commonly known as:  ELIQUIS  Take 1 tablet (5 mg total) by mouth 2 (two) times daily.     aspirin EC 81 MG tablet  Take 81 mg by mouth daily as needed for pain.     atorvastatin 40 MG tablet  Commonly known as:  LIPITOR  Take 1 tablet (40 mg total) by mouth daily at 6 PM.     calcium carbonate 750 MG chewable tablet  Commonly known as:  TUMS EX  Chew 2 tablets by mouth daily as needed for heartburn.     digoxin 0.125 MG tablet  Commonly known as:  LANOXIN  Take 1 tablet (0.125 mg total) by mouth daily.     diltiazem 120 MG tablet  Commonly known as:  CARDIZEM  Take 1 tablet (120 mg total) by mouth 2 (two) times daily.     metoprolol tartrate 25 MG tablet  Commonly known as:  LOPRESSOR  Take 0.5 tablets (12.5 mg total) by mouth 2 (two) times daily.     Vitamin C Chew  Chew 1 tablet by mouth daily as needed (when feeling like coming down with a cold).     vitamin E 1000 UNIT capsule  Take 1,000 Units by mouth daily.         Discharge Instructions:  The patient is to refrain from driving, heavy lifting or strenuous activity.  May shower daily and clean incisions with soap and water.  May resume regular diet.   Follow Up:  Discharge Orders   Future Appointments Provider Department  Dept Phone   11/03/2013 9:15 AM Marykay Lex, MD [facility] [phone]   11/04/2013 3:00 PM Alleen Borne, MD [facility] [phone]   Future Orders Complete By Expires   Amb Referral to Cardiac Rehabilitation  As directed    Comments:     [place]      Follow-up Information   Follow up with HARDING,DAVID W, MD. Schedule an appointment as soon as possible for a visit on  11/03/2013. (9:15 AM)    Specialty:  Cardiology   Contact information:   [address] [place] [address] [phone]       Follow up with Alleen Borne, MD On 11/04/2013. (Have a chest x-ray at [facility] at 2:00, then see MD at 3:00)    Specialty:  Cardiothoracic Surgery   Contact information:   [address] [place] [address] [phone]         Elenore Rota 10/20/2013, 7:51 AM

## 2013-10-16 NOTE — Progress Notes (Signed)
Subjective: Feels occasional fluttering in chest, but no other symptoms.   Objective: Vital signs in last 24 hours: Temp:  [98.3 F (36.8 C)-98.5 F (36.9 C)] 98.5 F (36.9 C) (10/24 0511) Pulse Rate:  [71-79] 71 (10/24 0511) Resp:  [18-20] 20 (10/24 0511) BP: (120-149)/(57-65) 120/63 mmHg (10/24 0511) SpO2:  [96 %-98 %] 96 % (10/24 0511) Weight:  [218 lb 6.4 oz (99.066 kg)] 218 lb 6.4 oz (99.066 kg) (10/24 0511) Last BM Date: 10/15/13 (small)  Intake/Output from previous day: 10/23 0701 - 10/24 0700 In: 1583 [P.O.:1580; I.V.:3] Out: 1670 [Urine:1670] Intake/Output this shift:    Medications Current Facility-Administered Medications  Medication Dose Route Frequency Provider Last Rate Last Dose  . 0.9 %  sodium chloride infusion  250 mL Intravenous PRN Alleen Borne, MD      . acetaminophen (TYLENOL) tablet 650 mg  650 mg Oral Q6H PRN Alleen Borne, MD   650 mg at 10/14/13 2221  . amiodarone (PACERONE) tablet 400 mg  400 mg Oral BID Alleen Borne, MD   400 mg at 10/16/13 0958  . aspirin EC tablet 325 mg  325 mg Oral Daily Ardelle Balls, PA-C   325 mg at 10/16/13 1610  . atorvastatin (LIPITOR) tablet 40 mg  40 mg Oral q1800 Donielle Margaretann Loveless, PA-C      . bisacodyl (DULCOLAX) EC tablet 10 mg  10 mg Oral Daily PRN Alleen Borne, MD   10 mg at 10/12/13 1115   Or  . bisacodyl (DULCOLAX) suppository 10 mg  10 mg Rectal Daily PRN Alleen Borne, MD      . digoxin (LANOXIN) 0.25 MG/ML injection 0.25 mg  0.25 mg Intravenous Once Alleen Borne, MD      . digoxin (LANOXIN) 0.25 MG/ML injection 0.5 mg  0.5 mg Intravenous Once Alleen Borne, MD      . Melene Muller ON 10/17/2013] digoxin (LANOXIN) tablet 0.125 mg  0.125 mg Oral Daily Alleen Borne, MD      . diltiazem (CARDIZEM) tablet 60 mg  60 mg Oral Q6H Donielle Margaretann Loveless, PA-C   60 mg at 10/16/13 0958  . enoxaparin (LOVENOX) injection 40 mg  40 mg Subcutaneous Daily Alleen Borne, MD      . furosemide (LASIX) tablet  40 mg  40 mg Oral Daily Ardelle Balls, PA-C   40 mg at 10/16/13 0958  . magic mouthwash  5 mL Oral QID Gina L Collins, PA-C      . ondansetron (ZOFRAN) tablet 4 mg  4 mg Oral Q6H PRN Alleen Borne, MD       Or  . ondansetron (ZOFRAN) injection 4 mg  4 mg Intravenous Q6H PRN Alleen Borne, MD   4 mg at 10/12/13 1706  . pantoprazole (PROTONIX) EC tablet 40 mg  40 mg Oral Daily Ardelle Balls, PA-C   40 mg at 10/16/13 9604  . potassium chloride SA (K-DUR,KLOR-CON) CR tablet 20 mEq  20 mEq Oral Daily Ardelle Balls, PA-C   20 mEq at 10/16/13 5409  . sodium chloride 0.9 % injection 3 mL  3 mL Intravenous Q12H Alleen Borne, MD   3 mL at 10/16/13 1000  . sodium chloride 0.9 % injection 3 mL  3 mL Intravenous PRN Alleen Borne, MD        PE: General appearance: alert, cooperative and no distress Lungs: clear to auscultation bilaterally Heart: irregularly irregular rhythm Extremities: trace  bilateral LEE Pulses: 2+ and symmetric Skin: warm and dry Neurologic: Grossly normal  Lab Results:   Recent Labs  10/15/13 0400  WBC 10.0  HGB 11.9*  HCT 34.3*  PLT 189    Assessment/Plan  Principal Problem:   Unstable angina Active Problems:   Obesity (BMI 30.0-34.9)   Atrial fibrillation with RVR  pre and post op   CAD - severe at cath - CABG X 4 10/10/13  Plan: Day 6 s/p CABG. Pt is back in afib/flutter on telemetry. HR in the 110s. Dr. Laneta Simmers attempted to pace pt out of rhythm, but attempt was unsuccessful. Agree with adding digoxin. Continue on Amiodarone and Cardizem.  If not improved tomorrow, may consider re-attempt at BB therapy. BP is stable. Continue PO Lasix for diuresis. MD to follow.     LOS: 7 days    Brittainy M. Sharol Harness, PA-C 10/16/2013 10:48 AM

## 2013-10-16 NOTE — Progress Notes (Addendum)
       [address]       [place] [address]             [phone]          6 Days Post-Op Procedure(s) (LRB): CORONARY ARTERY BYPASS GRAFTING (CABG) (N/A) INTRAOPERATIVE TRANSESOPHAGEAL ECHOCARDIOGRAM (N/A)  Subjective: "I feel a million times better today."  Tolerating diet.  C/o tongue irritation and mouth soreness. Walking in halls without difficulty.    Objective: Vital signs in last 24 hours: Patient Vitals for the past 24 hrs:  BP Temp Temp src Pulse Resp SpO2 Weight  10/16/13 0511 120/63 mmHg 98.5 F (36.9 C) Oral 71 20 96 % 218 lb 6.4 oz (99.066 kg)  10/15/13 2049 134/60 mmHg 98.3 F (36.8 C) Oral 79 20 98 % -  10/15/13 1415 122/57 mmHg 98.3 F (36.8 C) Oral 73 18 97 % -  10/15/13 1132 149/65 mmHg - - - - - -  10/15/13 0923 117/69 mmHg 98.5 F (36.9 C) Oral 71 20 94 % -   Current Weight  10/16/13 218 lb 6.4 oz (99.066 kg)  Pre op weight 99 kg    Intake/Output from previous day: 10/23 0701 - 10/24 0700 In: 1583 [P.O.:1580; I.V.:3] Out: 1670 [Urine:1670]    PHYSICAL EXAM:  Heart: RRR Lungs: Clear Wound: Clean and dry Extremities: Mild LE edema    Lab Results: CBC: Recent Labs  10/15/13 0400  WBC 10.0  HGB 11.9*  HCT 34.3*  PLT 189   BMET: No results found for this basename: NA, K, CL, CO2, GLUCOSE, BUN, CREATININE, CALCIUM,  in the last 72 hours  PT/INR: No results found for this basename: LABPROT, INR,  in the last 72 hours    Assessment/Plan: S/P Procedure(s) (LRB): CORONARY ARTERY BYPASS GRAFTING (CABG) (N/A) INTRAOPERATIVE TRANSESOPHAGEAL ECHOCARDIOGRAM (N/A)  CV- Generally maintaining SR, rare brief run AF.  Will d/c EPWs today. Continue po Amio, Cardizem (?switch Cardizem to CD 240 qd). Start Eliquis after wires out. He is off Lopressor. BPs are trending up, so will start low dose ACE-I.  Vol overload- diurese.  Tongue red, irritated. Will start Magic Mouthwash.  CRPI, pulm toilet.  Hopefully home over  the weekend if he remains stable.   LOS: 7 days    COLLINS,GINA H 10/16/2013   Chart reviewed, patient examined, agree with above. Back in A-flutter 130's now. I tried to RAP him but could not convert. Will continue amio, cardizem and add digoxin for rate control. Hold off on ACE I until he is fully diuresed.

## 2013-10-16 NOTE — Progress Notes (Signed)
Pt. Seen and examined. Agree with the NP/PA-C note as written.  Continues to be in atrial flutter. Tachypacing was unsuccessful today. He was loaded on digoxin. Continue po amiodarone loading. May need cardioversion at some point in the near future.  Chrystie Nose, MD, [facility] Attending Cardiologist [facility]

## 2013-10-16 NOTE — Progress Notes (Signed)
Ambulation held today due to Afib with rates at rest from 110-122.  We have been following patient all day, not appropriate for ambulation until heart rate is more controlled.  Will reassess tomorrow.

## 2013-10-17 LAB — BASIC METABOLIC PANEL
BUN: 16 mg/dL (ref 6–23)
CO2: 24 mEq/L (ref 19–32)
Calcium: 8.7 mg/dL (ref 8.4–10.5)
Chloride: 100 mEq/L (ref 96–112)
Creatinine, Ser: 1.11 mg/dL (ref 0.50–1.35)
GFR calc Af Amer: 79 mL/min — ABNORMAL LOW (ref >90)
GFR calc non Af Amer: 68 mL/min — ABNORMAL LOW (ref >90)
Glucose, Bld: 111 mg/dL — ABNORMAL HIGH (ref 70–99)
Potassium: 4.1 mEq/L (ref 3.5–5.1)
Sodium: 135 mEq/L (ref 135–145)

## 2013-10-17 LAB — GLUCOSE, CAPILLARY: Glucose-Capillary: 102 mg/dL — ABNORMAL HIGH (ref 70–99)

## 2013-10-17 MED ORDER — LACTULOSE 10 GM/15ML PO SOLN
30.0000 g | Freq: Every day | ORAL | Status: DC | PRN
  Filled 2013-10-17: qty 45

## 2013-10-17 MED ORDER — METOPROLOL TARTRATE 12.5 MG HALF TABLET
12.5000 mg | ORAL_TABLET | Freq: Two times a day (BID) | ORAL | Status: DC
  Administered 2013-10-17 – 2013-10-20 (×7): 12.5 mg via ORAL
  Filled 2013-10-17 (×8): qty 1

## 2013-10-17 NOTE — Progress Notes (Signed)
Subjective: Feeling much better. No complaints.   Objective: Vital signs in last 24 hours: Temp:  [98.5 F (36.9 C)-98.6 F (37 C)] 98.5 F (36.9 C) (10/25 0416) Pulse Rate:  [64-107] 107 (10/25 1154) Resp:  [18] 18 (10/25 0416) BP: (100-123)/(59-70) 107/70 mmHg (10/25 1154) SpO2:  [96 %-98 %] 98 % (10/25 0416) Weight:  [214 lb 8.1 oz (97.3 kg)] 214 lb 8.1 oz (97.3 kg) (10/25 0416) Last BM Date: 10/17/13  Intake/Output from previous day: 10/24 0701 - 10/25 0700 In: 723 [P.O.:720; I.V.:3] Out: 1500 [Urine:1500] Intake/Output this shift:    Medications Current Facility-Administered Medications  Medication Dose Route Frequency Provider Last Rate Last Dose  . 0.9 %  sodium chloride infusion  250 mL Intravenous PRN Alleen Borne, MD      . acetaminophen (TYLENOL) tablet 650 mg  650 mg Oral Q6H PRN Alleen Borne, MD   650 mg at 10/14/13 2221  . amiodarone (PACERONE) tablet 400 mg  400 mg Oral BID Alleen Borne, MD   400 mg at 10/17/13 0954  . aspirin EC tablet 325 mg  325 mg Oral Daily Ardelle Balls, PA-C   325 mg at 10/17/13 0955  . atorvastatin (LIPITOR) tablet 40 mg  40 mg Oral q1800 Ardelle Balls, PA-C   40 mg at 10/16/13 1738  . bisacodyl (DULCOLAX) EC tablet 10 mg  10 mg Oral Daily PRN Alleen Borne, MD   10 mg at 10/12/13 1115   Or  . bisacodyl (DULCOLAX) suppository 10 mg  10 mg Rectal Daily PRN Alleen Borne, MD      . digoxin (LANOXIN) tablet 0.125 mg  0.125 mg Oral Daily Alleen Borne, MD   0.125 mg at 10/17/13 0955  . diltiazem (CARDIZEM) tablet 60 mg  60 mg Oral Q6H Donielle Margaretann Loveless, PA-C   60 mg at 10/17/13 1154  . enoxaparin (LOVENOX) injection 40 mg  40 mg Subcutaneous Daily Alleen Borne, MD   40 mg at 10/17/13 0956  . furosemide (LASIX) tablet 40 mg  40 mg Oral Daily Ardelle Balls, PA-C   40 mg at 10/17/13 0955  . lactulose (CHRONULAC) 10 GM/15ML solution 30 g  30 g Oral Daily PRN Erin Barrett, PA-C      . magic mouthwash  5 mL  Oral QID Wilmon Pali, PA-C   5 mL at 10/17/13 0957  . metoprolol tartrate (LOPRESSOR) tablet 12.5 mg  12.5 mg Oral BID Erin Barrett, PA-C   12.5 mg at 10/17/13 1154  . ondansetron (ZOFRAN) tablet 4 mg  4 mg Oral Q6H PRN Alleen Borne, MD       Or  . ondansetron (ZOFRAN) injection 4 mg  4 mg Intravenous Q6H PRN Alleen Borne, MD   4 mg at 10/12/13 1706  . pantoprazole (PROTONIX) EC tablet 40 mg  40 mg Oral Daily Ardelle Balls, PA-C   40 mg at 10/17/13 0955  . potassium chloride SA (K-DUR,KLOR-CON) CR tablet 20 mEq  20 mEq Oral Daily Ardelle Balls, PA-C   20 mEq at 10/17/13 0956  . sodium chloride 0.9 % injection 3 mL  3 mL Intravenous Q12H Alleen Borne, MD   3 mL at 10/17/13 0956  . sodium chloride 0.9 % injection 3 mL  3 mL Intravenous PRN Alleen Borne, MD        PE: General appearance: alert, cooperative and moderate distress Lungs: clear to auscultation bilaterally Heart:  irregularly irregular rhythm Extremities: 2+ bilateral LEE Pulses: 2+ and symmetric Skin: warm and dry Neurologic: Grossly normal  Lab Results:   Recent Labs  10/15/13 0400  WBC 10.0  HGB 11.9*  HCT 34.3*  PLT 189   BMET  Recent Labs  10/17/13 0513  NA 135  K 4.1  CL 100  CO2 24  GLUCOSE 111*  BUN 16  CREATININE 1.11  CALCIUM 8.7     Assessment/Plan    Principal Problem:   Unstable angina Active Problems:   Obesity (BMI 30.0-34.9)   Atrial fibrillation with RVR  pre and post op   CAD - severe at cath - CABG X 4 10/10/13  Plan: Day 7 s/p CABG. Atrial fib on telemetry. HR controlled in the 80s. Agree with restarting BB. Lopressor 12.5 mg BID restarted today. Continue Amiodarone, digoxin and cardizem. May need to consider oral anticoagulation. He appears to have more LEE today. Currently on PO Lasix. Consider giving an IV dose. MD to follow.    LOS: 8 days    Brittainy M. Delmer Islam 10/17/2013 1:06 PM   Patient seen and examined. Agree with assessment and  plan.Day 7 s/p CABG x 4 on 10/10/13.  Back to pre-op weight, down 21 lbs from peak weight post-op at 235.  Large BM today. AF on dig, amio, cardizem; beta blocker started. Feels better.  If AF persists, may need anticoagulation.   Lennette Bihari, MD, [facility] 10/17/2013 1:39 PM

## 2013-10-17 NOTE — Progress Notes (Addendum)
      [address]       [place] [address]             [phone]      7 Days Post-Op Procedure(s) (LRB): CORONARY ARTERY BYPASS GRAFTING (CABG) (N/A) INTRAOPERATIVE TRANSESOPHAGEAL ECHOCARDIOGRAM (N/A)  Subjective:  Alfred Thompson continues to not feel well.  He continues to have arrythmia and states he is getting depressed, not being able to do much.  He also thinks that he is not doing well and doesn't know that he will make it out of the hospital.  Objective: Vital signs in last 24 hours: Temp:  [98.5 F (36.9 C)-98.6 F (37 C)] 98.5 F (36.9 C) (10/25 0416) Pulse Rate:  [64-82] 64 (10/25 0416) Cardiac Rhythm:  [-] Atrial fibrillation (10/25 0755) Resp:  [18] 18 (10/25 0416) BP: (100-123)/(59-67) 100/59 mmHg (10/25 0416) SpO2:  [96 %-98 %] 98 % (10/25 0416) Weight:  [214 lb 8.1 oz (97.3 kg)] 214 lb 8.1 oz (97.3 kg) (10/25 0416)  Intake/Output from previous day: 10/24 0701 - 10/25 0700 In: 723 [P.O.:720; I.V.:3] Out: 1500 [Urine:1500]  General appearance: alert, cooperative and no distress Heart: irregularly irregular rhythm Lungs: clear to auscultation bilaterally Abdomen: soft, non-tender; bowel sounds normal; no masses,  no organomegaly Extremities: edema +1 pitting Wound: clean and dry  Lab Results:  Recent Labs  10/15/13 0400  WBC 10.0  HGB 11.9*  HCT 34.3*  PLT 189   BMET:  Recent Labs  10/17/13 0513  NA 135  K 4.1  CL 100  CO2 24  GLUCOSE 111*  BUN 16  CREATININE 1.11  CALCIUM 8.7    PT/INR: No results found for this basename: LABPROT, INR,  in the last 72 hours ABG    Component Value Date/Time   PHART 7.362 10/10/2013 1851   HCO3 22.3 10/10/2013 1851   TCO2 24 10/11/2013 1727   ACIDBASEDEF 3.0* 10/10/2013 1851   O2SAT 89.0 10/10/2013 1851   CBG (last 3)   Recent Labs  10/16/13 1559 10/16/13 2131 10/17/13 0611  GLUCAP 99 100* 102*    Assessment/Plan: S/P Procedure(s) (LRB): CORONARY ARTERY BYPASS GRAFTING  (CABG) (N/A) INTRAOPERATIVE TRANSESOPHAGEAL ECHOCARDIOGRAM (N/A)  1. CV- A.Fib this morning rate in the 90s this morning- on Amiodarone, Cardizem, and digoxin-will start low dose beta blocker this morning and see if patient can tolerate, EP following 2. Pulm- no acute issues, encouraged use of IS 3. Renal-creatinine mildly elevated at 1.11, weight is at baseline, + LE edema, will continue diuresis 4. LOC Constipation- Lactulose prn 5. Dispo- patient with rate controlled A. Fib this morning will attempt beta blocker, EP following, patient may ultimately need cardioversion, continue current care   LOS: 8 days    BARRETT, ERIN 10/17/2013  I have seen and examined the patient and agree with the assessment and plan as outlined.  OWEN,CLARENCE H 10/17/2013 11:33 AM

## 2013-10-17 NOTE — Progress Notes (Signed)
CARDIAC REHAB PHASE I   PRE:  Rate/Rhythm: 86 Afib  BP:  Supine:   Sitting: 105/59  Standing:    SaO2: 98% RA  MODE:  Ambulation: 500 ft   POST:  Rate/Rhythm: 84  BP:  Supine:   Sitting: 120/56  Standing:    SaO2: 98% RA  1343-1421 Pt tolerated ambulation well with assist x1 and pushing a rolling walker. Gait slow, steady, one standing rest break taken, no c/o, VSS. To chair after walk with call bell in reach.   Cristy Hilts, MS, ACSM CES

## 2013-10-18 NOTE — Progress Notes (Signed)
Subjective: No complaints. Had a BM today. Ambulating w/o difficulty.   Objective: Vital signs in last 24 hours: Temp:  [98.1 F (36.7 C)-98.5 F (36.9 C)] 98.3 F (36.8 C) (10/26 0418) Pulse Rate:  [71-107] 83 (10/26 0536) Resp:  [18-20] 18 (10/26 0418) BP: (96-123)/(60-74) 99/65 mmHg (10/26 0536) SpO2:  [96 %-98 %] 96 % (10/26 0418) Weight:  [215 lb 4.8 oz (97.659 kg)] 215 lb 4.8 oz (97.659 kg) (10/26 0418) Last BM Date: 10/18/13  Intake/Output from previous day: 10/25 0701 - 10/26 0700 In: 360 [P.O.:360] Out: -  Intake/Output this shift:    Medications Current Facility-Administered Medications  Medication Dose Route Frequency Provider Last Rate Last Dose  . 0.9 %  sodium chloride infusion  250 mL Intravenous PRN Alleen Borne, MD      . acetaminophen (TYLENOL) tablet 650 mg  650 mg Oral Q6H PRN Alleen Borne, MD   650 mg at 10/17/13 2329  . amiodarone (PACERONE) tablet 400 mg  400 mg Oral BID Alleen Borne, MD   400 mg at 10/17/13 2231  . aspirin EC tablet 325 mg  325 mg Oral Daily Ardelle Balls, PA-C   325 mg at 10/17/13 0955  . atorvastatin (LIPITOR) tablet 40 mg  40 mg Oral q1800 Ardelle Balls, PA-C   40 mg at 10/17/13 1814  . bisacodyl (DULCOLAX) EC tablet 10 mg  10 mg Oral Daily PRN Alleen Borne, MD   10 mg at 10/12/13 1115   Or  . bisacodyl (DULCOLAX) suppository 10 mg  10 mg Rectal Daily PRN Alleen Borne, MD      . digoxin (LANOXIN) tablet 0.125 mg  0.125 mg Oral Daily Alleen Borne, MD   0.125 mg at 10/17/13 0955  . diltiazem (CARDIZEM) tablet 60 mg  60 mg Oral Q6H Donielle Margaretann Loveless, PA-C   60 mg at 10/18/13 0537  . enoxaparin (LOVENOX) injection 40 mg  40 mg Subcutaneous Daily Alleen Borne, MD   40 mg at 10/17/13 0956  . furosemide (LASIX) tablet 40 mg  40 mg Oral Daily Ardelle Balls, PA-C   40 mg at 10/17/13 0955  . lactulose (CHRONULAC) 10 GM/15ML solution 30 g  30 g Oral Daily PRN Erin Barrett, PA-C      . magic mouthwash   5 mL Oral QID Wilmon Pali, PA-C   5 mL at 10/17/13 2231  . metoprolol tartrate (LOPRESSOR) tablet 12.5 mg  12.5 mg Oral BID Erin Barrett, PA-C   12.5 mg at 10/17/13 2231  . ondansetron (ZOFRAN) tablet 4 mg  4 mg Oral Q6H PRN Alleen Borne, MD       Or  . ondansetron (ZOFRAN) injection 4 mg  4 mg Intravenous Q6H PRN Alleen Borne, MD   4 mg at 10/12/13 1706  . pantoprazole (PROTONIX) EC tablet 40 mg  40 mg Oral Daily Ardelle Balls, PA-C   40 mg at 10/17/13 0955  . potassium chloride SA (K-DUR,KLOR-CON) CR tablet 20 mEq  20 mEq Oral Daily Ardelle Balls, PA-C   20 mEq at 10/17/13 0956  . sodium chloride 0.9 % injection 3 mL  3 mL Intravenous Q12H Alleen Borne, MD   3 mL at 10/17/13 2231  . sodium chloride 0.9 % injection 3 mL  3 mL Intravenous PRN Alleen Borne, MD        PE: General appearance: alert, cooperative and no distress Lungs: clear to  auscultation bilaterally Heart: regular rate and rhythm Extremities: 1+ LEE L>R Pulses: 2+ and symmetric Skin: warm and dry Neurologic: Grossly normal  Lab Results:  No results found for this basename: WBC, HGB, HCT, PLT,  in the last 72 hours BMET  Recent Labs  10/17/13 0513  NA 135  K 4.1  CL 100  CO2 24  GLUCOSE 111*  BUN 16  CREATININE 1.11  CALCIUM 8.7    Assessment/Plan  Principal Problem:   Unstable angina Active Problems:   Obesity (BMI 30.0-34.9)   Atrial fibrillation with RVR  pre and post op   CAD - severe at cath - CABG X 4 10/10/13  Plan: Day 8 s/p CABG. Progressing well. HR in the 80s. Continue Po Amio, Cardizem, Lopressor and Digoxin for post-operative afib. Continue  ASA and statin as well. Likely d/c home soon. MD to follow.    LOS: 9 days    Brittainy M. Delmer Islam 10/18/2013 10:12 AM   Patient seen and examined. Agree with assessment and plan. Feels well. Good BM. AF rate in 80's. Ambulate. May need anticoagulation if AF persists post op.    Lennette Bihari, MD,  [facility] 10/18/2013 10:54 AM

## 2013-10-18 NOTE — Progress Notes (Addendum)
      [address]       [place] [address]             [phone]      8 Days Post-Op Procedure(s) (LRB): CORONARY ARTERY BYPASS GRAFTING (CABG) (N/A) INTRAOPERATIVE TRANSESOPHAGEAL ECHOCARDIOGRAM (N/A)  Subjective:  Alfred Thompson states he is feeling much better this morning.  He is ambulating without difficulty. + BM  Objective: Vital signs in last 24 hours: Temp:  [98.1 F (36.7 C)-98.5 F (36.9 C)] 98.3 F (36.8 C) (10/26 0418) Pulse Rate:  [71-107] 83 (10/26 0536) Cardiac Rhythm:  [-] Atrial fibrillation (10/26 0800) Resp:  [18-20] 18 (10/26 0418) BP: (96-123)/(60-74) 99/65 mmHg (10/26 0536) SpO2:  [96 %-98 %] 96 % (10/26 0418) Weight:  [215 lb 4.8 oz (97.659 kg)] 215 lb 4.8 oz (97.659 kg) (10/26 0418)   Intake/Output from previous day: 10/25 0701 - 10/26 0700 In: 360 [P.O.:360] Out: -   General appearance: alert, cooperative and no distress Heart: irregularly irregular rhythm Lungs: clear to auscultation bilaterally Abdomen: soft, non-tender; bowel sounds normal; no masses,  no organomegaly Extremities: edema trace Wound: clean and dry  Lab Results: No results found for this basename: WBC, HGB, HCT, PLT,  in the last 72 hours BMET:  Recent Labs  10/17/13 0513  NA 135  K 4.1  CL 100  CO2 24  GLUCOSE 111*  BUN 16  CREATININE 1.11  CALCIUM 8.7    PT/INR: No results found for this basename: LABPROT, INR,  in the last 72 hours ABG    Component Value Date/Time   PHART 7.362 10/10/2013 1851   HCO3 22.3 10/10/2013 1851   TCO2 24 10/11/2013 1727   ACIDBASEDEF 3.0* 10/10/2013 1851   O2SAT 89.0 10/10/2013 1851   CBG (last 3)   Recent Labs  10/16/13 1559 10/16/13 2131 10/17/13 0611  GLUCAP 99 100* 102*    Assessment/Plan: S/P Procedure(s) (LRB): CORONARY ARTERY BYPASS GRAFTING (CABG) (N/A) INTRAOPERATIVE TRANSESOPHAGEAL ECHOCARDIOGRAM (N/A)  1. CV- A. Fib rate controlled in the 90s- continue Amiodarone, Cardizem, Lopressor,  Digoxin 2. Pulm- no acute issues, encouraged use of IS 3. Renal- volume status remains stable, LE edema improving continue diuresis for now 4. LOC Constipation-resolved 5. Dispo- patient feeling better this morning, in rate controlled A. Fib, tolerating addition of Beta Blocker, continue current care, check Dig level?   LOS: 9 days    BARRETT, ERIN 10/18/2013  I have seen and examined the patient and agree with the assessment and plan as outlined.  Alfred Thompson reports feeling quite well.  He's staying in rate-controlled Afib.  Possible d/c home 1-2 days.  Plan short term anticoagulation using DTI at time of d/c  Lexee Brashears H 10/18/2013 10:51 AM

## 2013-10-18 NOTE — Progress Notes (Signed)
Pt ambulated in hallway 550 ft independently and tolerated activity well on room air. Will continue to monitor.

## 2013-10-19 LAB — GLUCOSE, CAPILLARY
Glucose-Capillary: 97 mg/dL (ref 70–99)
Glucose-Capillary: 88 mg/dL (ref 70–99)

## 2013-10-19 MED ORDER — DIGOXIN 125 MCG PO TABS: 0.1250 mg | ORAL_TABLET | Freq: Every day | ORAL | Status: DC

## 2013-10-19 MED ORDER — APIXABAN 5 MG PO TABS
5.0000 mg | ORAL_TABLET | Freq: Two times a day (BID) | ORAL | Status: DC
  Administered 2013-10-19 – 2013-10-20 (×3): 5 mg via ORAL
  Filled 2013-10-19 (×4): qty 1

## 2013-10-19 MED ORDER — ATORVASTATIN CALCIUM 40 MG PO TABS: 40.0000 mg | ORAL_TABLET | Freq: Every day | ORAL | Status: DC

## 2013-10-19 MED ORDER — ASPIRIN EC 81 MG PO TBEC
81.0000 mg | DELAYED_RELEASE_TABLET | Freq: Every day | ORAL | Status: DC
  Administered 2013-10-19 – 2013-10-20 (×2): 81 mg via ORAL
  Filled 2013-10-19 (×2): qty 1

## 2013-10-19 MED ORDER — AMIODARONE HCL 400 MG PO TABS: 400.0000 mg | ORAL_TABLET | Freq: Two times a day (BID) | ORAL | Status: DC

## 2013-10-19 MED ORDER — DILTIAZEM HCL 120 MG PO TABS: 120.0000 mg | ORAL_TABLET | Freq: Two times a day (BID) | ORAL | Status: DC

## 2013-10-19 MED ORDER — METOPROLOL TARTRATE 25 MG PO TABS: 12.5000 mg | ORAL_TABLET | Freq: Two times a day (BID) | ORAL | Status: DC

## 2013-10-19 NOTE — Progress Notes (Addendum)
      [address]       [place] [address]             [phone]        9 Days Post-Op Procedure(s) (LRB): CORONARY ARTERY BYPASS GRAFTING (CABG) (N/A) INTRAOPERATIVE TRANSESOPHAGEAL ECHOCARDIOGRAM (N/A)  Subjective: Patient already walked this morning. Feeling fairly well.  Objective: Vital signs in last 24 hours: Temp:  [97.5 F (36.4 C)-99 F (37.2 C)] 97.5 F (36.4 C) (10/27 0511) Pulse Rate:  [80-90] 87 (10/27 0511) Cardiac Rhythm:  [-] Atrial fibrillation (10/26 2100) Resp:  [18-20] 20 (10/27 0511) BP: (108-123)/(60-72) 113/60 mmHg (10/27 0511) SpO2:  [97 %-98 %] 98 % (10/27 0511) Weight:  [97.206 kg (214 lb 4.8 oz)] 97.206 kg (214 lb 4.8 oz) (10/27 0511)  Pre op weight  99 kg Current Weight  10/19/13 97.206 kg (214 lb 4.8 oz)          Physical Exam:  Cardiovascular: RRR Pulmonary: Slightly diminished at bases; no rales, wheezes, or rhonchi. Abdomen: Soft, non tender, distention,bowel sounds present. Extremities: Mild bilateral lower extremity edema. Wounds: Clean and dry.  No erythema or signs of infection.  Lab Results: CBC: No results found for this basename: WBC, HGB, HCT, PLT,  in the last 72 hours BMET:   Recent Labs  10/17/13 0513  NA 135  K 4.1  CL 100  CO2 24  GLUCOSE 111*  BUN 16  CREATININE 1.11  CALCIUM 8.7    PT/INR:  Lab Results  Component Value Date   INR 1.52* 10/10/2013   INR 1.19 10/09/2013   ABG:  INR: Will add last result for INR, ABG once components are confirmed Will add last 4 CBG results once components are confirmed  Assessment/Plan:  1. CV - A fib with CVR . SR this am. On Amiodarone 400 bid, Cardizem 60 qid, Digoxin 0.125 daily, and Lopressor 12.5 bid.  2.  Pulmonary - Encourage incentive spirometer 3. Volume Overload - On Lasix 40 daily. Is below pre op weight. Will not need further diuresis after today. 4.  Acute blood loss anemia - Last H and H 11.9 and 34.3 5. Remove EPW 6. Will  discuss discharge disposition with Dr. Jacqlyn Larsen MPA-C 10/19/2013,7:26 AM     Chart reviewed, patient examined, agree with above. I am not sure what his rhythm is. At times it looks regular and at times not. His rate is controlled on current meds. Will check a digoxin level in the am. Remove pacing wires today and plan to send home tomorrow on current meds with early followup with cardiology. Start Eliquis after pacing wires are out.

## 2013-10-19 NOTE — Progress Notes (Signed)
UR Completed.  Mahi Zabriskie Jane [phone] 10/19/2013  

## 2013-10-19 NOTE — Progress Notes (Signed)
Pt has walked x2 today. Apparently no problems with rhythm. Ed completed. Requests his name be sent to [facility].  [id number] Ethelda Chick CES, ACSM 10:01 AM 10/19/2013

## 2013-10-19 NOTE — Progress Notes (Signed)
Subjective: Feels good.  Ambulating well around the unit.  Objective: Vital signs in last 24 hours: Temp:  [97.5 F (36.4 C)-99 F (37.2 C)] 97.5 F (36.4 C) (10/27 0511) Pulse Rate:  [80-90] 87 (10/27 0511) Resp:  [18-20] 20 (10/27 0511) BP: (108-123)/(60-72) 113/60 mmHg (10/27 0511) SpO2:  [97 %-98 %] 98 % (10/27 0511) Weight:  [214 lb 4.8 oz (97.206 kg)] 214 lb 4.8 oz (97.206 kg) (10/27 0511) Last BM Date: 10/18/13  Intake/Output from previous day:   Intake/Output this shift:    Medications Current Facility-Administered Medications  Medication Dose Route Frequency Provider Last Rate Last Dose  . 0.9 %  sodium chloride infusion  250 mL Intravenous PRN Alleen Borne, MD      . acetaminophen (TYLENOL) tablet 650 mg  650 mg Oral Q6H PRN Alleen Borne, MD   650 mg at 10/17/13 2329  . amiodarone (PACERONE) tablet 400 mg  400 mg Oral BID Alleen Borne, MD   400 mg at 10/18/13 2301  . apixaban (ELIQUIS) tablet 5 mg  5 mg Oral BID Alleen Borne, MD      . aspirin EC tablet 81 mg  81 mg Oral Daily Alleen Borne, MD      . atorvastatin (LIPITOR) tablet 40 mg  40 mg Oral q1800 Ardelle Balls, PA-C   40 mg at 10/18/13 1755  . bisacodyl (DULCOLAX) EC tablet 10 mg  10 mg Oral Daily PRN Alleen Borne, MD   10 mg at 10/12/13 1115   Or  . bisacodyl (DULCOLAX) suppository 10 mg  10 mg Rectal Daily PRN Alleen Borne, MD      . digoxin (LANOXIN) tablet 0.125 mg  0.125 mg Oral Daily Alleen Borne, MD   0.125 mg at 10/18/13 1103  . diltiazem (CARDIZEM) tablet 60 mg  60 mg Oral Q6H Donielle Margaretann Loveless, PA-C   60 mg at 10/19/13 4098  . furosemide (LASIX) tablet 40 mg  40 mg Oral Daily Ardelle Balls, PA-C   40 mg at 10/18/13 1103  . lactulose (CHRONULAC) 10 GM/15ML solution 30 g  30 g Oral Daily PRN Erin Barrett, PA-C      . magic mouthwash  5 mL Oral QID Wilmon Pali, PA-C   5 mL at 10/18/13 2302  . metoprolol tartrate (LOPRESSOR) tablet 12.5 mg  12.5 mg Oral BID Erin  Barrett, PA-C   12.5 mg at 10/18/13 2301  . ondansetron (ZOFRAN) tablet 4 mg  4 mg Oral Q6H PRN Alleen Borne, MD       Or  . ondansetron (ZOFRAN) injection 4 mg  4 mg Intravenous Q6H PRN Alleen Borne, MD   4 mg at 10/12/13 1706  . pantoprazole (PROTONIX) EC tablet 40 mg  40 mg Oral Daily Donielle Margaretann Loveless, PA-C   40 mg at 10/18/13 1104  . potassium chloride SA (K-DUR,KLOR-CON) CR tablet 20 mEq  20 mEq Oral Daily Ardelle Balls, PA-C   20 mEq at 10/18/13 1103  . sodium chloride 0.9 % injection 3 mL  3 mL Intravenous Q12H Alleen Borne, MD   3 mL at 10/18/13 2317  . sodium chloride 0.9 % injection 3 mL  3 mL Intravenous PRN Alleen Borne, MD   3 mL at 10/18/13 2302    PE: General appearance: alert, cooperative and no distress Lungs: clear to auscultation bilaterally Heart: irregularly irregular rhythm Extremities: 1+ LEE Pulses: 2+ and symmetric Neurologic:  Grossly normal  Lab Results:  No results found for this basename: WBC, HGB, HCT, PLT,  in the last 72 hours BMET  Recent Labs  10/17/13 0513  NA 135  K 4.1  CL 100  CO2 24  GLUCOSE 111*  BUN 16  CREATININE 1.11  CALCIUM 8.7    Assessment/Plan  Principal Problem:   Unstable angina Active Problems:   Obesity (BMI 30.0-34.9)   Atrial fibrillation with RVR  pre and post op   CAD - severe at cath - CABG X 4 10/10/13  Plan:  POD # 9 CABG Continues in Afib with controlled rate.  Starting Eliquis today.  PO Amio.  Follow up appt was  arranged.   LOS: 10 days    HAGER, BRYAN 10/19/2013 10:49 AM  I have seen and evaluated the patient this AM  along with Wilburt Finlay, PA. I agree with his findings, examination as well as impression recommendations.  Finally seems to have stablized - rate is well controlled (actually looks regular - checking 12 Lead ECG today => on Amiodarone, low dose BB, Digoxin & CCB (can convert to longer acting CCB for d/c).  With persistent Afib - > will need long term AC - starting  Eliquis   On Statin.  Anticipate d/c in AM.  Will need f/u in ~1-2 weeks with me or PA/NP.  Marykay Lex, M.D., M.S. [facility] [address] [place], [place]  [address]  [phone] Pager # [phone] 10/19/2013 11:11 AM

## 2013-10-19 NOTE — Progress Notes (Signed)
EPW's DC'd per order and unit protocol.  All tips intact, sites painted, Pt tolerated very well.  HOB at 30 degrees, Pt understands bedrest for one hour.  Call bell in reach.  VSS, CCMD notified, will monitor closely.  Of note, 12 lead underway at this time.  Pt appears in A-flutter via tele.

## 2013-10-20 MED ORDER — APIXABAN 5 MG PO TABS: 5.0000 mg | ORAL_TABLET | Freq: Two times a day (BID) | ORAL | Status: DC

## 2013-10-20 NOTE — Progress Notes (Addendum)
      [address]       [place] [address]             [phone]        10 Days Post-Op Procedure(s) (LRB): CORONARY ARTERY BYPASS GRAFTING (CABG) (N/A) INTRAOPERATIVE TRANSESOPHAGEAL ECHOCARDIOGRAM (N/A)  Subjective: Patient feels well, no cpmplaints.  Objective: Vital signs in last 24 hours: Temp:  [97.6 F (36.4 C)-98.2 F (36.8 C)] 98.1 F (36.7 C) (10/28 0519) Pulse Rate:  [62-87] 85 (10/28 0519) Cardiac Rhythm:  [-] Normal sinus rhythm;Atrial fibrillation (10/27 1920) Resp:  [18-20] 18 (10/28 0519) BP: (115-120)/(59-75) 118/75 mmHg (10/28 0519) SpO2:  [96 %-98 %] 96 % (10/28 0519) Weight:  [96.571 kg (212 lb 14.4 oz)] 96.571 kg (212 lb 14.4 oz) (10/28 0519)  Pre op weight  99 kg Current Weight  10/20/13 96.571 kg (212 lb 14.4 oz)      10/27 0701 - 10/28 0700 In: 843 [P.O.:840; I.V.:3] Out: 1450 [Urine:1450]   Physical Exam:  Cardiovascular: RRR Pulmonary: Slightly diminished at bases; no rales, wheezes, or rhonchi. Abdomen: Soft, non tender, distention,bowel sounds present. Extremities: Mild bilateral lower extremity edema. Wounds: Clean and dry.  No erythema or signs of infection.  Lab Results: CBC: No results found for this basename: WBC, HGB, HCT, PLT,  in the last 72 hours BMET:  No results found for this basename: NA, K, CL, CO2, GLUCOSE, BUN, CREATININE, CALCIUM,  in the last 72 hours  PT/INR:  Lab Results  Component Value Date   INR 1.52* 10/10/2013   INR 1.19 10/09/2013   ABG:  INR: Will add last result for INR, ABG once components are confirmed Will add last 4 CBG results once components are confirmed  Assessment/Plan:  1. CV - A fib with CVR . SR this am. On Amiodarone 400 bid, Cardizem 60 qid, Digoxin 0.125 daily, and Lopressor 12.5 bid. Eliquis to be started. 2.  Pulmonary - Encourage incentive spirometer 3.  Acute blood loss anemia - Last H and H 11.9 and 34.3 4. Discharge  ZIMMERMAN,DONIELLE  MPA-C 10/20/2013,7:38 AM     Chart reviewed, patient examined, agree with above.

## 2013-10-20 NOTE — Progress Notes (Signed)
1015 Has walked 3 times already. For d/c. Luetta Nutting RN BSN 10:14 AM 10/20/2013

## 2013-10-20 NOTE — Progress Notes (Signed)
Discharge instructions and prescriptions given to pt and pt's wife. Pt verbalized his understanding of discharge information and is stable for discharge with his wife.

## 2013-11-03 ENCOUNTER — Encounter: Payer: Self-pay | Admitting: Cardiology

## 2013-11-03 ENCOUNTER — Other Ambulatory Visit: Payer: Self-pay | Admitting: *Deleted

## 2013-11-03 ENCOUNTER — Ambulatory Visit (INDEPENDENT_AMBULATORY_CARE_PROVIDER_SITE_OTHER): Payer: Medicare Other | Admitting: Cardiology

## 2013-11-03 VITALS — BP 130/60 | HR 75 | Ht 68.0 in | Wt 217.6 lb

## 2013-11-03 DIAGNOSIS — I2510 Atherosclerotic heart disease of native coronary artery without angina pectoris: Secondary | ICD-10-CM

## 2013-11-03 DIAGNOSIS — Z951 Presence of aortocoronary bypass graft: Secondary | ICD-10-CM

## 2013-11-03 DIAGNOSIS — I4891 Unspecified atrial fibrillation: Secondary | ICD-10-CM

## 2013-11-03 DIAGNOSIS — E669 Obesity, unspecified: Secondary | ICD-10-CM

## 2013-11-03 DIAGNOSIS — I209 Angina pectoris, unspecified: Secondary | ICD-10-CM

## 2013-11-03 MED ORDER — METOPROLOL TARTRATE 25 MG PO TABS: 25.0000 mg | ORAL_TABLET | Freq: Two times a day (BID) | ORAL | Status: DC

## 2013-11-03 MED ORDER — DIGOXIN 125 MCG PO TABS: 0.1250 mg | ORAL_TABLET | Freq: Every day | ORAL | Status: DC

## 2013-11-03 MED ORDER — AMIODARONE HCL 400 MG PO TABS: 400.0000 mg | ORAL_TABLET | Freq: Two times a day (BID) | ORAL | Status: DC

## 2013-11-03 MED ORDER — APIXABAN 5 MG PO TABS: 5.0000 mg | ORAL_TABLET | Freq: Two times a day (BID) | ORAL | Status: DC

## 2013-11-03 NOTE — Patient Instructions (Signed)
Stop diltiazem Increase Metoprol to 1 tablet twice a day  At the end of the week start 400 mg  ONCE a day for a month and then decrease to 200 mg  A day.   Try using  Generic Benadryl for sleep aid  If needed  Your physician wants you to follow-up in 3 months Dr Herbie Baltimore.  You will receive a reminder letter in the mail two months in advance. If you don't receive a letter, please call our office to schedule the follow-up appointment.

## 2013-11-03 NOTE — Progress Notes (Signed)
PATIENT: Alfred Thompson MRN: [id number]  DOB: [birth year]   DOV:11/04/2013 PCP: Marga Melnick, MD  Clinic Note: Chief Complaint  Patient presents with  . Follow-up    post CABGx4; bilat LE edema L>R    HPI: Alfred Thompson is a 67 y.o. male with a PMH below who presents today for initial post hospital followup from CABG. He initially presented with recurrent episodes of chest discomfort, weakness and rapid heart beats. His abdomen A. fib RVR that spontaneously converted with beta blocker. This led to resolution of his pain. However it is simply because of the severity of his chest discomfort symptoms is not quite consistent with angina, he was taken to the cardiac catheterization lab and revealed high-grade RCA disease coupled with distal left main near occlusion. He was referred for urgent CABGx4 which was actually done on Saturday morning by Dr. Laneta Simmers. His postop course was complicated by a mild ileus, more notably with recurrent atrial fibrillation. He was loaded on amiodarone and actually treated with overdrive pacing via the epicardial pacer wires which was only partially successful as he had recurrence of A. fib. He was quite symptomatic during his episodes with diaphoresis and discomfort in his chest as well as dyspnea. He did not however have the anginal type symptoms he was having before his CABG. He was discharged on Eliquis for anticoagulation based on his high CHADS2VASC2 Score. He is also continuing his amiodarone load with 400 mg twice a day.  He was discharged on Oct 28, and is due for close followup with Dr. Laneta Simmers. He has not yet stared [facility].  He is currently up to Walking 2 x day ~3 laps 6-10 min.  Not pushing hard. He has a notebook with him that has daily weights, blood pressures and heart rates. All of which are looking quite stable. His weight is actually down 5 pounds from what he weighed upon first arriving at home.  Interval History: He does note occasional sternal chest  discomfort with coughing or sneezing, he still has a little bit of productive cough with phlegm. Otherwise he denies any anginal chest pressure or dyspnea with rest or exertion. He has been sleeping in a recliner more for comfort and he is to avoid sternal pain. But he has no PND orthopnea. He does note that the edema that is on his left leg from the vein extraction sites that he has reduced edema in that foot at the end of the day when he elevates his foot.  He denies any PND or orthopnea.  He has not had any further recurrence of the rapid heart rates are that significant discomfort that he had while back in A. fib. He has had some palpitations, no rapid beats.  The remainder of Cardiovascular ROS: positive for - irregular heartbeat and MSK chest/ sternal pain negative for - dyspnea on exertion, loss of consciousness, orthopnea, palpitations, paroxysmal nocturnal dyspnea, rapid heart rate or shortness of breath: Additional cardiac review of systems -- since hospital discharge: Lightheadedness - no, dizziness - no, syncope/near-syncope - no; TIA/amaurosis fugax - no Melena - no, hematochezia no; hematuria - no; nosebleeds - no; claudication - no  Past Medical History  Diagnosis Date  . Back pain, chronic   . Obesity (BMI 30-39.9)   . S/P CABG x 4 10/10/2013    (LIMA to LAD, SVG to OM, SVG to RAMUS INTERMEDIATE, and SVG to RCA  . Unstable angina 10/09/2013  . CAD (coronary artery disease), native coronary artery 10/09/2013  .  Paroxysmal a-fib 10/09/2013    Admitted with Afib RVR, 2 episodes post-op.   Prior Cardiac Evaluation and Past Surgical History: Past Surgical History  Procedure Laterality Date  . Hernia repair    . Cardiac catheterization  10/09/2013    Distal Left Main 80-90%; ostial/proximal LAD 50-80%, proximal RI 80%, proximal RCA napkin ring 80-90%; relatively normal Circumflex/Lateral OM --> referred for CABG  . Coronary artery bypass graft N/A 10/10/2013    Procedure:  CORONARY ARTERY BYPASS GRAFTING (CABG);  Surgeon: Alleen Borne, MD;  Location: [facility] OR;  Service: Open Heart Surgery;  Laterality: N/A;  Times 4 using left internal mammary artery and endoscopically harvested left saphenous vein  . Intraoperative transesophageal echocardiogram N/A 10/10/2013    Procedure: INTRAOPERATIVE TRANSESOPHAGEAL ECHOCARDIOGRAM;  Surgeon: Alleen Borne, MD;  Location: [facility] OR;  Service: Open Heart Surgery;  Laterality: N/A;  . Dupuytren / palmar fasciotomy Right    No Known Allergies  Current Outpatient Prescriptions  Medication Sig Dispense Refill  . amiodarone (PACERONE) 400 MG tablet Take 1 tablet (400 mg total) by mouth 2 (two) times daily.  60 tablet  11  . apixaban (ELIQUIS) 5 MG TABS tablet Take 1 tablet (5 mg total) by mouth 2 (two) times daily.  60 tablet  11  . atorvastatin (LIPITOR) 40 MG tablet Take 1 tablet (40 mg total) by mouth daily at 6 PM.  30 tablet  1  . digoxin (LANOXIN) 0.125 MG tablet Take 1 tablet (0.125 mg total) by mouth daily.  30 tablet  11  . furosemide (LASIX) 20 MG tablet Take 1 tab daily for 1 week, then as needed for swelling  30 tablet  12  . metoprolol tartrate (LOPRESSOR) 25 MG tablet Take 1 tablet (25 mg total) by mouth 2 (two) times daily.  60 tablet  11   No current facility-administered medications for this visit.   History   Social History Narrative   Married, one sone, no grandchildren. He drives for [employer]   ROS: A comprehensive Review of Systems - Negative except Just generally expected postop tiredness and fatigue with chest wall pain. Mild cough, no fevers or chills. No significant discomfort there is operative sites on his leg. He does have mild lotion or edema on the left leg. He has barely taken any of his pain medications  PHYSICAL EXAM BP 130/60  Pulse 75  Ht 5\' 8"  (1.727 m)  Wt 217 lb 9.6 oz (98.703 kg)  BMI 33.09 kg/m2 Home BP & HR stable as written -- down 5 lb from d/c General appearance: alert,  cooperative, appears stated age, no distress and and weak appearing as expected postoperatively. But otherwise well-nourished and well-groomed. Answers questions appropriate. Quite talkative and reminiscing of his presenting symptoms and postop A. fib symptoms. Neck: no adenopathy, no carotid bruit, no JVD, supple, symmetrical, trachea midline and thyroid not enlarged, symmetric, no tenderness/mass/nodules Lungs: Mostly CTA B., intermittent interstitial sounds. No rales wheezes or rhonchi. Nonlabored with good air movement. Heart: regular rate and rhythm, S1, S2 normal, no murmur, click, rub or gallop and normal apical impulse Abdomen: soft, non-tender; bowel sounds normal; no masses,  no organomegaly Extremities: Well healing vein extraction sites. Mild edema and left lower extremity but relatively normal the right. Pulses: 2+ and symmetric Neurologic: Grossly normal HEENT: [place]/AT, EOMI, MMM, anicteric sclera  [id number] today: Yes Rate: 75 , Rhythm: NSR;  Q in III, biphasic ST-T wave in III, aVF.  Recent Labs: none since post-op.  ASSESSMENT / PLAN: Check lipids in 3 months Drop Amiodarone to 400 mg daily @ end of week; x 1 month - then 200 mg once daily D/c Dilt - increase Metop to 25 mg bid Lasix 20 mg daily x 1 week, then PRN worsening edema.  CAD (coronary artery disease), native coronary artery Fortuitous finding of multivessel CAD do to A. fib-related angina. He was referred for urgent CABG, from which he is recovering. He did have some exertional class II dyspnea and discomfort prior to his A. fib events.  He notes now he feels better than he had before. He is slowly pushing his activity level, has not had any recurrence of any anginal symptoms. He is on low-dose beta blocker and statin. No aspirin or Plavix do to the need for long-term anticoagulation for A. fib.  Plan: Increase metoprolol 25 mg twice a day, DC diltiazem.  Would check echocardiogram after next visit to get  baseline EF postoperative evaluation.  Check lipid panel after next visit (along with LFTs and TFTs)  S/P CABG x 4 Due to see Dr. Laneta Simmers soon. 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  Would defer this decision to Dr. Laneta Simmers. He still has mild postop edema from volume overload. -->   Will order Lasix 20 mg daily x1 week. After that, he he will use as PRN.  He has been referred for Phase 2/Outpatient Cardiac Rehabilitation.   Atrial fibrillation with RVR  pre and post op Quite symptomatic A. fib. Simply because how difficult he was managed he was put back on amiodarone for which he continues to taper. He is also on a beta blocker, calcium channel blocker and digoxin. No recurrence since discharge. However simply based on how symptomatic he was, I intend to keep him controlled with amiodarone.  With that in mind we'll need to check baseline PFTs and other labs after his next visit.  Plan:   DC diltiazem, increase metoprolol 25 twice a day  Continue digoxin  Amiodarone load as follows: Continue 4 mg twice a day 3 and this week --> then decrease to 400 mg daily x1 month, --> then reduce to maintenance dose of 200 mg daily.  Will order PFTs, LFTs and thyroid function tests at next visit  Angina, class II He clearly had a reason for having angina, and essentially failed a stress test with rapid A. Fib. No recurrence of anginal symptoms since his discharge from CABG.  Will order when necessary nitroglycerin at return visit.  Obesity (BMI 30-39.9) He was telling all about his dietary modifications. He is really cut back on his animal proteins. Much lower portions, supplemented with increased vegetables and fruits.  He is gradually picking up his exercise level. An anxiously awaiting cardiac rehabilitation for further guidance. He is quite proud of 5 pounds that he has lost since discharge.    Orders Placed This Encounter    Procedures  . EKG 12-Lead   Meds ordered this encounter  Medications  . metoprolol tartrate (LOPRESSOR) 25 MG tablet    Sig: Take 1 tablet (25 mg total) by mouth 2 (two) times daily.    Dispense:  60 tablet    Refill:  11    Answer:  BARTLE, BRYAN K [[id number]]  . DISCONTD: amiodarone (PACERONE) 400 MG tablet    Sig: Take 1 tablet (400 mg total) by mouth 2 (two) times daily.    Dispense:  60 tablet  Refill:  11    Order Specific Question:  Supervising Provider    Answer:  Evelene Croon K [[id number]]  . amiodarone (PACERONE) 400 MG tablet    Sig: Take 1 tablet (400 mg total) by mouth 2 (two) times daily.    Dispense:  60 tablet    Refill:  11    Order Specific Question:  Supervising Provider    Answer:  Alleen Borne [[id number]]    Followup: 3 months.  DAVID W. Herbie Baltimore, M.D., M.S. [facility] [address] [place], [place]  [address]  [phone] Pager # [phone]

## 2013-11-04 ENCOUNTER — Encounter: Payer: Self-pay | Admitting: Surgery

## 2013-11-04 ENCOUNTER — Encounter: Payer: Self-pay | Admitting: Cardiology

## 2013-11-04 ENCOUNTER — Ambulatory Visit
Admission: RE | Admit: 2013-11-04 | Discharge: 2013-11-04 | Disposition: A | Discharge status: Home / Self Care | Payer: Medicare Other | Source: Ambulatory Visit | Attending: Surgery | Admitting: Surgery

## 2013-11-04 ENCOUNTER — Ambulatory Visit (INDEPENDENT_AMBULATORY_CARE_PROVIDER_SITE_OTHER): Payer: Self-pay | Admitting: Surgery

## 2013-11-04 VITALS — BP 127/71 | HR 80 | Resp 19 | Ht 68.0 in | Wt 217.0 lb

## 2013-11-04 DIAGNOSIS — I2510 Atherosclerotic heart disease of native coronary artery without angina pectoris: Secondary | ICD-10-CM

## 2013-11-04 DIAGNOSIS — Z951 Presence of aortocoronary bypass graft: Secondary | ICD-10-CM | POA: Insufficient documentation

## 2013-11-04 MED ORDER — FUROSEMIDE 20 MG PO TABS: ORAL_TABLET | ORAL | Status: DC

## 2013-11-04 NOTE — Assessment & Plan Note (Signed)
He was telling all about his dietary modifications. He is really cut back on his animal proteins. Much lower portions, supplemented with increased vegetables and fruits.  He is gradually picking up his exercise level. An anxiously awaiting cardiac rehabilitation for further guidance. He is quite proud of 5 pounds that he has lost since discharge.

## 2013-11-04 NOTE — Assessment & Plan Note (Addendum)
Fortuitous finding of multivessel CAD do to A. fib-related angina. He was referred for urgent CABG, from which he is recovering. He did have some exertional class II dyspnea and discomfort prior to his A. fib events.  He notes now he feels better than he had before. He is slowly pushing his activity level, has not had any recurrence of any anginal symptoms. He is on low-dose beta blocker and statin. No aspirin or Plavix do to the need for long-term anticoagulation for A. fib.  Plan: Increase metoprolol 25 mg twice a day, DC diltiazem.  Would check echocardiogram after next visit to get baseline EF postoperative evaluation.  Check lipid panel after next visit (along with LFTs and TFTs)

## 2013-11-04 NOTE — Progress Notes (Signed)
HPI:  Patient returns for routine postoperative follow-up having undergone coronary artery bypass graft surgery x4 on 10/10/2013. The patient's early postoperative recovery while in the hospital was notable for atrial flutter/atrial fibrillation with a rapid ventricular response that was difficult to control. He was controlled with amiodarone, cardizem, digoxin and and lopressor. Since hospital discharge the patient reports that he has been feeling well and is ambulating without chest pain or dyspnea. He has noted no tachypalpitations.   Current Outpatient Prescriptions  Medication Sig Dispense Refill  . amiodarone (PACERONE) 400 MG tablet Take 400 mg by mouth daily.      Marland Kitchen apixaban (ELIQUIS) 5 MG TABS tablet Take 1 tablet (5 mg total) by mouth 2 (two) times daily.  60 tablet  11  . atorvastatin (LIPITOR) 40 MG tablet Take 1 tablet (40 mg total) by mouth daily at 6 PM.  30 tablet  1  . digoxin (LANOXIN) 0.125 MG tablet Take 1 tablet (0.125 mg total) by mouth daily.  30 tablet  11  . diphenhydrAMINE (BENADRYL) 25 MG tablet Take 25 mg by mouth every 6 (six) hours as needed for sleep.      . furosemide (LASIX) 20 MG tablet Take 1 tab daily for 1 week, then as needed for swelling  30 tablet  12  . metoprolol tartrate (LOPRESSOR) 25 MG tablet Take 1 tablet (25 mg total) by mouth 2 (two) times daily.  60 tablet  11   No current facility-administered medications for this visit.    Physical Exam BP 127/71  Pulse 80  Resp 19  Ht 5\' 8"  (1.727 m)  Wt 217 lb (98.431 kg)  BMI 33.00 kg/m2  SpO2 98% He looks well. Cardiac exam shows a regular rate and rhythm with normal heart sounds. Lung exam is clear. The chest incision is healing well and sternum is stable. The leg incisions are healing well. There is a small subcutaneous hematoma or seroma beneath the right leg incision. There is mild erythema around the left lower leg incision. There is mild edema in the left lower leg.  Diagnostic  Tests:  CLINICAL DATA:  Cardiac surgery   EXAM: CHEST  2 VIEW   COMPARISON:  10/12/2013   FINDINGS: The heart is moderately enlarged. Pulmonary vascularity is within normal limits. There is increased AP diameter of the chest likely related to COPD. Postoperative changes from sternotomy and coronary artery bypass are noted.   Pleural parenchymal changes at the left base have improved and there  is only a mild residual left pleural effusion. No pneumothorax. Degenerative changes in the thoracic spine are stable compared with 10/09/2013.   IMPRESSION: Further improvement. Small residual left pleural effusion. Otherwise, no evidence of active cardiopulmonary disease.     Electronically Signed   By: Maryclare Bean M.D.   On: 11/04/2013 14:36     Impression:  Overall I think he is making good progress following his surgery. I encouraged him to continue ambulating as much as possible. He is planning on participating in cardiac rehabilitation. He has mild erythema around the left lower leg incision and I started him on Keflex 500 mg 3 times a day x7 days for early cellulitis. 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 I told him he could return to working 4 hours per day as of 6 weeks postoperatively but would need to wait 3 months postoperatively to work full time. I told him he could return to driving his own  car short distances at this time. I ask him not to lift anything heavier than 10 pounds for 3 months postoperatively.  Plan:  He will continue followup with cardiology. He will return to see me if the erythema around his leg incision does not completely resolve.

## 2013-11-04 NOTE — Assessment & Plan Note (Addendum)
Due to see Dr. Laneta Simmers soon. 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  Would defer this decision to Dr. Laneta Simmers. He still has mild postop edema from volume overload. -->   Will order Lasix 20 mg daily x1 week. After that, he he will use as PRN.  He has been referred for Phase 2/Outpatient Cardiac Rehabilitation.

## 2013-11-04 NOTE — Assessment & Plan Note (Signed)
Quite symptomatic A. fib. Simply because how difficult he was managed he was put back on amiodarone for which he continues to taper. He is also on a beta blocker, calcium channel blocker and digoxin. No recurrence since discharge. However simply based on how symptomatic he was, I intend to keep him controlled with amiodarone.  With that in mind we'll need to check baseline PFTs and other labs after his next visit.  Plan:   DC diltiazem, increase metoprolol 25 twice a day  Continue digoxin  Amiodarone load as follows: Continue 4 mg twice a day 3 and this week --> then decrease to 400 mg daily x1 month, --> then reduce to maintenance dose of 200 mg daily.  Will order PFTs, LFTs and thyroid function tests at next visit

## 2013-11-04 NOTE — Assessment & Plan Note (Signed)
He clearly had a reason for having angina, and essentially failed a stress test with rapid A. Fib. No recurrence of anginal symptoms since his discharge from CABG.  Will order when necessary nitroglycerin at return visit.

## 2013-12-20 ENCOUNTER — Other Ambulatory Visit: Payer: Self-pay | Admitting: Physician Assistant

## 2013-12-22 ENCOUNTER — Other Ambulatory Visit: Payer: Self-pay | Admitting: *Deleted

## 2013-12-22 MED ORDER — ATORVASTATIN CALCIUM 40 MG PO TABS: 40.0000 mg | ORAL_TABLET | Freq: Every day | ORAL | Status: DC

## 2013-12-22 NOTE — Telephone Encounter (Signed)
Rx was sent to pharmacy electronically. 

## 2014-01-20 ENCOUNTER — Ambulatory Visit (HOSPITAL_COMMUNITY)
Admission: RE | Admit: 2014-01-20 | Discharge: 2014-01-20 | Disposition: A | Discharge status: Home / Self Care | Payer: Medicare Other | Source: Ambulatory Visit | Attending: Cardiology | Admitting: Cardiology

## 2014-01-20 ENCOUNTER — Ambulatory Visit (INDEPENDENT_AMBULATORY_CARE_PROVIDER_SITE_OTHER): Payer: Medicare Other | Admitting: Cardiology

## 2014-01-20 ENCOUNTER — Encounter: Payer: Self-pay | Admitting: Cardiology

## 2014-01-20 VITALS — BP 120/60 | HR 80 | Ht 68.0 in | Wt 222.0 lb

## 2014-01-20 DIAGNOSIS — I480 Paroxysmal atrial fibrillation: Secondary | ICD-10-CM

## 2014-01-20 DIAGNOSIS — L0390 Cellulitis, unspecified: Secondary | ICD-10-CM

## 2014-01-20 DIAGNOSIS — I4891 Unspecified atrial fibrillation: Secondary | ICD-10-CM

## 2014-01-20 DIAGNOSIS — R5383 Other fatigue: Secondary | ICD-10-CM

## 2014-01-20 DIAGNOSIS — Z7901 Long term (current) use of anticoagulants: Secondary | ICD-10-CM | POA: Insufficient documentation

## 2014-01-20 DIAGNOSIS — R600 Localized edema: Secondary | ICD-10-CM

## 2014-01-20 DIAGNOSIS — R609 Edema, unspecified: Secondary | ICD-10-CM

## 2014-01-20 DIAGNOSIS — Z951 Presence of aortocoronary bypass graft: Secondary | ICD-10-CM

## 2014-01-20 DIAGNOSIS — L0291 Cutaneous abscess, unspecified: Secondary | ICD-10-CM

## 2014-01-20 DIAGNOSIS — M7989 Other specified soft tissue disorders: Secondary | ICD-10-CM | POA: Insufficient documentation

## 2014-01-20 DIAGNOSIS — R5381 Other malaise: Secondary | ICD-10-CM

## 2014-01-20 LAB — CBC WITH DIFFERENTIAL/PLATELET
BASOS PCT: 0 % (ref 0–1)
Basophils Absolute: 0 10*3/uL (ref 0.0–0.1)
EOS PCT: 1 % (ref 0–5)
Eosinophils Absolute: 0.1 10*3/uL (ref 0.0–0.7)
HEMATOCRIT: 48 % (ref 39.0–52.0)
Hemoglobin: 16.5 g/dL (ref 13.0–17.0)
LYMPHS PCT: 15 % (ref 12–46)
Lymphs Abs: 1.6 10*3/uL (ref 0.7–4.0)
MCH: 31.9 pg (ref 26.0–34.0)
MCHC: 34.4 g/dL (ref 30.0–36.0)
MCV: 92.8 fL (ref 78.0–100.0)
Monocytes Absolute: 1.4 10*3/uL — ABNORMAL HIGH (ref 0.1–1.0)
Monocytes Relative: 13 % — ABNORMAL HIGH (ref 3–12)
Neutro Abs: 7.4 10*3/uL (ref 1.7–7.7)
Neutrophils Relative %: 71 % (ref 43–77)
Platelets: 220 10*3/uL (ref 150–400)
RBC: 5.17 MIL/uL (ref 4.22–5.81)
RDW: 14.1 % (ref 11.5–15.5)
WBC: 10.5 10*3/uL (ref 4.0–10.5)

## 2014-01-20 MED ORDER — POTASSIUM CHLORIDE ER 10 MEQ PO TBCR: 10.0000 meq | EXTENDED_RELEASE_TABLET | Freq: Every day | ORAL | Status: DC

## 2014-01-20 MED ORDER — CEPHALEXIN 500 MG PO CAPS: 500.0000 mg | ORAL_CAPSULE | Freq: Three times a day (TID) | ORAL | Status: DC

## 2014-01-20 MED ORDER — METOPROLOL TARTRATE 25 MG PO TABS: 37.5000 mg | ORAL_TABLET | Freq: Two times a day (BID) | ORAL | Status: DC

## 2014-01-20 MED ORDER — FUROSEMIDE 40 MG PO TABS: 40.0000 mg | ORAL_TABLET | Freq: Every day | ORAL | Status: DC

## 2014-01-20 NOTE — Progress Notes (Signed)
Left Lower Extremity Venous Duplex Completed. °Brianna L Mazza,RVT °

## 2014-01-20 NOTE — Assessment & Plan Note (Addendum)
EKG with  Atrial fib.  I reviewed with Dr. Tresa EndoKelly.  We increased his lopressor to 37.5 mg BID, at end of visit he told me he was supposed to have decreased his amiodarone to 200 mg 4-5 weeks ago, but today was the day he decreased to 200 mg.  Dr. Tresa EndoKelly felt we should leave at 400 mg.  When we see the pt next week we will further eval the rhythm.  I have ordered TSH, CMP, CBC  To further eval. Pt did have a fib post op with RVR, but was in SR on last visit.

## 2014-01-20 NOTE — Assessment & Plan Note (Addendum)
Pt with Bil  lower ext edema though Lt much greater than Rt.  With redness and blisters and drainage of serous fluid. Pt on eliquis and this is leg for VG harvesting but we did check venous doppler and it was neg for DVT.  I have added Lasix total of 60 mg today and then 40 mg daily.  I also added 10 meq of K+.. I will see him back next week to recheck.

## 2014-01-20 NOTE — Assessment & Plan Note (Signed)
Pt on eliquis without any complaints of bleeding

## 2014-01-20 NOTE — Assessment & Plan Note (Signed)
Added Keflex 500 mg TID X 7 days

## 2014-01-20 NOTE — Assessment & Plan Note (Signed)
Pt underwent CABG in Oct. No chest pain and no SOB.

## 2014-01-20 NOTE — Patient Instructions (Signed)
Increase Lasix to 60mg  today then 40 mg daily.  Have lab work done today  I increased your Metoprolol to 37.5 mg twice a day.    Follow up next week with PA/NP just to make sure your leg is better  Follow up with Dr. Herbie BaltimoreHarding in 1 month

## 2014-01-20 NOTE — Progress Notes (Signed)
01/20/2014   PCP: No PCP Per Patient   Chief Complaint  Patient presents with  . Follow-up    reddness ,drainage and swelling lt. leg ; some swelling bilat. ; pt. had vein stripped out of left leg Oct/18/2014    Primary Cardiologist: Dr. Herbie BaltimoreHarding  HPI:  68 y.o. male with a PMH below who presents today forlower ext edema. He initially presented with recurrent episodes of chest discomfort, weakness and rapid heart beats. His abdomen A. fib RVR that spontaneously converted with beta blocker. This led to resolution of his pain. However it is simply because of the severity of his chest discomfort symptoms is not quite consistent with angina, he was taken to the cardiac catheterization lab and revealed high-grade RCA disease coupled with distal left main near occlusion. He was referred for urgent CABGx4 which was actually done on Saturday morning by Dr. Laneta SimmersBartle. His postop course was complicated by a mild ileus, more notably with recurrent atrial fibrillation. He was loaded on amiodarone and actually treated with overdrive pacing via the epicardial pacer wires which was only partially successful as he had recurrence of A. fib. He was quite symptomatic during his episodes with diaphoresis and discomfort in his chest as well as dyspnea. He did not however have the anginal type symptoms he was having before his CABG. He was discharged on Eliquis for anticoagulation based on his high CHADS2VASC2 Score of 3.  Over last several days he has increased edema in both leg but mostly in the left with now cellulitis and blisters along with serous drainage.  He has some cellulitis post op which appeared to clear.  He is difficult to obtain history from.  He denies SOB or chest pain.  He denies any tachycardia. No fevers.  At end of visit he stated he was supposed to have decreased amiodarone to 200 mg daily several weeks ago but he just did that today.    No Known Allergies  Current Outpatient  Prescriptions  Medication Sig Dispense Refill  . amiodarone (PACERONE) 400 MG tablet Take 400 mg by mouth daily.      Marland Kitchen. apixaban (ELIQUIS) 5 MG TABS tablet Take 1 tablet (5 mg total) by mouth 2 (two) times daily.  60 tablet  11  . atorvastatin (LIPITOR) 40 MG tablet Take 1 tablet (40 mg total) by mouth daily at 6 PM.  30 tablet  10  . digoxin (LANOXIN) 0.125 MG tablet Take 1 tablet (0.125 mg total) by mouth daily.  30 tablet  11  . diphenhydrAMINE (BENADRYL) 25 MG tablet Take 25 mg by mouth every 6 (six) hours as needed for sleep.      . cephALEXin (KEFLEX) 500 MG capsule Take 1 capsule (500 mg total) by mouth 3 (three) times daily.  21 capsule  0  . furosemide (LASIX) 40 MG tablet Take 1 tablet (40 mg total) by mouth daily. And prn  90 tablet  3  . metoprolol tartrate (LOPRESSOR) 25 MG tablet Take 1.5 tablets (37.5 mg total) by mouth 2 (two) times daily.  90 tablet  6  . potassium chloride (K-DUR) 10 MEQ tablet Take 1 tablet (10 mEq total) by mouth daily.  30 tablet  6   No current facility-administered medications for this visit.    Past Medical History  Diagnosis Date  . Back pain, chronic   . Obesity (BMI 30-39.9)   . S/P CABG x 4 10/10/2013    (LIMA to  LAD, SVG to OM, SVG to RAMUS INTERMEDIATE, and SVG to RCA  . Unstable angina 10/09/2013  . CAD (coronary artery disease), native coronary artery 10/09/2013  . Paroxysmal a-fib 10/09/2013    Admitted with Afib RVR, 2 episodes post-op.    Past Surgical History  Procedure Laterality Date  . Hernia repair    . Cardiac catheterization  10/09/2013    Distal Left Main 80-90%; ostial/proximal LAD 50-80%, proximal RI 80%, proximal RCA napkin ring 80-90%; relatively normal Circumflex/Lateral OM --> referred for CABG  . Coronary artery bypass graft N/A 10/10/2013    Procedure: CORONARY ARTERY BYPASS GRAFTING (CABG);  Surgeon: Alleen Borne, MD;  Location: [facility] OR;  Service: Open Heart Surgery;  Laterality: N/A;  Times 4 using left internal  mammary artery and endoscopically harvested left saphenous vein  . Intraoperative transesophageal echocardiogram N/A 10/10/2013    Procedure: INTRAOPERATIVE TRANSESOPHAGEAL ECHOCARDIOGRAM;  Surgeon: Alleen Borne, MD;  Location: [facility] OR;  Service: Open Heart Surgery;  Laterality: N/A;  . Dupuytren / palmar fasciotomy Right     ZOX:WRUEAVW:UJ colds or fevers, no weight changes Skin:no rashes or ulcers, swelling of lt lower ext, with oozing, for several days. HEENT:no blurred vision, no congestion CV:see HPI, no chest pain PUL:see HPI, no SOB GI:no diarrhea constipation or melena, no indigestion GU:no hematuria, no dysuria MS:no joint pain, no claudication Neuro:no syncope, no lightheadedness Endo:no diabetes, no thyroid disease  PHYSICAL EXAM BP 120/60  Pulse 80  Ht 5\' 8"  (1.727 m)  Wt 222 lb (100.699 kg)  BMI 33.76 kg/m2 General:Pleasant affect, NAD Skin:Warm and dry, brisk capillary refill, see below HEENT:normocephalic, sclera clear, mucus membranes moist Neck:supple, no JVD, no bruits  Heart:irreg irreg without murmur, gallup, rub or click Lungs:clear without rales, rhonchi, or wheezes WJX:BJYN, non tender, + BS, do not palpate liver spleen or masses Ext:1+ Rt lower ext edema,2-3+ Lt lower ext edema  And tight leg to knee with oozing of serous fluid, 2 small blisters and scabbed areas, with 2+ pedal pulses, 2+ radial pulses Neuro:alert and oriented, MAE, follows commands, + facial symmetry  EKG:A fib with rate control   ASSESSMENT AND PLAN Leg edema, left Pt with Bil  lower ext edema though Lt much greater than Rt.  With redness and blisters and drainage of serous fluid. Pt on eliquis and this is leg for VG harvesting but we did check venous doppler and it was neg for DVT.  I have added Lasix total of 60 mg today and then 40 mg daily.  I also added 10 meq of K+.. I will see him back next week to recheck.  Cellulitis, Lt leg Added Keflex 500 mg TID X 7 days  PAF (paroxysmal  atrial fibrillation), currently in Atrial fib with rate control. EKG with  Atrial fib.  I reviewed with Dr. Tresa Endo.  We increased his lopressor to 37.5 mg BID, at end of visit he told me he was supposed to have decreased his amiodarone to 200 mg 4-5 weeks ago, but today was the day he decreased to 200 mg.  Dr. Tresa Endo felt we should leave at 400 mg.  When we see the pt next week we will further eval the rhythm.  I have ordered TSH, CMP, CBC  To further eval. Pt did have a fib post op with RVR, but was in SR on last visit.  S/P CABG x 4 Pt underwent CABG in Oct. No chest pain and no SOB.  Chronic anticoagulation, for PAF with eliquis Pt  on eliquis without any complaints of bleeding

## 2014-01-21 ENCOUNTER — Encounter: Payer: Self-pay | Admitting: Cardiology

## 2014-01-21 LAB — COMPREHENSIVE METABOLIC PANEL
ALT: 39 U/L (ref 0–53)
AST: 26 U/L (ref 0–37)
Albumin: 4.7 g/dL (ref 3.5–5.2)
Alkaline Phosphatase: 59 U/L (ref 39–117)
BILIRUBIN TOTAL: 1 mg/dL (ref 0.2–1.2)
BUN: 15 mg/dL (ref 6–23)
CO2: 29 mEq/L (ref 19–32)
Calcium: 9.6 mg/dL (ref 8.4–10.5)
Chloride: 103 mEq/L (ref 96–112)
Creat: 1.24 mg/dL (ref 0.50–1.35)
Glucose, Bld: 86 mg/dL (ref 70–99)
Potassium: 4 mEq/L (ref 3.5–5.3)
SODIUM: 141 meq/L (ref 135–145)
Total Protein: 7.1 g/dL (ref 6.0–8.3)

## 2014-01-21 LAB — TSH: TSH: 3.401 u[IU]/mL (ref 0.350–4.500)

## 2014-01-28 ENCOUNTER — Ambulatory Visit (INDEPENDENT_AMBULATORY_CARE_PROVIDER_SITE_OTHER): Payer: Medicare Other | Admitting: Cardiology

## 2014-01-28 ENCOUNTER — Ambulatory Visit: Payer: Medicare Other | Admitting: Cardiology

## 2014-01-28 ENCOUNTER — Encounter: Payer: Self-pay | Admitting: Cardiology

## 2014-01-28 VITALS — BP 130/82 | HR 80 | Ht 68.0 in | Wt 223.0 lb

## 2014-01-28 DIAGNOSIS — I4891 Unspecified atrial fibrillation: Secondary | ICD-10-CM

## 2014-01-28 DIAGNOSIS — L0291 Cutaneous abscess, unspecified: Secondary | ICD-10-CM

## 2014-01-28 DIAGNOSIS — I480 Paroxysmal atrial fibrillation: Secondary | ICD-10-CM

## 2014-01-28 DIAGNOSIS — R600 Localized edema: Secondary | ICD-10-CM

## 2014-01-28 DIAGNOSIS — L0390 Cellulitis, unspecified: Secondary | ICD-10-CM

## 2014-01-28 DIAGNOSIS — R609 Edema, unspecified: Secondary | ICD-10-CM

## 2014-01-28 MED ORDER — CEPHALEXIN 500 MG PO CAPS: 500.0000 mg | ORAL_CAPSULE | Freq: Three times a day (TID) | ORAL | Status: DC

## 2014-01-28 NOTE — Patient Instructions (Signed)
Continue Amiodarone at 400 mg daily until seen by Dr. Herbie BaltimoreHarding on 2/17 Continue with 40 mg of Lasix daily Restart Keflex 3 times a day for 7 days Continue all other medicines Return for follow-up with Dr. Herbie BaltimoreHarding on 2/17

## 2014-01-28 NOTE — Progress Notes (Signed)
Patient ID: Alfred Thompson, male   DOB: 04/09/1946, 68 y.o.   MRN: [id number]  01/28/2014 Alfred GamblerRonald C Cloe   12/01/1946  [id number]  Primary Physicia No PCP Per Patient Primary Cardiologist: Dr. Herbie BaltimoreHarding  HPI:  The patient is a 68 y/o male, who returns to clinic for follow-up after seeing Nada BoozerLaura Ingold, NP, 1 week ago on 01/20/14 for evaluation of bilateral LEE, L>Rt. Lower extremity dopplers were negative for DVT. He was noted to have erythema with blisters and drainage of serous fluid on the left. It was felt that this was cellulitis. He was treated with a 7 day course of Keflex.   He was also noted to be in atrial fibrillation with a HR in the 90s. He has a history of coronary artery bypass graft surgery x4 on 10/10/2013. His early postoperative recovery while in the hospital was notable for atrial flutter/atrial fibrillation with a rapid ventricular response that was difficult to control. He was controlled with amiodarone, cardizem, digoxi and lopressor. It was decided at his last office visit to continue him on 400 mg of amiodarone daily, to increase his Lopressor to 37.5 mg BID and to reassess for possible DCCV if this persists. He continues on Eliquis for stroke prophylaxis. TSH and well as a CMP were checked on 1/28. He has normal thyroid and hepatic function. He has been asymptomatic with his atrial fibrillation.  He reports that the swelling, appearance and amount of blisters on his leg have improved significantly. He denies any pain.     Current Outpatient Prescriptions  Medication Sig Dispense Refill  . amiodarone (PACERONE) 400 MG tablet Take 400 mg by mouth daily.      Marland Kitchen. apixaban (ELIQUIS) 5 MG TABS tablet Take 1 tablet (5 mg total) by mouth 2 (two) times daily.  60 tablet  11  . atorvastatin (LIPITOR) 40 MG tablet Take 1 tablet (40 mg total) by mouth daily at 6 PM.  30 tablet  10  . digoxin (LANOXIN) 0.125 MG tablet Take 1 tablet (0.125 mg total) by mouth daily.  30 tablet  11  .  diphenhydrAMINE (BENADRYL) 25 MG tablet Take 25 mg by mouth every 6 (six) hours as needed for sleep.      . furosemide (LASIX) 40 MG tablet Take 1 tablet (40 mg total) by mouth daily. And prn  90 tablet  3  . metoprolol tartrate (LOPRESSOR) 25 MG tablet Take 1.5 tablets (37.5 mg total) by mouth 2 (two) times daily.  90 tablet  6  . potassium chloride (K-DUR) 10 MEQ tablet Take 1 tablet (10 mEq total) by mouth daily.  30 tablet  6  . cephALEXin (KEFLEX) 500 MG capsule Take 1 capsule (500 mg total) by mouth 3 (three) times daily.  21 capsule  0   No current facility-administered medications for this visit.    No Known Allergies  History   Social History  . Marital Status: Single    Spouse Name: N/A    Number of Children: N/A  . Years of Education: N/A   Occupational History  . Not on file.   Social History Main Topics  . Smoking status: Never Smoker   . Smokeless tobacco: Never Used  . Alcohol Use: Yes     Comment: occasional  . Drug Use: No  . Sexual Activity: Not on file   Other Topics Concern  . Not on file   Social History Narrative   Married, one sone, no grandchildren. He drives for [employer]     Review of Systems: General: negative for chills, fever, night sweats or weight changes.  Cardiovascular: negative for chest pain, dyspnea on exertion, edema, orthopnea, palpitations, paroxysmal nocturnal dyspnea or shortness of breath Dermatological: negative for rash Respiratory: negative for cough or wheezing Urologic: negative for hematuria Abdominal: negative for nausea, vomiting, diarrhea, bright red blood per rectum, melena, or hematemesis Neurologic: negative for visual changes, syncope, or dizziness All other systems reviewed and are otherwise negative except as noted above.    Blood pressure 130/82, pulse 80, height 5\' 8"  (1.727 m), weight 223 lb (101.152 kg).  General appearance: alert, cooperative and no distress Neck: no carotid bruit and no  JVD Lungs: clear to auscultation bilaterally Heart: irregularly irregular rhythm Extremities: trace bilateral LEE; Lt LE remains slighty erythematous along the anteromedial aspect, there remains one blister with serous drainage Pulses: 2+ and symmetric Skin: warm and dry Neurologic: Grossly normal  EKG Atrial Fibrillation w/ a controlled ventricular response. HR 86 bpm  ASSESSMENT AND PLAN:   Cellulitis, Lt leg Pt and RN both note significant improvement in the overall appearance, however he continues to have mild erythema and 1-2 small blisters with serous fluid drainage over the anteromedial aspect of the left lower extremity. We will repeat course of Keflex, 500 mg TID x 7 days.  Leg edema, left Still with trace bilateral LEE. Continue on 40 mg of Lasix with supplemental potassium daily.  PAF (paroxysmal atrial fibrillation), currently in Atrial fib with rate control. Afib with a controlled ventricular response. Asymptomatic. BP is stable. Continue on 400 mg of Amiodarone daily. TSH and hepatic function were normal when checked on 01/20/14. Continue Lopressor 37.5 mg BID. Continue Eliquis 5 mg BID. Pt will f/u with Dr. Herbie Baltimore on 2/17 to reassess. May consider DCCV if atrial fibrillation persists.     PLAN  Repeat 7 day course of Keflex for cellulitis of LLE. Resume current meds for PAF. Pt will f/u with Dr. Herbie Baltimore in 2 weeks.   SIMMONS, BRITTAINYPA-C 01/28/2014 2:52 PM

## 2014-01-28 NOTE — Assessment & Plan Note (Signed)
Still with trace bilateral LEE. Continue on 40 mg of Lasix with supplemental potassium daily

## 2014-01-28 NOTE — Assessment & Plan Note (Signed)
Pt and RN both note significant improvement in the overall appearance, however he continues to have mild erythema and 1-2 blisters with serous fluid drainage over the anteromedial aspect of the left lower extremity. We will repeat course of Keflex, 500 mg TID x 7 days

## 2014-01-28 NOTE — Assessment & Plan Note (Signed)
Afib with a controlled ventricular response. Asymptomatic. BP is stable. Continue on 400 mg of Amiodarone daily. TSH and hepatic function were normal when checked on 01/20/14. Continue Lopressor 37.5 mg BID. Continue Eliquis 5 mg BID. Pt will f/u with Dr. Herbie BaltimoreHarding on 2/17 to reassess. May consider DCCV if atrial fibrillation persists.

## 2014-02-05 NOTE — Progress Notes (Signed)
Results given at office appointment 01/28/14

## 2014-02-09 ENCOUNTER — Ambulatory Visit: Payer: Medicare Other | Admitting: Cardiology

## 2014-02-21 HISTORY — PX: TRANSTHORACIC ECHOCARDIOGRAM: SHX275

## 2014-02-24 ENCOUNTER — Other Ambulatory Visit: Payer: Self-pay | Admitting: Family Medicine

## 2014-02-24 ENCOUNTER — Other Ambulatory Visit (HOSPITAL_COMMUNITY): Payer: Self-pay | Admitting: Family Medicine

## 2014-02-24 DIAGNOSIS — R609 Edema, unspecified: Secondary | ICD-10-CM

## 2014-02-24 DIAGNOSIS — M79605 Pain in left leg: Secondary | ICD-10-CM

## 2014-02-24 DIAGNOSIS — M7989 Other specified soft tissue disorders: Secondary | ICD-10-CM

## 2014-02-25 ENCOUNTER — Ambulatory Visit (HOSPITAL_COMMUNITY)
Admission: RE | Admit: 2014-02-25 | Discharge: 2014-02-25 | Disposition: A | Discharge status: Home / Self Care | Payer: Medicare Other | Source: Ambulatory Visit | Attending: Family Medicine | Admitting: Family Medicine

## 2014-02-25 DIAGNOSIS — M7989 Other specified soft tissue disorders: Secondary | ICD-10-CM | POA: Insufficient documentation

## 2014-02-25 DIAGNOSIS — M79609 Pain in unspecified limb: Secondary | ICD-10-CM | POA: Insufficient documentation

## 2014-02-25 DIAGNOSIS — M79605 Pain in left leg: Secondary | ICD-10-CM

## 2014-02-25 NOTE — Progress Notes (Signed)
VASCULAR LAB PRELIMINARY  PRELIMINARY  PRELIMINARY  PRELIMINARY  Left lower extremity venous  Duplex completed.    Preliminary report:  Negative for deep and superficial vein thrombosis.  Suhas Estis, RVT 02/25/2014, 5:59 PM

## 2014-03-01 ENCOUNTER — Encounter: Payer: Self-pay | Admitting: Cardiology

## 2014-03-01 ENCOUNTER — Ambulatory Visit (INDEPENDENT_AMBULATORY_CARE_PROVIDER_SITE_OTHER): Payer: Medicare Other | Admitting: Cardiology

## 2014-03-01 VITALS — BP 136/70 | Ht 68.0 in | Wt 219.8 lb

## 2014-03-01 DIAGNOSIS — I872 Venous insufficiency (chronic) (peripheral): Secondary | ICD-10-CM

## 2014-03-01 DIAGNOSIS — Z951 Presence of aortocoronary bypass graft: Secondary | ICD-10-CM

## 2014-03-01 DIAGNOSIS — L0390 Cellulitis, unspecified: Secondary | ICD-10-CM

## 2014-03-01 DIAGNOSIS — I2510 Atherosclerotic heart disease of native coronary artery without angina pectoris: Secondary | ICD-10-CM

## 2014-03-01 DIAGNOSIS — R609 Edema, unspecified: Secondary | ICD-10-CM

## 2014-03-01 DIAGNOSIS — R001 Bradycardia, unspecified: Secondary | ICD-10-CM

## 2014-03-01 DIAGNOSIS — Z79899 Other long term (current) drug therapy: Secondary | ICD-10-CM

## 2014-03-01 DIAGNOSIS — I498 Other specified cardiac arrhythmias: Secondary | ICD-10-CM

## 2014-03-01 DIAGNOSIS — I4891 Unspecified atrial fibrillation: Secondary | ICD-10-CM

## 2014-03-01 DIAGNOSIS — L0291 Cutaneous abscess, unspecified: Secondary | ICD-10-CM

## 2014-03-01 DIAGNOSIS — R600 Localized edema: Secondary | ICD-10-CM

## 2014-03-01 DIAGNOSIS — I480 Paroxysmal atrial fibrillation: Secondary | ICD-10-CM

## 2014-03-01 DIAGNOSIS — I209 Angina pectoris, unspecified: Secondary | ICD-10-CM

## 2014-03-01 DIAGNOSIS — E669 Obesity, unspecified: Secondary | ICD-10-CM

## 2014-03-01 MED ORDER — METOPROLOL TARTRATE 25 MG PO TABS: 25.0000 mg | ORAL_TABLET | Freq: Two times a day (BID) | ORAL | Status: DC

## 2014-03-01 MED ORDER — AMIODARONE HCL 200 MG PO TABS: 200.0000 mg | ORAL_TABLET | Freq: Every day | ORAL | Status: DC

## 2014-03-01 NOTE — Patient Instructions (Addendum)
STOP DIGOXIN  DECREASE AMIOARDONE TO 200 MG  FROM 400 MG  DECREASE METOPROLOL TO 25 MG (1 TABLET TWICE A DAY) FROM  1 AND 1/2 TABLETS TWICE A DAY  Your physician has requested that you have an echocardiogram. Echocardiography is a painless test that uses sound waves to create images of your heart. It provides your doctor with information about the size and shape of your heart and how well your heart's chambers and valves are working. This procedure takes approximately one hour. There are no restrictions for this procedure.  Your physician has recommended that you have a pulmonary function test. Pulmonary Function Tests are a group of tests that measure how well air moves in and out of your lungs. SCHEDULE AT [place]  WEAR COMPRESSION STOCKING FOR LEFT LEG SWELLING.  ( PATIENT WANTED TO WAIT UNTIL HE IS  SEEN IN THE WOUND CLINIC) RX GIVEN TO PATIENT.  Your physician wants you to follow-up in 6 MONTH DR HARDING 30 MINS APPT.  You will receive a reminder letter in the mail two months in advance. If you don't receive a letter, please call our office to schedule the follow-up appointment. Compression Stockings Compression stockings are elastic stockings that "compress" your legs. This helps to increase blood flow, decrease swelling, and reduces the chance of getting blood clots in your lower legs. Compression stockings are used:  After surgery.  If you have a history of poor circulation.  If you are prone to blood clots.  If you have varicose veins.  If you sit or are bedridden for long periods of time. WEARING COMPRESSION STOCKINGS  Your compression stockings should be worn as instructed by your caregiver.  Wearing the correct stocking size is important. Your caregiver can help measure and fit you to the correct size.  When wearing your stockings, do not allow the stockings to bunch up. This is especially important around your toes or behind your knees. Keep the stockings as smooth  as possible.  Do not roll the stockings downward and leave them rolled down. This can form a restrictive band around your legs and can decrease blood flow.  The stockings should be removed once a day for 1 hour or as instructed by your caregiver. When the stockings are taken off, inspect your legs and feet. Look for:  Open sores.  Red spots.  Puffy areas (swelling).  Anything that does not seem normal. IMPORTANT INFORMATION ABOUT COMPRESSION STOCKINGS  The compression stockings should be clean, dry, and in good condition before you put them on.  Do not put lotion on your legs or feet. This makes it harder to put the stockings on.  Change your stockings immediately if they become wet or soiled.  Do not wear stockings that are ripped or torn.  You may hand-wash or put your stockings in the washing machine. Use cold or warm water with mild detergent. Do not bleach your stockings. They may be air-dried or dried in the dryer on low heat.  If you have pain or have a feeling of "pins and needles" in your feet or legs, you may be wearing stockings that are too tight. Call your caregiver right away. SEEK IMMEDIATE MEDICAL CARE IF:   You have numbness or tingling in your lower legs that does not get better quickly after the stockings are removed.  Your toes or feet become cold and blue.  You develop open sores or have red spots on your legs that do not go away. MAKE  SURE YOU:   Understand these instructions.  Will watch your condition.  Will get help right away if you are not doing well or get worse. Document Released: 10/07/2009 Document Revised: 03/03/2012 Document Reviewed: 10/07/2009 [facility] Patient Information 2014 [place]

## 2014-03-03 DIAGNOSIS — Z79899 Other long term (current) drug therapy: Secondary | ICD-10-CM | POA: Insufficient documentation

## 2014-03-03 DIAGNOSIS — R001 Bradycardia, unspecified: Secondary | ICD-10-CM | POA: Insufficient documentation

## 2014-03-03 DIAGNOSIS — R600 Localized edema: Secondary | ICD-10-CM | POA: Insufficient documentation

## 2014-03-03 DIAGNOSIS — I872 Venous insufficiency (chronic) (peripheral): Secondary | ICD-10-CM | POA: Insufficient documentation

## 2014-03-03 NOTE — Assessment & Plan Note (Signed)
Need to continue dietary modification discussed last visit. Unfortunately he back off on doing exercise recently due to to being "lazy. He needs to get back into an exercise regimen..Marland Kitchen

## 2014-03-03 NOTE — Assessment & Plan Note (Addendum)
He had his annual eye exam. He is also due for a six-month followup echocardiogram. We did use that his chemistries and lipid panel and been monitored by her PCP. Plan: Order PFTs and followup TFTs.  He is well beyond the loading dose range;., We can reduce to 200 mg daily.

## 2014-03-03 NOTE — Assessment & Plan Note (Addendum)
He did not tolerate being in atrial fibrillation while in the hospital. He is on amiodarone and digoxin. He was formally on diltiazem, which are reduced due to his low EF. He has had his eye examination on amiodarone. He is due for lung function tests.

## 2014-03-03 NOTE — Assessment & Plan Note (Addendum)
Beside musculoskeletal pains associated with post CABG, he feels like his more energetic and overall better than he was, he is mostly concerned that his leg. No active anginal symptoms currently. No heart failure symptoms. He is currently on beta blocker, statin and low-dose furosemide. He is not on a ACE inhibitor or ARB due to hypotension.  He has not had a formal evaluation of his EF since that time his preop cath. Plan: 2D Echocardiogram to assess structure and function.

## 2014-03-03 NOTE — Assessment & Plan Note (Signed)
No recurrence of angina since his CABG. On the noted above.

## 2014-03-03 NOTE — Assessment & Plan Note (Signed)
Compression stockings. Continue low-dose Lasix. His not always taken a full 40 mg daily.

## 2014-03-03 NOTE — Assessment & Plan Note (Signed)
He had CABG in October, he still has musculoskeletal chest pain but he should go probable feeling like he can do whatever type of activity he wants to do whether the exercise or job related activity.
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
## 2014-03-03 NOTE — Assessment & Plan Note (Addendum)
This is being considered to be intermittent cellulitis, however the symptoms and discoloration and he is noting are more correlating with venous insufficiency. I want to what that means and we'll do about it. We will tentatively schedule him for venous insufficiency ultrasound is of the legs to evaluate for possible endovenous ablation. I will also ask him to wear compression stockings. Unfortunately, Dopplers he had ordered were looking for DVTs. He needs venous insufficiency Doppler to evaluate for this condition.  All hold off on ordering that at this time.

## 2014-03-03 NOTE — Assessment & Plan Note (Signed)
We have already stopped diltiazem and digoxin prior to this. I will back down on the Toprol 12.5 mg twice a day. Currently not symptomatic, provided he is not trying to get up and go rapidly.

## 2014-03-03 NOTE — Assessment & Plan Note (Signed)
He did not tolerate being in atrial fibrillation while in the hospital. He is on amiodarone and digoxin. He was formally on diltiazem, which are reduced due to his low EF. He has had his eye examination on amiodarone. He is due for lung function tests. Anticoagulated with Eliquis.

## 2014-03-04 ENCOUNTER — Ambulatory Visit (HOSPITAL_COMMUNITY)
Admission: RE | Admit: 2014-03-04 | Discharge: 2014-03-04 | Disposition: A | Discharge status: Home / Self Care | Payer: Medicare Other | Source: Ambulatory Visit | Attending: Cardiology | Admitting: Cardiology

## 2014-03-04 DIAGNOSIS — I2510 Atherosclerotic heart disease of native coronary artery without angina pectoris: Secondary | ICD-10-CM | POA: Insufficient documentation

## 2014-03-04 DIAGNOSIS — I517 Cardiomegaly: Secondary | ICD-10-CM

## 2014-03-04 NOTE — Progress Notes (Signed)
  Echocardiogram 2D Echocardiogram has been performed.  Jorje GuildCHUNG, Rifka Ramey 03/04/2014, 3:40 PM

## 2014-03-05 ENCOUNTER — Telehealth: Payer: Self-pay | Admitting: *Deleted

## 2014-03-05 ENCOUNTER — Ambulatory Visit (HOSPITAL_COMMUNITY)
Admission: RE | Admit: 2014-03-05 | Discharge: 2014-03-05 | Disposition: A | Discharge status: Home / Self Care | Payer: Medicare Other | Source: Ambulatory Visit | Attending: Cardiology | Admitting: Cardiology

## 2014-03-05 DIAGNOSIS — I4891 Unspecified atrial fibrillation: Secondary | ICD-10-CM | POA: Insufficient documentation

## 2014-03-05 DIAGNOSIS — Z79899 Other long term (current) drug therapy: Secondary | ICD-10-CM | POA: Insufficient documentation

## 2014-03-05 LAB — PULMONARY FUNCTION TEST
DL/VA % pred: 84 %
DL/VA: 3.8 ml/min/mmHg/L
DLCO UNC: 23.49 ml/min/mmHg
DLCO cor % pred: 79 %
DLCO cor: 23.49 ml/min/mmHg
DLCO unc % pred: 79 %
FEF 25-75 Post: 2.43 L/sec
FEF 25-75 Pre: 2.29 L/sec
FEF2575-%Change-Post: 6 %
FEF2575-%Pred-Post: 97 %
FEF2575-%Pred-Pre: 92 %
FEV1-%CHANGE-POST: 2 %
FEV1-%Pred-Post: 97 %
FEV1-%Pred-Pre: 94 %
FEV1-Post: 3.07 L
FEV1-Pre: 2.99 L
FEV1FVC-%Change-Post: 4 %
FEV1FVC-%Pred-Pre: 99 %
FEV6-%Change-Post: -1 %
FEV6-%Pred-Post: 99 %
FEV6-%Pred-Pre: 100 %
FEV6-POST: 3.99 L
FEV6-PRE: 4.04 L
FEV6FVC-%CHANGE-POST: 0 %
FEV6FVC-%PRED-POST: 104 %
FEV6FVC-%PRED-PRE: 104 %
FVC-%CHANGE-POST: -1 %
FVC-%PRED-PRE: 96 %
FVC-%Pred-Post: 94 %
FVC-Post: 4 L
FVC-Pre: 4.07 L
POST FEV6/FVC RATIO: 100 %
PRE FEV6/FVC RATIO: 99 %
Post FEV1/FVC ratio: 77 %
Pre FEV1/FVC ratio: 73 %
RV % PRED: 84 %
RV: 1.9 L
TLC % pred: 89 %
TLC: 5.94 L

## 2014-03-05 MED ORDER — ALBUTEROL SULFATE (2.5 MG/3ML) 0.083% IN NEBU
2.5000 mg | INHALATION_SOLUTION | Freq: Once | RESPIRATORY_TRACT | Status: AC
  Administered 2014-03-05: 2.5 mg via RESPIRATORY_TRACT

## 2014-03-05 NOTE — Telephone Encounter (Signed)
Spoke to patient.  Echo Result given . Verbalized understanding  

## 2014-03-05 NOTE — Telephone Encounter (Signed)
Message copied by Tobin ChadMARTIN, Loyd Salvador V. on Fri Mar 05, 2014 11:38 AM ------      Message from: Herbie BaltimoreHARDING, DAVID W      Created: Fri Mar 05, 2014  9:27 AM       Echo results:      Good news: Essentially normal echocardiogram and normal pump function and normal valve function.  No signs to suggest heart attack..      EF: 55-60%.      No regional wall motion abnormalities             ------

## 2014-03-05 NOTE — Progress Notes (Signed)
Quick Note:  Echo results: Good news: Essentially normal echocardiogram and normal pump function and normal valve function. No signs to suggest heart attack.. EF: 55-60%. No regional wall motion abnormalities   ______ 

## 2014-03-08 ENCOUNTER — Telehealth: Payer: Self-pay | Admitting: *Deleted

## 2014-03-08 NOTE — Telephone Encounter (Signed)
Message copied by Tobin ChadMARTIN, SHARON V. on Mon Mar 08, 2014 10:42 AM ------      Message from: Marykay LexHARDING, DAVID W      Created: Sat Mar 06, 2014  9:59 PM       Relatively normal -- minimal obstructive disease & minimal loss in DLCO -- good for a baseline reading.            Marykay LexHARDING,DAVID W, MD       ------

## 2014-03-08 NOTE — Telephone Encounter (Signed)
Spoke to patient. PFT Result given . Verbalized understanding  

## 2014-03-11 ENCOUNTER — Encounter (HOSPITAL_BASED_OUTPATIENT_CLINIC_OR_DEPARTMENT_OTHER): Payer: Medicare Other | Attending: Internal Medicine

## 2014-03-11 DIAGNOSIS — L02419 Cutaneous abscess of limb, unspecified: Secondary | ICD-10-CM | POA: Insufficient documentation

## 2014-03-11 DIAGNOSIS — L03119 Cellulitis of unspecified part of limb: Secondary | ICD-10-CM

## 2014-03-11 DIAGNOSIS — E785 Hyperlipidemia, unspecified: Secondary | ICD-10-CM | POA: Insufficient documentation

## 2014-03-11 DIAGNOSIS — I2510 Atherosclerotic heart disease of native coronary artery without angina pectoris: Secondary | ICD-10-CM | POA: Insufficient documentation

## 2014-03-11 DIAGNOSIS — I872 Venous insufficiency (chronic) (peripheral): Secondary | ICD-10-CM | POA: Insufficient documentation

## 2014-03-11 DIAGNOSIS — L97809 Non-pressure chronic ulcer of other part of unspecified lower leg with unspecified severity: Secondary | ICD-10-CM | POA: Insufficient documentation

## 2014-03-11 DIAGNOSIS — Z9889 Other specified postprocedural states: Secondary | ICD-10-CM | POA: Insufficient documentation

## 2014-03-11 DIAGNOSIS — I4891 Unspecified atrial fibrillation: Secondary | ICD-10-CM | POA: Insufficient documentation

## 2014-03-13 NOTE — Progress Notes (Signed)
[facility]  NAME:  Alfred Thompson, Lar[place]                 ACCOUNT NO.:  [id number]  MEDICAL RECORD NO.:  [id number]      DATE OF BIRTH:  08-25-1946  PHYSICIAN:  Ardath SaxPeter Suttyn Cryder, M.D.           VISIT DATE:                                  OFFICE VISIT   [name] is a 68 year old gentleman who last October underwent a open heart bypass and they took a vein from his left leg and since that time, he has had a very swollen leg with episodes of cellulitis.  He has had several venous studies, which did not show any deep venous thrombosis. He has a history of AFib and hyperlipidemia.  His medicines are amiodarone, Eliquis, metoprolol, Lipitor, K-Lor and Lasix.  He takes 40 of Lasix a day.  His vital signs here today showed a blood pressure of 134/68, pulse 43, temperature 98.  He weighs 216 pounds.  On his left leg, he has 2 ulcers, 1 was about a cm in diameter and 1 was cm and a half.  I also noted that he had an edematous leg with cellulitis.  I feel like there is quite a bit of inflammation there and I put him on doxycycline 100 b.i.d.  I put him in an Unna boot after putting silver alginate over the ulcers, and I told him to elevate his leg.  He will come back in a week.  DIAGNOSES: 1. Venous ulcers of left leg with inflammation. 2. Cellulitis of his left leg. 3. Recent open heart surgery with harvesting of saphenous vein on the     left leg. 4. Coronary artery disease with atrial fibrillation.     Ardath SaxPeter Pankaj Haack, M.D.     PP/MEDQ  D:  03/12/2014  T:  03/13/2014  Job:  [id number]

## 2014-03-26 ENCOUNTER — Encounter (HOSPITAL_BASED_OUTPATIENT_CLINIC_OR_DEPARTMENT_OTHER): Payer: Medicare Other | Attending: General Surgery

## 2014-03-26 DIAGNOSIS — I872 Venous insufficiency (chronic) (peripheral): Secondary | ICD-10-CM | POA: Insufficient documentation

## 2014-04-15 ENCOUNTER — Telehealth: Payer: Self-pay | Admitting: *Deleted

## 2014-04-15 NOTE — Telephone Encounter (Signed)
Pt's wife was calling in regards to her husbands colonoscopy that he wants to have done. She needed to know if he would need clearance being that he had bypass surgery in October.  DH

## 2014-04-15 NOTE — Telephone Encounter (Signed)
Doesn't need clearance.    Alfred Lexavid W Harding, MD

## 2014-04-15 NOTE — Telephone Encounter (Signed)
Returned call and pt verified x 2 w/ Jasmine DecemberSharon, pt's wife. Informed per MD and she verbalized understanding.

## 2014-04-15 NOTE — Telephone Encounter (Signed)
Message forwarded to Dr. Harding/Sharon, RN. 

## 2014-08-31 ENCOUNTER — Other Ambulatory Visit: Payer: Self-pay | Admitting: Cardiology

## 2014-08-31 NOTE — Telephone Encounter (Signed)
Rx was sent to pharmacy electronically. 

## 2014-09-23 ENCOUNTER — Ambulatory Visit (INDEPENDENT_AMBULATORY_CARE_PROVIDER_SITE_OTHER): Payer: Medicare Other | Admitting: Cardiology

## 2014-09-23 ENCOUNTER — Encounter: Payer: Self-pay | Admitting: Cardiology

## 2014-09-23 VITALS — BP 104/68 | HR 47 | Ht 68.0 in | Wt 207.0 lb

## 2014-09-23 DIAGNOSIS — I2510 Atherosclerotic heart disease of native coronary artery without angina pectoris: Secondary | ICD-10-CM

## 2014-09-23 DIAGNOSIS — R001 Bradycardia, unspecified: Secondary | ICD-10-CM

## 2014-09-23 DIAGNOSIS — I200 Unstable angina: Secondary | ICD-10-CM

## 2014-09-23 DIAGNOSIS — Z79899 Other long term (current) drug therapy: Secondary | ICD-10-CM

## 2014-09-23 DIAGNOSIS — E785 Hyperlipidemia, unspecified: Secondary | ICD-10-CM

## 2014-09-23 DIAGNOSIS — I480 Paroxysmal atrial fibrillation: Secondary | ICD-10-CM

## 2014-09-23 NOTE — Patient Instructions (Addendum)
Labs - CMP, LIPID- FASTING Stop metoprolol  Your physician wants you to follow-up in 6 MONTHS DR HARDING.  You will receive a reminder letter in the mail two months in advance. If you don't receive a letter, please call our office to schedule the follow-up appointment.

## 2014-09-24 ENCOUNTER — Encounter: Payer: Self-pay | Admitting: Cardiology

## 2014-09-24 DIAGNOSIS — E785 Hyperlipidemia, unspecified: Secondary | ICD-10-CM | POA: Insufficient documentation

## 2014-09-24 NOTE — Progress Notes (Signed)
PCP: Farris Has, MD  Clinic Note: Chief Complaint  Patient presents with  . 6 month visit    no chest pain , no sob , no edema, colonoscopy 8/15    HPI: Alfred Thompson is a 68 y.o. male with a PMH below who presents today for six-month followup of CAD status post CABG.  Past Medical History  Diagnosis Date  . Unstable angina 10/09/2013  . Left main coronary artery disease 10/09/2013    Distal Left Main 80-90%; ostial/proximal LAD 50-80%, proximal RI 80%, proximal RCA napkin ring 80-90%; relatively normal Circumflex/Lateral OM --> referred for CABG; 2-D echo March 2015: EF 55-60% with no regional wall motion on his  . S/P CABG x 4 10/10/2013    (LIMA to LAD, SVG to OM, SVG to RAMUS INTERMEDIATE, and SVG to RCA  . Paroxysmal a-fib 10/09/2013    Admitted with Afib RVR, 2 episodes post-op.  . Obesity (BMI 30-39.9)   . Back pain, chronic     Prior Cardiac Evaluation and Past Surgical History: Past Surgical History  Procedure Laterality Date  . Hernia repair    . Cardiac catheterization  10/09/2013    Distal Left Main 80-90%; ostial/proximal LAD 50-80%, proximal RI 80%, proximal RCA napkin ring 80-90%; relatively normal Circumflex/Lateral OM --> referred for CABG  . Coronary artery bypass graft N/A 10/10/2013    Procedure: CORONARY ARTERY BYPASS GRAFTING (CABG);  Surgeon: Alleen Borne, MD;  Location: [facility] OR;  Service: Open Heart Surgery;  Laterality: N/A;  Times 4 using left internal mammary artery and endoscopically harvested left saphenous vein  . Intraoperative transesophageal echocardiogram N/A 10/10/2013    Procedure: INTRAOPERATIVE TRANSESOPHAGEAL ECHOCARDIOGRAM;  Surgeon: Alleen Borne, MD;  Location: [facility] OR;  Service: Open Heart Surgery;  Laterality: N/A;  . Dupuytren / palmar fasciotomy Right     Interval History: Alfred Thompson is seated on relatively well overall. He really enjoyed cardiac rehabilitation which is now finished. Is not having nearly as many problems with his  Lower extremities (notably his left leg) giving him problems like he had postoperatively. He does vigorously without any symptoms of chest tightness, pressure or dyspnea with rest or exertion. Very infrequent/fleeting palpitations but no sustained rhythm. No PND, orthopnea or edema. No weakness or syncope/near syncope. He has been losing weight with diet and continued exercise. He thinks he may gain back some of the weight that he lost recently.  ROS: A comprehensive Review of Systems - was performed Review of Systems  Constitutional: Positive for weight loss. Negative for malaise/fatigue.  HENT: Negative for nosebleeds.   Eyes: Positive for blurred vision.  Respiratory: Negative.  Negative for cough, hemoptysis, sputum production, shortness of breath and wheezing.   Cardiovascular: Negative.        Per history of present illness  Gastrointestinal: Negative for heartburn, constipation, blood in stool and melena.  Genitourinary: Negative for hematuria.  Musculoskeletal: Positive for back pain. Negative for myalgias.  Neurological: Positive for dizziness. Negative for sensory change, speech change, focal weakness, seizures and loss of consciousness.       Occasional positional dizziness  Endo/Heme/Allergies: Does not bruise/bleed easily.  Psychiatric/Behavioral: Negative for depression. The patient is not nervous/anxious.   All other systems reviewed and are negative.  Current Outpatient Prescriptions on File Prior to Visit  Medication Sig Dispense Refill  . amiodarone (PACERONE) 200 MG tablet Take 1 tablet (200 mg total) by mouth daily.  30 tablet  11  . apixaban (ELIQUIS) 5 MG TABS tablet  Take 1 tablet (5 mg total) by mouth 2 (two) times daily.  60 tablet  11  . atorvastatin (LIPITOR) 40 MG tablet Take 1 tablet (40 mg total) by mouth daily at 6 PM.  30 tablet  10  . furosemide (LASIX) 40 MG tablet Take 1 tablet (40 mg total) by mouth daily. And prn  90 tablet  3  . KLOR-CON M10 10 MEQ  tablet TAKE ONE TABLET BY MOUTH ONCE DAILY  30 tablet  6  . [DISCONTINUED] potassium chloride (K-DUR) 10 MEQ tablet Take 1 tablet (10 mEq total) by mouth daily.  30 tablet  6   No current facility-administered medications on file prior to visit.   ALLERGIES REVIEWED IN EPIC -- No change SOCIAL AND FAMILY HISTORY REVIEWED IN EPIC -- NO change  Wt Readings from Last 3 Encounters:  09/23/14 207 lb (93.895 kg)  03/01/14 219 lb 12.8 oz (99.701 kg)  01/28/14 223 lb (101.152 kg)   PHYSICAL EXAM BP 104/68  Pulse 47  Ht 5\' 8"  (1.727 m)  Wt 207 lb (93.895 kg)  BMI 31.48 kg/m2 General appearance: alert, cooperative, appears stated age, no distress and mildly obese; quite talkative  Neck: no adenopathy, no carotid bruit and no JVD  Heart: RRR, S1, S2 normal, no murmur, click, rub or gallop, nondisplaced PMI.  Lungs: clear to auscultation bilaterally, normal percussion bilaterally and non-labored  Abdomen: soft, non-tender; bowel sounds normal; no masses, no organomegaly; mild obesity  Extremities: No C/C with minimal LLE edema  Pulses: 2+ and symmetric; normal  Neurologic:Alert, oriented, thought content appropriate; grossly normal Cranial nerves: normal (II-XII grossly intact)   Adult ECG Report  Rate: 47 ;  Rhythm: sinus bradycardia and First degree AV block. Mild nonspecific ST-T wave changes.  Narrative Interpretation: No significant change.  Recent Labs:  None available since January    ASSESSMENT / PLAN: CAD - severe at cath - CABG X 4 10/10/13 He says he is probably feeling better than he has in the past few years ever since his CABG. He is able to do just about whatever exercise he wants. No anginal symptoms or heart failure symptoms. Normal EF on followup echo He is bradycardic and therefore we'll stop his beta blocker since he is still on amiodarone. His blood pressure still not high enough to consider an ACE inhibitor or ARB, we will see what his pressures do after stopping  beta blocker. Otherwise he is on a statin  PAF (paroxysmal atrial fibrillation), currently in sinus bradycardia on amiodarone Based on how poorly he did while in A. fib even post CABG, I would prefer to continue with good control using amiodarone. He does not have rate room to continue beta blocker. I will therefore discontinue his beta blocker, especially as his EF is now improved. We will check his LFTs during his routine liver lipid checked for hypercholesterolemia. He had PFTs done last year and we'll recheck them as well as TSH following his next visit. He also has annual eye exams.  Unstable angina No anginal symptoms since his CABG  Bradycardia with 41 - 50 beats per minute Continues to have baseline bradycardia even on 12.5 twice a day metoprolol and amiodarone. I will discontinue beta blocker.  Hyperlipidemia LDL goal <70 On atorvastatin. He be checked. He has eaten today. We'll therefore check in the next few weeks.   Orders Placed This Encounter  Procedures  . Comprehensive metabolic panel    Order Specific Question:  Has the patient fasted?  Answer:  Yes  . Lipid panel    Order Specific Question:  Has the patient fasted?    Answer:  Yes  . EKG 12-Lead   No orders of the defined types were placed in this encounter.    Followup: March 2016   Marykay Lex, M.D., M.S. Interventional Cardiologist   Pager # [phone]

## 2014-09-24 NOTE — Assessment & Plan Note (Signed)
Based on how poorly he did while in A. fib even post CABG, I would prefer to continue with good control using amiodarone. He does not have rate room to continue beta blocker. I will therefore discontinue his beta blocker, especially as his EF is now improved. We will check his LFTs during his routine liver lipid checked for hypercholesterolemia. He had PFTs done last year and we'll recheck them as well as TSH following his next visit. He also has annual eye exams.

## 2014-09-24 NOTE — Assessment & Plan Note (Signed)
Continues to have baseline bradycardia even on 12.5 twice a day metoprolol and amiodarone. I will discontinue beta blocker.

## 2014-09-24 NOTE — Assessment & Plan Note (Addendum)
He says he is probably feeling better than he has in the past few years ever since his CABG. He is able to do just about whatever exercise he wants. No anginal symptoms or heart failure symptoms. Normal EF on followup echo He is bradycardic and therefore we'll stop his beta blocker since he is still on amiodarone. His blood pressure still not high enough to consider an ACE inhibitor or ARB, we will see what his pressures do after stopping beta blocker. Otherwise he is on a statin

## 2014-09-24 NOTE — Assessment & Plan Note (Deleted)
No anginal symptoms since his CABG 

## 2014-09-24 NOTE — Assessment & Plan Note (Signed)
On atorvastatin. He be checked. He has eaten today. We'll therefore check in the next few weeks.

## 2014-09-24 NOTE — Assessment & Plan Note (Signed)
No anginal symptoms since his CABG

## 2014-09-29 ENCOUNTER — Telehealth: Payer: Self-pay | Admitting: *Deleted

## 2014-09-29 LAB — LIPID PANEL
CHOL/HDL RATIO: 2.3 ratio
CHOLESTEROL: 95 mg/dL (ref 0–200)
HDL: 41 mg/dL (ref >39)
LDL Cholesterol: 35 mg/dL (ref 0–99)
Triglycerides: 96 mg/dL (ref <150)
VLDL: 19 mg/dL (ref 0–40)

## 2014-09-29 LAB — COMPREHENSIVE METABOLIC PANEL
ALT: 34 U/L (ref 0–53)
AST: 28 U/L (ref 0–37)
Albumin: 4.8 g/dL (ref 3.5–5.2)
Alkaline Phosphatase: 62 U/L (ref 39–117)
BILIRUBIN TOTAL: 1.8 mg/dL — AB (ref 0.2–1.2)
BUN: 14 mg/dL (ref 6–23)
CHLORIDE: 102 meq/L (ref 96–112)
CO2: 28 meq/L (ref 19–32)
CREATININE: 1.22 mg/dL (ref 0.50–1.35)
Calcium: 9.4 mg/dL (ref 8.4–10.5)
GLUCOSE: 86 mg/dL (ref 70–99)
POTASSIUM: 3.8 meq/L (ref 3.5–5.3)
SODIUM: 140 meq/L (ref 135–145)
Total Protein: 7.4 g/dL (ref 6.0–8.3)

## 2014-09-29 NOTE — Telephone Encounter (Signed)
Message copied by Tobin ChadMARTIN, SHARON V. on Wed Sep 29, 2014  1:54 PM ------      Message from: Marykay LexHARDING, DAVID W      Created: Wed Sep 29, 2014  1:38 PM       Labs look pretty good.        Bilirubin is a bit elevated, otherwise great.      Lipids, chemistries kidney & other liver functions look good.            DH ------

## 2014-09-29 NOTE — Telephone Encounter (Signed)
Left message on patient's answer machine  result released in my chart

## 2014-10-01 ENCOUNTER — Telehealth: Payer: Self-pay | Admitting: Cardiology

## 2014-10-01 MED ORDER — APIXABAN 5 MG PO TABS: 5.0000 mg | ORAL_TABLET | Freq: Two times a day (BID) | ORAL | Status: DC

## 2014-10-01 NOTE — Telephone Encounter (Signed)
SPOKE WITH WIFE. SHE WILL PURCHASE TO COVER THE WEEKEND , AND SHE OR HUSBAND WILL PICK UP SAMPLES OF   ELIQUIS (3 BOXES) AND FREE 30-DAY TRIAL SHE IS AWARE OFFICE WILL TRY TO SUPPLEMENT UNTIL JAN 2016. WIFE WILL FOLLOW UP WITH MEDICARE PART D-COVERAGE

## 2014-10-01 NOTE — Telephone Encounter (Signed)
SPOKE TO WIFE  SHE STATES MEDICATION IS $157  SHE STATES HE DOES NOT ANY TABLETS,AT PRESENT OFFERED SAMPLES  AND FREE 30 DAY CARD. WIFE STATES SHE OR PATIENT IS UNABLE TO PICK UP TODAY THEY [place]IVE OUT OF TOWN AND HE IS SICK TODAY.  WIFE WOULD LIKE TO KNOW OF ANY GENERIC WILL DEFER TO Dr Herbie BaltimoreHarding,

## 2014-10-01 NOTE — Telephone Encounter (Signed)
Mr. Alfred Thompson has been on Eliquis over a year and he went today to pick up his refill and his copay has gone up dramatically.  Can he be placed on something else?

## 2014-10-01 NOTE — Telephone Encounter (Signed)
Only generic option is Warfarin -- would need to do frequent checks.  Can do 2 weeks Eliquis samples, then change to Xarelto (take Dose #1 with evening meal on 1st day off of Eliquis). Can do ~2 weeks of Xarelto samples & if he tolerates it, can doe 1 month of free med card.  This should take us through December.  We can figure out $$ issues for next year & decide what whether we use Xarelto vs. Eliquis going forward.  Also have the other NOAC as an option.  Am forwarding to MovicoKristin as well -- any ideas are welcome.  Marykay LexHARDING,Helana Macbride W, MD

## 2014-11-21 IMAGING — CR DG CHEST 2V
2 series · 2 of 2 positions shown · non-contrast
Comparison: Chest radiograph 10/09/2013

CLINICAL DATA: Preop CABG

EXAM:
CHEST  2 VIEW

[w chest lat]
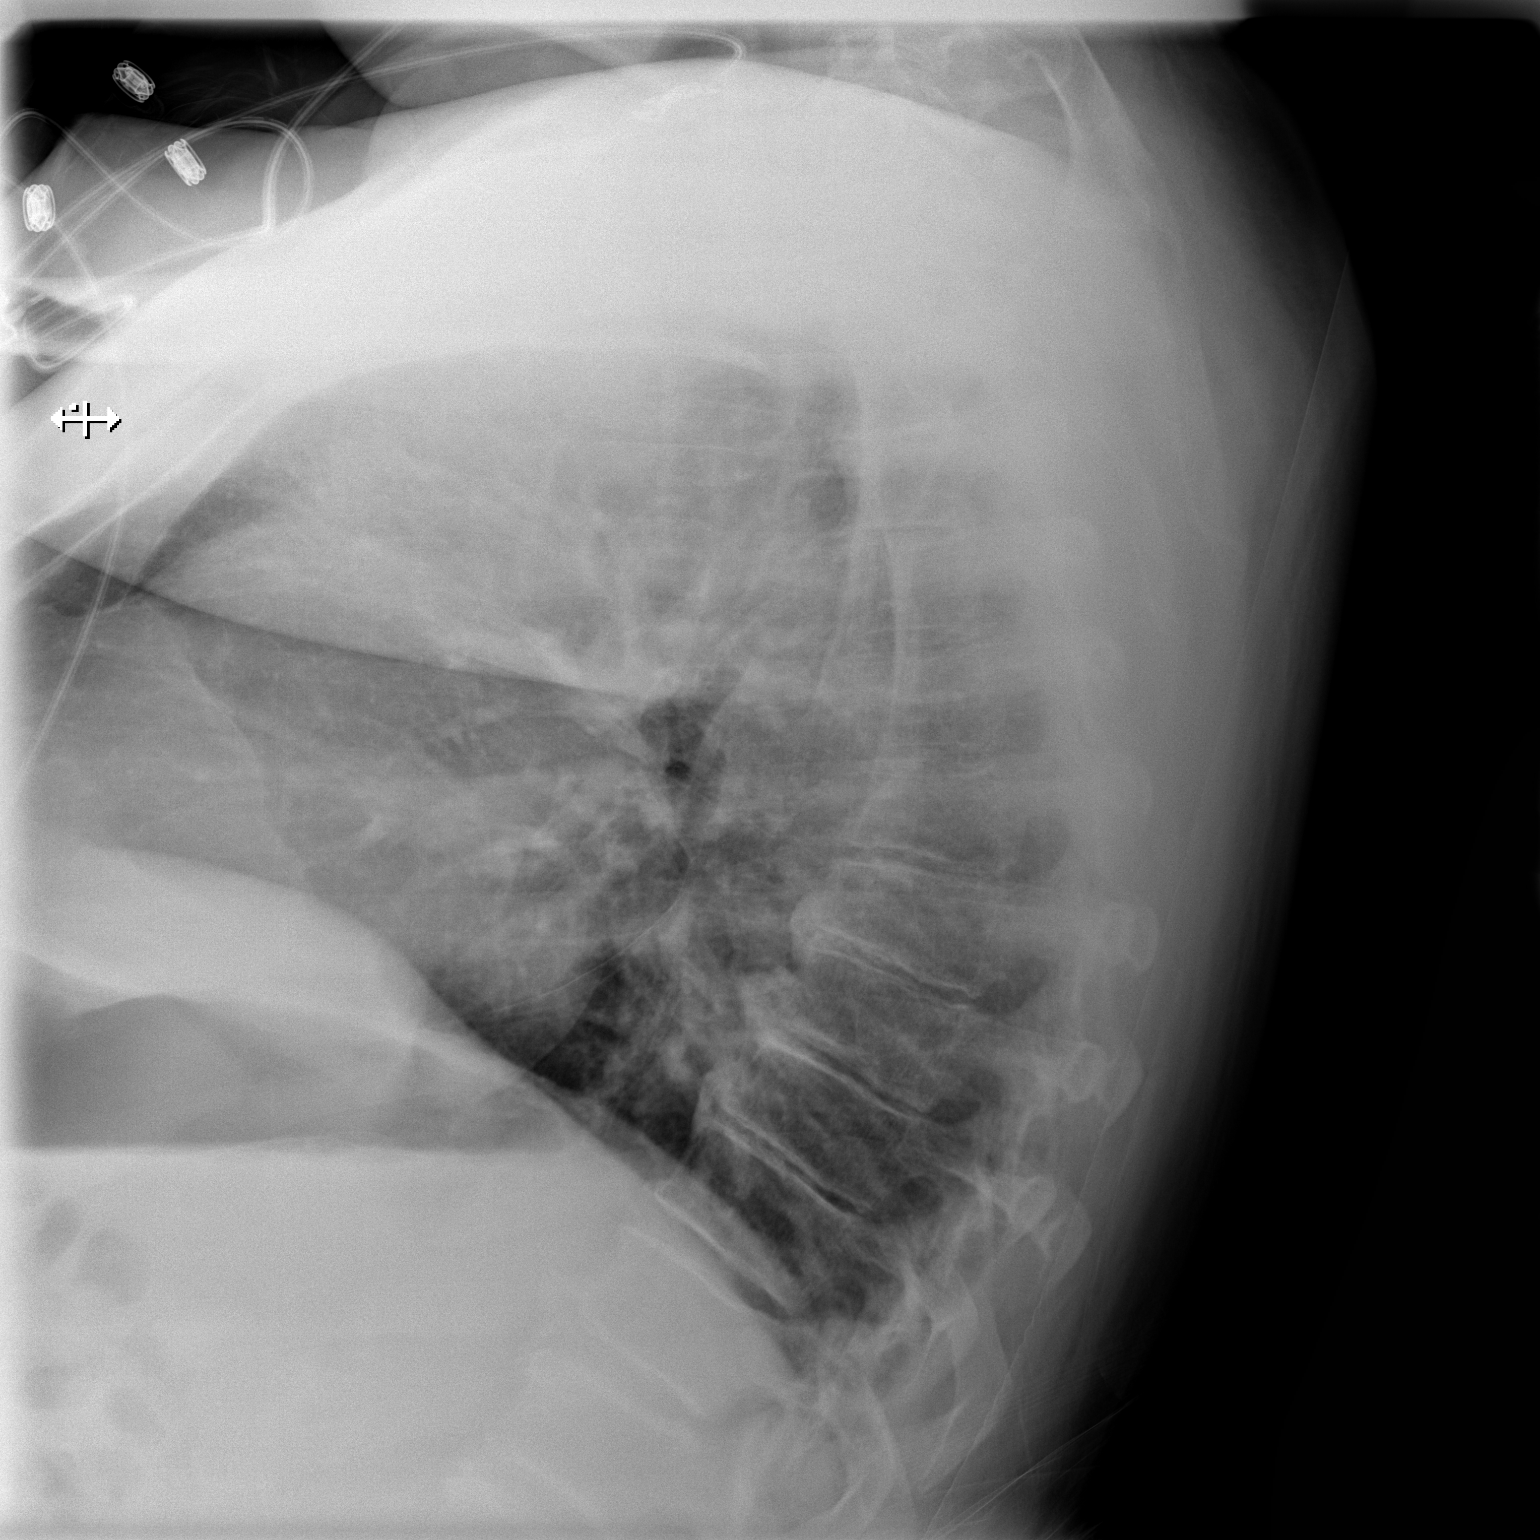

[x chest ap]
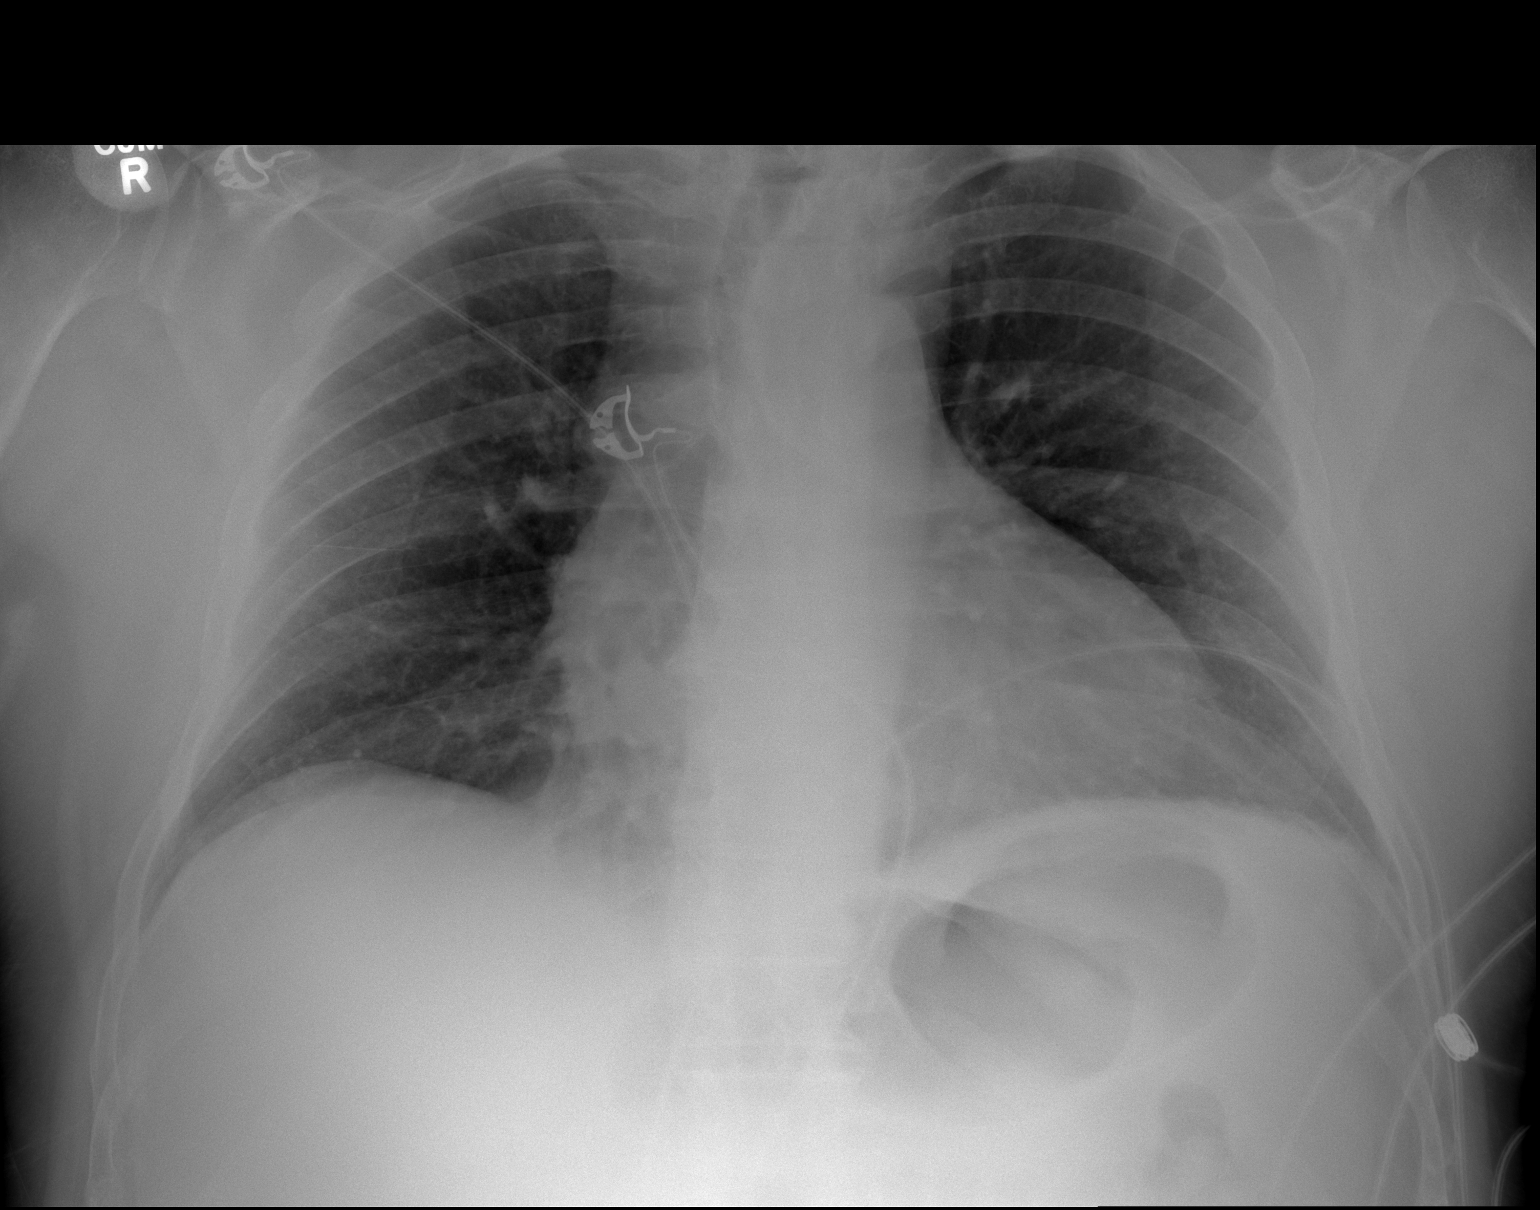

[2 of 2 positions shown; findings below may reference images not displayed]

FINDINGS: Exam is lordotic. Cardiac silhouette is mildly enlarged. No
effusion, infiltrate, or pneumothorax. Degenerative osteophytosis of
the thoracic spine.
IMPRESSION: Mild cardiomegaly without acute findings.

## 2014-12-02 ENCOUNTER — Encounter (HOSPITAL_COMMUNITY): Payer: Self-pay | Admitting: Cardiology

## 2014-12-07 ENCOUNTER — Other Ambulatory Visit: Payer: Self-pay | Admitting: Cardiology

## 2014-12-26 ENCOUNTER — Other Ambulatory Visit: Payer: Self-pay | Admitting: Cardiology

## 2015-02-07 ENCOUNTER — Other Ambulatory Visit: Payer: Self-pay | Admitting: Cardiology

## 2015-02-07 MED ORDER — FUROSEMIDE 40 MG PO TABS: 40.0000 mg | ORAL_TABLET | Freq: Every day | ORAL | Status: DC

## 2015-02-07 NOTE — Telephone Encounter (Signed)
°  1. Which medications need to be refilled? Furosemide  2. Which pharmacy is medication to [facility] [place]  3. Do they need a 30 day or 90 day supply? 90  4. Would they like a call back once the medication has been sent to the pharmacy? no

## 2015-02-07 NOTE — Telephone Encounter (Signed)
Rx(s) sent to pharmacy electronically.  

## 2015-03-07 ENCOUNTER — Other Ambulatory Visit: Payer: Self-pay | Admitting: Cardiology

## 2015-03-07 NOTE — Telephone Encounter (Signed)
Rx has been sent to the pharmacy electronically. ° °

## 2015-03-07 NOTE — Telephone Encounter (Signed)
°  1. Which medications need to be refilled? Amiodarone-Please call today  2. Which pharmacy is medication to be sent to[phone]  3. Do they need a 30 day or 90 day supply? 30    4. Would they like a call back once the medication has been sent to the pharmacy? yes

## 2015-03-14 ENCOUNTER — Ambulatory Visit: Payer: Medicare Other | Admitting: Cardiology

## 2015-04-04 ENCOUNTER — Ambulatory Visit (INDEPENDENT_AMBULATORY_CARE_PROVIDER_SITE_OTHER): Payer: PPO | Admitting: Cardiology

## 2015-04-04 ENCOUNTER — Encounter: Payer: Self-pay | Admitting: Cardiology

## 2015-04-04 VITALS — BP 110/76 | HR 57 | Ht 68.5 in | Wt 208.4 lb

## 2015-04-04 DIAGNOSIS — I872 Venous insufficiency (chronic) (peripheral): Secondary | ICD-10-CM

## 2015-04-04 DIAGNOSIS — R001 Bradycardia, unspecified: Secondary | ICD-10-CM | POA: Diagnosis not present

## 2015-04-04 DIAGNOSIS — I480 Paroxysmal atrial fibrillation: Secondary | ICD-10-CM | POA: Diagnosis not present

## 2015-04-04 DIAGNOSIS — I2510 Atherosclerotic heart disease of native coronary artery without angina pectoris: Secondary | ICD-10-CM

## 2015-04-04 DIAGNOSIS — Z951 Presence of aortocoronary bypass graft: Secondary | ICD-10-CM

## 2015-04-04 DIAGNOSIS — Z79899 Other long term (current) drug therapy: Secondary | ICD-10-CM

## 2015-04-04 DIAGNOSIS — R600 Localized edema: Secondary | ICD-10-CM

## 2015-04-04 DIAGNOSIS — E785 Hyperlipidemia, unspecified: Secondary | ICD-10-CM | POA: Diagnosis not present

## 2015-04-04 DIAGNOSIS — I4891 Unspecified atrial fibrillation: Secondary | ICD-10-CM

## 2015-04-04 DIAGNOSIS — Z7901 Long term (current) use of anticoagulants: Secondary | ICD-10-CM

## 2015-04-04 DIAGNOSIS — I200 Unstable angina: Secondary | ICD-10-CM

## 2015-04-04 DIAGNOSIS — E669 Obesity, unspecified: Secondary | ICD-10-CM

## 2015-04-04 NOTE — Progress Notes (Signed)
PCP: Farris Has, MD  Clinic Note: Chief Complaint  Patient presents with  . Follow-up    6 Months, pt stated he's doing great  . Coronary Artery Disease  . Atrial Fibrillation   HPI: Alfred Thompson is a 69 y.o. male with a PMH below who presents today for 6 months f/u of CAD-CABG & PAF on Amodarone & Eliquis.  Past Medical History  Diagnosis Date  . Unstable angina 10/09/2013  . Left main coronary artery disease 10/09/2013    Distal Left Main 80-90%; ostial/proximal LAD 50-80%, proximal RI 80%, proximal RCA napkin ring 80-90%; relatively normal Circumflex/Lateral OM --> referred for CABG; 2-D echo March 2015: EF 55-60% with no regional wall motion on his  . S/P CABG x 4 10/10/2013    (LIMA to LAD, SVG to OM, SVG to RAMUS INTERMEDIATE, and SVG to RCA  . Paroxysmal a-fib 10/09/2013    Admitted with Afib RVR, 2 episodes post-op.  . Obesity (BMI 30-39.9)   . Back pain, chronic     Prior Cardiac Evaluation and Past Surgical History: Past Surgical History  Procedure Laterality Date  . Hernia repair    . Coronary artery bypass graft N/A 10/10/2013    Procedure: CORONARY ARTERY BYPASS GRAFTING (CABG);  Surgeon: Alleen Borne, MD;  Location: [facility] OR;  Service: Open Heart Surgery;  Laterality: N/A;  Times 4 using left internal mammary artery and endoscopically harvested left saphenous vein  . Intraoperative transesophageal echocardiogram N/A 10/10/2013    Procedure: INTRAOPERATIVE TRANSESOPHAGEAL ECHOCARDIOGRAM;  Surgeon: Alleen Borne, MD;  Location: [facility] OR;  Service: Open Heart Surgery;  Laterality: N/A;  . Dupuytren / palmar fasciotomy Right   . Left heart catheterization with coronary angiogram N/A 10/09/2013    Procedure: LEFT HEART CATHETERIZATION WITH CORONARY ANGIOGRAM;  Surgeon: Marykay Lex, MD;  Location: [facility] CATH LAB;  Distal Left Main 80-90%; ostial/proximal LAD 50-80%, proximal RI 80%, proximal RCA napkin ring 80-90%; relatively normal Circumflex/Lateral OM --> referred  for CABG    Interval History: Zayquan is doing very well from cardiac standpoint. His major complaint now is that he is yet to find a "work out partner " drain with period ever since he finished his maintenance plan on the chronic rehabilitation, he has been searching for someone to workout with. He is a recent plateau with his weight loss but is otherwise doing very well cardiac standpoint.  Cardiovascular ROS: no chest pain or dyspnea on exertion positive for - Mild edema negative for - irregular heartbeat, loss of consciousness, murmur, orthopnea, palpitations, paroxysmal nocturnal dyspnea, rapid heart rate, shortness of breath or TIA/amaurosis fugax, syncope/near syncope; No melena, hematochezia, hematuria or epistaxis on Eliquis.  ROS: A comprehensive was performed. Review of Systems  Constitutional: Negative for weight loss and malaise/fatigue.  HENT: Negative for nosebleeds.   Respiratory: Negative for cough and wheezing.   Cardiovascular: Positive for leg swelling (Stable swelling, well controlled with Lasix and compression stockings.).  Gastrointestinal: Negative for blood in stool and melena.  Genitourinary: Negative for hematuria.  Musculoskeletal: Positive for joint pain. Negative for myalgias.  Neurological: Negative for dizziness and loss of consciousness.  Psychiatric/Behavioral: Negative.   All other systems reviewed and are negative.   Current Outpatient Prescriptions on File Prior to Visit  Medication Sig Dispense Refill  . amiodarone (PACERONE) 200 MG tablet TAKE ONE TABLET BY MOUTH ONCE DAILY 30 tablet 6  . apixaban (ELIQUIS) 5 MG TABS tablet Take 1 tablet (5 mg total) by mouth 2 (  two) times daily. 42 tablet 0  . atorvastatin (LIPITOR) 40 MG tablet TAKE ONE TABLET BY MOUTH ONCE DAILY AT 6PM 30 tablet 6  . ELIQUIS 5 MG TABS tablet TAKE ONE TABLET BY MOUTH TWICE DAILY 60 tablet 5  . furosemide (LASIX) 40 MG tablet Take 1 tablet (40 mg total) by mouth daily. 90 tablet 2    . KLOR-CON M10 10 MEQ tablet TAKE ONE TABLET BY MOUTH ONCE DAILY 30 tablet 6  . [DISCONTINUED] potassium chloride (K-DUR) 10 MEQ tablet Take 1 tablet (10 mEq total) by mouth daily. 30 tablet 6   No current facility-administered medications on file prior to visit.   No Known Allergies  History  Substance Use Topics  . Smoking status: Never Smoker   . Smokeless tobacco: Never Used  . Alcohol Use: Yes     Comment: occasional   Family History  Problem Relation Age of Onset  . Coronary artery disease Father [id number]    CABG  . Rectal cancer Mother   . Transient ischemic attack Mother     Wt Readings from Last 3 Encounters:  04/04/15 208 lb 6.4 oz (94.53 kg)  09/23/14 207 lb (93.895 kg)  03/01/14 219 lb 12.8 oz (99.701 kg)    PHYSICAL EXAM BP 110/76 mmHg  Pulse 57  Ht 5' 8.5" (1.74 m)  Wt 208 lb 6.4 oz (94.53 kg)  BMI 31.22 kg/m2 General appearance: alert, cooperative, appears stated age, no distress and mildly obese; quite talkative  HEENT: [place]/AT, EOMI, MMM, anicteric sclera Neck: no adenopathy, no carotid bruit and no JVD  Heart: RRR, S1, S2 normal, no murmur, click, rub or gallop, nondisplaced PMI.  Lungs: clear to auscultation bilaterally, normal percussion bilaterally and non-labored  Abdomen: soft, non-tender; bowel sounds normal; no masses, no organomegaly; mild obesity  Extremities: No C/C with ~1+ LLE edema  Pulses: 2+ and symmetric; normal  Neurologic:Alert, oriented, thought content appropriate; grossly normal; normal mood & affect Cranial nerves: normal (II-XII grossly intact);   Adult ECG Report  Rate: 57 ;  Rhythm: sinus bradycardia and Borderline first-degree AV block (198), borderline incomplete right bundle branch block. Also old cannot exclude inferior MI age undetermined.  Narrative Interpretation: stable EKG  Recent Labs:    Lab Results  Component Value Date   CHOL 95 09/28/2014   HDL 41 09/28/2014   LDLCALC 35 09/28/2014   TRIG 96 09/28/2014    CHOLHDL 2.3 09/28/2014   Lab Results  Component Value Date   CREATININE 1.22 09/28/2014     ASSESSMENT / PLAN: Overall quite stable from cardiac standpoint. No medication changes. Problem List Items Addressed This Visit    Chronic anticoagulation, for PAF with eliquis (Chronic)    No bleeding complications.      Edema of left lower extremity    Probably multifactorial related to venous insufficiency and status post saphenous vein harvesting for CABG. Stable amount of edema. No sign of cellulitis. Continued to be relatively well-controlled with standing dose of Lasix plus compression hose.  Continue current plan. Elevate leg when possible.       Hyperlipidemia LDL goal <70 (Chronic)   Relevant Orders   EKG 12-Lead   Pulmonary Function Test   Obesity (BMI 30-39.9) (Chronic)    Borderline criteria -- needs to get back to exercising.      On amiodarone therapy (Chronic)    Will need to have labs checked by PCP including TSH and LFTs done during routine evaluation for of lipids. He will also  need PFTs checked prior to next followup to evaluate for possible lung toxicity.  Annual Eye exams.  Plan: defer lab work to PCP who follows lipids. PFTs. Continue current dose.      Relevant Orders   Pulmonary Function Test   PAF (paroxysmal atrial fibrillation), currently in sinus bradycardia on amiodarone (Chronic)    Will continue a regular as opposed to beta blocker based on how intolerant of A. Fib he was postoperatively. Can reassess next year.  Maintaining NSR/S brady as long as he has been on Amiodarone.       Relevant Orders   EKG 12-Lead   Pulmonary Function Test   S/P CABG x 4 (Chronic)    He is now a year and a half post CABG. Would like to assess followup Myoview at roughly the 3 /12 -4 yr range.      Severe multivessel CAD: CABG x4 October 2014 - Primary (Chronic)    His age it was associated with A. Fib. He was found to have multivessel disease on urgent cardiac  catheterization. Referred for urgent CABG. No further anginal symptoms. On statin plus amiodarone (allowing for beta blocker component). No blood pressure room to use ACE inhibitor or ARB. With concern for possible bradycardia we have not used beta blocker in conjunction with amiodarone.      Relevant Orders   EKG 12-Lead   Pulmonary Function Test   Unstable angina    Evaluated for unstable angina back in 14. Have multivessel disease status post CABG. No further angina symptoms since. Doing well. Completed cardiac rehabilitation and maintenance. Now working out vigorously at [employer] He needs refill his when necessary nitroglycerin.      Venous insufficiency of left leg    Compression Hose & standing Lasix + elevation.         Orders Placed This Encounter  Procedures  . EKG 12-Lead  . Pulmonary Function Test    Standing Status: Future     Number of Occurrences:      Standing Expiration Date: 04/03/2016    Order Specific Question:  Where should this test be performed?    Answer:  [facility]    Order Specific Question:  Full PFT    Answer:  Yes   No orders of the defined types were placed in this encounter.     Followup: one year   HARDING, Piedad Climes, M.D., M.S. Interventional Cardiologist   Pager # [phone]

## 2015-04-04 NOTE — Assessment & Plan Note (Signed)
No bleeding complications 

## 2015-04-04 NOTE — Assessment & Plan Note (Signed)
Will need to have labs checked by PCP including TSH and LFTs done during routine evaluation for of lipids. He will also need PFTs checked prior to next followup to evaluate for possible lung toxicity.  Annual Eye exams.  Plan: defer lab work to PCP who follows lipids. PFTs. Continue current dose.

## 2015-04-04 NOTE — Assessment & Plan Note (Signed)
Probably multifactorial related to venous insufficiency and status post saphenous vein harvesting for CABG. Stable amount of edema. No sign of cellulitis. Continued to be relatively well-controlled with standing dose of Lasix plus compression hose.  Continue current plan. Elevate leg when possible.

## 2015-04-04 NOTE — Assessment & Plan Note (Signed)
His age it was associated with A. Fib. He was found to have multivessel disease on urgent cardiac catheterization. Referred for urgent CABG. No further anginal symptoms. On statin plus amiodarone (allowing for beta blocker component). No blood pressure room to use ACE inhibitor or ARB. With concern for possible bradycardia we have not used beta blocker in conjunction with amiodarone.

## 2015-04-04 NOTE — Assessment & Plan Note (Signed)
Evaluated for unstable angina back in 14. Have multivessel disease status post CABG. No further angina symptoms since. Doing well. Completed cardiac rehabilitation and maintenance. Now working out vigorously at [employer]. He needs refill his when necessary nitroglycerin.

## 2015-04-04 NOTE — Assessment & Plan Note (Addendum)
Will continue a regular as opposed to beta blocker based on how intolerant of A. Fib he was postoperatively. Can reassess next year.  Maintaining NSR/S brady as long as he has been on Amiodarone.

## 2015-04-04 NOTE — Assessment & Plan Note (Signed)
Borderline criteria -- needs to get back to exercising.

## 2015-04-04 NOTE — Assessment & Plan Note (Signed)
He is now a year and a half post CABG. Would like to assess followup Myoview at roughly the 3 /12 -4 yr range.

## 2015-04-04 NOTE — Patient Instructions (Signed)
SCHEDULE IN OCT  2016--Your physician has recommended that you have a pulmonary function test. Pulmonary Function Tests are a group of tests that measure how well air moves in and out of your lungs.  CONTINUE WITH CURRENT MEDICATIONS.  Your physician wants you to follow-up in 12 MONTH DR HARDING--- FOLLOW UP PFT RESULTS. 30 MIN APPOINTMENT  You will receive a reminder letter in the mail two months in advance. If you don't receive a letter, please call our office to schedule the follow-up appointment.

## 2015-04-04 NOTE — Assessment & Plan Note (Signed)
Compression Hose & standing Lasix + elevation.

## 2015-04-19 ENCOUNTER — Other Ambulatory Visit: Payer: Self-pay | Admitting: Cardiology

## 2015-04-19 MED ORDER — POTASSIUM CHLORIDE CRYS ER 10 MEQ PO TBCR: 10.0000 meq | EXTENDED_RELEASE_TABLET | Freq: Every day | ORAL | Status: DC

## 2015-04-19 NOTE — Telephone Encounter (Signed)
°  1. Which medications need to be refilled? Potassium-Klor-Con  2. Which pharmacy is [phone] 3. Do they need a 30 day or 90 day supply? 30 and refills  4. Would they like a call back once the medication has been sent to the pharmacy? yes

## 2015-04-19 NOTE — Telephone Encounter (Signed)
E-sent medication. Left message on patient answer machine.

## 2015-05-11 ENCOUNTER — Ambulatory Visit (HOSPITAL_COMMUNITY)
Admission: RE | Admit: 2015-05-11 | Discharge: 2015-05-11 | Disposition: A | Discharge status: Home / Self Care | Payer: PPO | Source: Ambulatory Visit | Attending: Cardiology | Admitting: Cardiology

## 2015-05-11 DIAGNOSIS — Z79899 Other long term (current) drug therapy: Secondary | ICD-10-CM | POA: Diagnosis not present

## 2015-05-11 DIAGNOSIS — I2510 Atherosclerotic heart disease of native coronary artery without angina pectoris: Secondary | ICD-10-CM | POA: Insufficient documentation

## 2015-05-11 DIAGNOSIS — E785 Hyperlipidemia, unspecified: Secondary | ICD-10-CM | POA: Insufficient documentation

## 2015-05-11 DIAGNOSIS — I480 Paroxysmal atrial fibrillation: Secondary | ICD-10-CM | POA: Insufficient documentation

## 2015-05-11 LAB — PULMONARY FUNCTION TEST
DL/VA % pred: 82 %
DL/VA: 3.68 ml/min/mmHg/L
DLCO unc % pred: 63 %
DLCO unc: 18.8 ml/min/mmHg
FEF 25-75 POST: 1.82 L/s
FEF 25-75 Pre: 2.2 L/sec
FEF2575-%CHANGE-POST: -17 %
FEF2575-%PRED-PRE: 90 %
FEF2575-%Pred-Post: 75 %
FEV1-%Change-Post: -3 %
FEV1-%PRED-POST: 84 %
FEV1-%PRED-PRE: 88 %
FEV1-POST: 2.64 L
FEV1-Pre: 2.74 L
FEV1FVC-%CHANGE-POST: 6 %
FEV1FVC-%Pred-Pre: 101 %
FEV6-%Change-Post: -7 %
FEV6-%PRED-PRE: 90 %
FEV6-%Pred-Post: 83 %
FEV6-Post: 3.32 L
FEV6-Pre: 3.58 L
FEV6FVC-%Change-Post: 2 %
FEV6FVC-%Pred-Post: 106 %
FEV6FVC-%Pred-Pre: 104 %
FVC-%CHANGE-POST: -9 %
FVC-%Pred-Post: 79 %
FVC-%Pred-Pre: 87 %
FVC-PRE: 3.66 L
FVC-Post: 3.32 L
POST FEV1/FVC RATIO: 80 %
PRE FEV1/FVC RATIO: 75 %
PRE FEV6/FVC RATIO: 98 %
Post FEV6/FVC ratio: 100 %
RV % pred: 81 %
RV: 1.86 L
TLC % pred: 82 %
TLC: 5.46 L

## 2015-05-11 MED ORDER — ALBUTEROL SULFATE (2.5 MG/3ML) 0.083% IN NEBU
2.5000 mg | INHALATION_SOLUTION | Freq: Once | RESPIRATORY_TRACT | Status: AC
  Administered 2015-05-11: 2.5 mg via RESPIRATORY_TRACT

## 2015-05-18 NOTE — Progress Notes (Signed)
By my math that is a 20% drop.  Is the patient having any more symptoms (dyspnea or cough)?  Diagnostic criteria suggests they need 2 of 5 criteria to be considered pulmonary toxicity (new symptoms, x-ray showing abnormalities such as infiltrates, 20% drop in DLCO, abnormal uptake with gallium-67 and histologic changes in biopsy).  I would probably stop the amiodarone rather than do all that extra testing, and with that 20% drop I would recommend just stopping amiodarone and going a different route.

## 2015-06-16 NOTE — Progress Notes (Signed)
Quick Note:  Based upon the PFT results & discussion with [name], I think that we need to stop amiodarone to avoid toxicity.   Hopefully, Mr. Alfred Thompson will not have a recurrence of Afib - it was post-operative Afib. I would like to eventually start a low dose Beta blocker, but will need to see his BP & HR /EKG off of Amio x 1 month.  Can have him come in to see me or a PA/NP to review Symptoms & BP/HR/EKG -- if ok, start low dose Toprol 25 mg.  If Afib does come back & symptomatic, will probably need to consider Tikosyn or Sotalol (both require hospital stay to initiate)  DH  Pls CC to Dr. Martie Lee (PCP)  ______

## 2015-06-22 ENCOUNTER — Telehealth: Payer: Self-pay | Admitting: *Deleted

## 2015-06-22 NOTE — Telephone Encounter (Signed)
Left message to cal back In regards to PFT.

## 2015-06-22 NOTE — Telephone Encounter (Signed)
-----   Message from Marykay Lexavid W Harding, MD sent at 05/16/2015  7:45 AM EDT ----- There was a drop in "DLCO" which is the diffusion capacity across the lung membranes.  This is something that we need to monitor closely when people are on Amiodarone.    I will confer with our Pharmacist to see if the drop is significant enough to warrant stopping amiodarone.  If we do stop Amiodarone, we will probably need to adjust medications a bit - meaning that we may need a f/u appointment sooner.  HARDING, Piedad ClimesAVID W, MD  Pls forward to Dr. Farris HasAaron Morrow.

## 2015-06-22 NOTE — Telephone Encounter (Signed)
SPOKE TO PATIENT   INSTRUCTION GIVEN- WILL HOLD AMIODARONE x1 month ( or until he see extender)  APPOINTMENT IS NEED WITH AN EXTENDER IN ONE MONTH SOMETIME AFTER July 30 ,2016  PATIENT IS WILLING TO SEE AN EXTENDER AT [facility] IF NEEDED.

## 2015-07-11 ENCOUNTER — Other Ambulatory Visit: Payer: Self-pay | Admitting: Cardiology

## 2015-07-11 MED ORDER — APIXABAN 5 MG PO TABS: 5.0000 mg | ORAL_TABLET | Freq: Two times a day (BID) | ORAL | Status: DC

## 2015-07-11 NOTE — Telephone Encounter (Signed)
°  1. Which medications need to be refilled? Eliquis   2. Which pharmacy is medication to be sent to?[facility] in [place]   3. Do they need a 30 day or 90 day supply? 30  4. Would they like a call back once the medication has been sent to the pharmacy? Yes

## 2015-07-25 ENCOUNTER — Other Ambulatory Visit: Payer: Self-pay | Admitting: Cardiovascular Disease

## 2015-07-25 NOTE — Telephone Encounter (Signed)
REFILL 

## 2015-07-26 ENCOUNTER — Ambulatory Visit (INDEPENDENT_AMBULATORY_CARE_PROVIDER_SITE_OTHER): Payer: PPO | Admitting: Physician Assistant

## 2015-07-26 ENCOUNTER — Encounter: Payer: Self-pay | Admitting: Physician Assistant

## 2015-07-26 VITALS — BP 122/70 | HR 59 | Ht 68.0 in | Wt 205.4 lb

## 2015-07-26 DIAGNOSIS — Z7901 Long term (current) use of anticoagulants: Secondary | ICD-10-CM

## 2015-07-26 DIAGNOSIS — I4891 Unspecified atrial fibrillation: Secondary | ICD-10-CM

## 2015-07-26 DIAGNOSIS — I2510 Atherosclerotic heart disease of native coronary artery without angina pectoris: Secondary | ICD-10-CM

## 2015-07-26 DIAGNOSIS — Z951 Presence of aortocoronary bypass graft: Secondary | ICD-10-CM

## 2015-07-26 DIAGNOSIS — I480 Paroxysmal atrial fibrillation: Secondary | ICD-10-CM | POA: Diagnosis not present

## 2015-07-26 MED ORDER — METOPROLOL SUCCINATE ER 25 MG PO TB24: 25.0000 mg | ORAL_TABLET | Freq: Every day | ORAL | Status: DC

## 2015-07-26 NOTE — Patient Instructions (Addendum)
Your physician has recommended you make the following change in your medication: start new metoprolol succ 25 mg prescription. This has already been sent in to your pharmacy. If you notice any lightheadedness or dizziness discontinue and call our office to notify. STOP the amiodarone.  Check your blood pressure and heart rate for the next couple of weeks.  Your physician recommends that you schedule a follow-up appointment in: 3 months with Dr. Herbie Baltimore.

## 2015-07-26 NOTE — Progress Notes (Signed)
Patient ID: Alfred Thompson, male   DOB: [birth year], 69 y.o.   MRN: [id number]    Date:  07/26/2015   ID:  Alfred Thompson, DOB 1946/12/05, MRN [id number]  PCP:  Farris Has, MD  Primary Cardiologist:  Herbie Baltimore   Chief Complaint  Patient presents with  . 1 mo rov    patient reports no problems since last visit. " been feeling fine."     History of Present Illness: [name] is a 69 y.o. male  with a PMH below who presents for followup for his CAD-CABG & PAF.  His amiodarone was discontinued a month ago when his PFTs showed a decreased DLCO diffusion capacity. The patient reports at the time of the test he was feeling like he had a bit of a respiratory issue/congestion.  Today he feels great.  With no issues.  He wears compression socks just about daily.  He's been off the amio for a month.     The patient currently denies nausea, vomiting, fever, chest pain, shortness of breath, orthopnea, dizziness, PND, cough, congestion, abdominal pain, hematochezia, melena, claudication.  Wt Readings from Last 3 Encounters:  07/26/15 205 lb 6.4 oz (93.169 kg)  04/04/15 208 lb 6.4 oz (94.53 kg)  09/23/14 207 lb (93.895 kg)     Past Medical History  Diagnosis Date  . Unstable angina 10/09/2013  . Left main coronary artery disease 10/09/2013    Distal Left Main 80-90%; ostial/proximal LAD 50-80%, proximal RI 80%, proximal RCA napkin ring 80-90%; relatively normal Circumflex/Lateral OM --> referred for CABG; 2-D echo March 2015: EF 55-60% with no regional wall motion on his  . S/P CABG x 4 10/10/2013    (LIMA to LAD, SVG to OM, SVG to RAMUS INTERMEDIATE, and SVG to RCA  . Paroxysmal a-fib 10/09/2013    Admitted with Afib RVR, 2 episodes post-op.  . Obesity (BMI 30-39.9)   . Back pain, chronic     Current Outpatient Prescriptions  Medication Sig Dispense Refill  . apixaban (ELIQUIS) 5 MG TABS tablet Take 1 tablet (5 mg total) by mouth 2 (two) times daily. 60 tablet 9  . atorvastatin (LIPITOR) 40  MG tablet TAKE ONE TABLET BY MOUTH ONCE DAILY AT  6PM 30 tablet 10  . furosemide (LASIX) 40 MG tablet Take 1 tablet (40 mg total) by mouth daily. 90 tablet 2  . metoprolol succinate (TOPROL XL) 25 MG 24 hr tablet Take 1 tablet (25 mg total) by mouth daily. 30 tablet 6  . potassium chloride (KLOR-CON M10) 10 MEQ tablet Take 1 tablet (10 mEq total) by mouth daily. 30 tablet 6  . [DISCONTINUED] potassium chloride (K-DUR) 10 MEQ tablet Take 1 tablet (10 mEq total) by mouth daily. 30 tablet 6   No current facility-administered medications for this visit.    Allergies:   No Known Allergies  Social History:  The patient  reports that he has never smoked. He has never used smokeless tobacco. He reports that he drinks alcohol. He reports that he does not use illicit drugs.   Family history:   Family History  Problem Relation Age of Onset  . Coronary artery disease Father 74    CABG  . Rectal cancer Mother   . Transient ischemic attack Mother     ROS:  Please see the history of present illness.  All other systems reviewed and negative.   PHYSICAL EXAM: VS:  BP 122/70 mmHg  Pulse 59  Ht  (1.727 m)  Wt 205 lb 6.4 oz (93.169 kg)  BMI 31.24 kg/m2 Well nourished, well developed, in no acute distress HEENT: Pupils are equal round react to light accommodation extraocular movements are intact.  Neck: no JVDNo cervical lymphadenopathy. Cardiac: Regular rate and rhythm without murmurs rubs or gallops. Lungs:  clear to auscultation bilaterally, no wheezing, rhonchi or rales Abd: soft, nontender, positive bowel sounds all quadrants, no hepatosplenomegaly Ext: no lower extremity edema.  2+ radial and dorsalis pedis pulses. Skin: warm and dry Neuro:  Grossly normal  EKG:  Sinus brady 59    ASSESSMENT AND PLAN:  Problem List Items Addressed This Visit    Severe multivessel CAD: CABG x4 October 2014 (Chronic)   Relevant Medications   metoprolol succinate (TOPROL XL) 25 MG 24 hr tablet    Other Relevant Orders   EKG 12-Lead   S/P CABG x 4 (Chronic)    No angina.  Very active and high energy patient.      PAF (paroxysmal atrial fibrillation), currently in sinus bradycardia on amiodarone (Chronic)    Maintaining Sinus rhythm with a rate of 59.  BP is controlled.  He looks great.  I think we can try the toprol 25.  We discussed the side effects and when to discontinue.  He will monitor his HR for a couple weeks.        Relevant Medications   metoprolol succinate (TOPROL XL) 25 MG 24 hr tablet   Chronic anticoagulation, for PAF with eliquis (Chronic)   Atrial fibrillation with RVR  pre and post op - Primary (Chronic)   Relevant Medications   metoprolol succinate (TOPROL XL) 25 MG 24 hr tablet   Other Relevant Orders   EKG 12-Lead

## 2015-07-26 NOTE — Assessment & Plan Note (Signed)
Maintaining Sinus rhythm with a rate of 59.  BP is controlled.  He looks great.  I think we can try the toprol 25.  We discussed the side effects and when to discontinue.  He will monitor his HR for a couple weeks.

## 2015-07-26 NOTE — Assessment & Plan Note (Signed)
No angina.  Very active and high energy patient.

## 2015-10-07 ENCOUNTER — Telehealth: Payer: Self-pay | Admitting: *Deleted

## 2015-10-07 NOTE — Telephone Encounter (Signed)
I spoke with patient and he wants to wait and see Dr. Herbie BaltimoreHarding in November before he schedules the PFTs.

## 2015-10-07 NOTE — Telephone Encounter (Signed)
Left message for patient to call to schedule PFT that was ordered by Dr. Herbie BaltimoreHarding.

## 2015-11-08 ENCOUNTER — Ambulatory Visit (INDEPENDENT_AMBULATORY_CARE_PROVIDER_SITE_OTHER): Payer: PPO | Admitting: Cardiology

## 2015-11-08 VITALS — BP 124/66 | HR 60 | Ht 68.0 in | Wt 209.5 lb

## 2015-11-08 DIAGNOSIS — E669 Obesity, unspecified: Secondary | ICD-10-CM

## 2015-11-08 DIAGNOSIS — I480 Paroxysmal atrial fibrillation: Secondary | ICD-10-CM | POA: Diagnosis not present

## 2015-11-08 DIAGNOSIS — I2510 Atherosclerotic heart disease of native coronary artery without angina pectoris: Secondary | ICD-10-CM | POA: Diagnosis not present

## 2015-11-08 DIAGNOSIS — I872 Venous insufficiency (chronic) (peripheral): Secondary | ICD-10-CM

## 2015-11-08 DIAGNOSIS — Z8679 Personal history of other diseases of the circulatory system: Secondary | ICD-10-CM

## 2015-11-08 DIAGNOSIS — Z7901 Long term (current) use of anticoagulants: Secondary | ICD-10-CM

## 2015-11-08 DIAGNOSIS — E785 Hyperlipidemia, unspecified: Secondary | ICD-10-CM | POA: Diagnosis not present

## 2015-11-08 MED ORDER — APIXABAN 5 MG PO TABS: 5.0000 mg | ORAL_TABLET | Freq: Two times a day (BID) | ORAL | Status: DC

## 2015-11-08 NOTE — Patient Instructions (Signed)
Your physician recommends that you schedule a follow-up appointment in 6 MONTH DR Wilburt FinlayBRYAN HAGER PA  Your physician wants you to follow-up in 12 MONTH WITH DR HARDING. You will receive a reminder letter in the mail two months in advance. If you don't receive a letter, please call our office to schedule the follow-up appointment.  IF YOU NEED SAMPLES-ELIQUIS -CALL THE OFFICE   If you need a refill on your cardiac medications before your next appointment, please call your pharmacy.

## 2015-11-08 NOTE — Progress Notes (Signed)
PCP: Farris Has, MD  Clinic Note: Chief Complaint  Patient presents with  . Follow-up    3 month, pt denied chest pain and SOB, pt denied swelling in legs ankles and feet.  . Coronary Artery Disease  . Atrial Fibrillation    Postop; now off amiodarone    HPI: Alfred Thompson is a 69 y.o. male with a PMH below who presents today for 3 month follow-up of CAD-CABG & PAF with Amiodarone d/c due to decreased DLCO.    Alfred Thompson was last seen by me in April 2016 & last by Wilburt Finlay, PA on Aug 2nd.  Recent Hospitalizations: None  Studies Reviewed: None  Interval History: Alfred Thompson presents today really doing pretty well without any significant symptoms. He has not been as routine with his exercise. Saying that its, off and on depending on what else is going on his life. With whatever he is doing, he denies any resting or exertional chest tightness/pressure or dyspnea.  No PND, orthopnea or edema -- he still wears compression stockings routinely for edema and mild varicose veins. Uses Lasix when necessary. No palpitations, lightheadedness, dizziness, weakness or syncope/near syncope. No TIA/amaurosis fugax symptoms. No claudication.  ROS: A comprehensive was performed. Review of Systems  Constitutional: Negative for malaise/fatigue.  HENT: Negative for nosebleeds.   Respiratory: Negative for cough, shortness of breath and wheezing.   Cardiovascular: Negative for claudication.  Gastrointestinal: Negative for blood in stool and melena.  Genitourinary: Negative for hematuria.  Musculoskeletal: Positive for joint pain. Negative for myalgias (Or cramps) and falls.  Neurological: Negative for dizziness, focal weakness, weakness and headaches.  Endo/Heme/Allergies: Does not bruise/bleed easily.  Psychiatric/Behavioral: Negative.  Negative for depression.  All other systems reviewed and are negative.   Past Medical History  Diagnosis Date  . Unstable angina 10/09/2013  . Left main  coronary artery disease 10/09/2013    Distal Left Main 80-90%; ostial/proximal LAD 50-80%, proximal RI 80%, proximal RCA napkin ring 80-90%; relatively normal Circumflex/Lateral OM --> referred for CABG; 2-D echo March 2015: EF 55-60% with no regional wall motion on his  . S/P CABG x 4 10/10/2013    (LIMA to LAD, SVG to OM, SVG to RAMUS INTERMEDIATE, and SVG to RCA  . Paroxysmal a-fib 10/09/2013    Admitted with Afib RVR, 2 episodes post-op.  . Obesity (BMI 30-39.9)   . Back pain, chronic     Past Surgical History  Procedure Laterality Date  . Hernia repair    . Coronary artery bypass graft N/A 10/10/2013    Procedure: CORONARY ARTERY BYPASS GRAFTING (CABG);  Surgeon: Alleen Borne, MD;  Location: [facility] OR;  Service: Open Heart Surgery;  Laterality: N/A;  Times 4 using left internal mammary artery and endoscopically harvested left saphenous vein  . Intraoperative transesophageal echocardiogram N/A 10/10/2013    Procedure: INTRAOPERATIVE TRANSESOPHAGEAL ECHOCARDIOGRAM;  Surgeon: Alleen Borne, MD;  Location[facility] OR;  Service: Open Heart Surgery;  Laterality: N/A;  . Dupuytren / palmar fasciotomy Right   . Left heart catheterization with coronary angiogram N/A 10/09/2013    Procedure: LEFT HEART CATHETERIZATION WITH CORONARY ANGIOGRAM;  Surgeon: Marykay Lex, MD;  Location: [facility] CATH LAB;  Distal Left Main 80-90%; ostial/proximal LAD 50-80%, proximal RI 80%, proximal RCA napkin ring 80-90%; relatively normal Circumflex/Lateral OM --> referred for CABG   Prior to Admission medications   Medication Sig Start Date End Date Taking? Authorizing Provider  apixaban (ELIQUIS) 5 MG TABS tablet Take 1 tablet (  5 mg total) by mouth 2 (two) times daily. 07/11/15  Yes Marykay Lex, MD  atorvastatin (LIPITOR) 40 MG tablet TAKE ONE TABLET BY MOUTH ONCE DAILY AT  [facility] 07/25/15  Yes Mihai Croitoru, MD  furosemide (LASIX) 40 MG tablet Take 1 tablet (40 mg total) by mouth daily. 02/07/15  Yes Marykay Lex, MD    metoprolol succinate (TOPROL XL) 25 MG 24 hr tablet Take 1 tablet (25 mg total) by mouth daily. 07/26/15  Yes Dwana Melena, PA-C  potassium chloride (KLOR-CON M10) 10 MEQ tablet Take 1 tablet (10 mEq total) by mouth daily. 04/19/15  Yes Marykay Lex, MD   No Known Allergies  Social History   Social History  . Marital Status: Single    Spouse Name: N/A  . Number of Children: N/A  . Years of Education: N/A   Social History Main Topics  . Smoking status: Never Smoker   . Smokeless tobacco: Never Used  . Alcohol Use: 0.0 oz/week    0 Standard drinks or equivalent per week     Comment: occasional  . Drug Use: No  . Sexual Activity: Not on file   Other Topics Concern  . Not on file   Social History Narrative   Married, one son, no grandchildren. He drives for [employer]   Family History  Problem Relation Age of Onset  . Coronary artery disease Father 66    CABG  . Rectal cancer Mother   . Transient ischemic attack Mother     Wt Readings from Last 3 Encounters:  11/08/15 209 lb 8 oz (95.029 kg)  07/26/15 205 lb 6.4 oz (93.169 kg)  04/04/15 208 lb 6.4 oz (94.53 kg)    PHYSICAL EXAM BP 124/66 mmHg  Pulse 60  Ht  (1.727 m)  Wt 209 lb 8 oz (95.029 kg)  BMI 31.86 kg/m2 General appearance: alert, cooperative, appears stated age, no distress and mildly obese; quite talkative  HEENT: [place]/AT, EOMI, MMM, anicteric sclera Neck: no adenopathy, no carotid bruit and no JVD  Heart: RRR, S1, S2 normal, no murmur, click, rub or gallop, nondisplaced PMI.  Lungs: clear to auscultation bilaterally, normal percussion bilaterally and non-labored  Abdomen: soft, non-tender; bowel sounds normal; no masses, no organomegaly; mild obesity  Extremities: No C/C with ~1+ LLE edema  Pulses: 2+ and symmetric; normal  Neurologic:Alert, oriented, thought content appropriate; grossly normal; normal mood & affect Cranial nerves: normal (II-XII grossly intact);   Adult ECG  Report Not checked  Other studies Reviewed: Additional studies/ records that were reviewed today include:  Recent Labs:  Checked by PCP, not currently available Lab Results  Component Value Date   CHOL 95 09/28/2014   HDL 41 09/28/2014   LDLCALC 35 09/28/2014   TRIG 96 09/28/2014   CHOLHDL 2.3 09/28/2014    ASSESSMENT / PLAN: Problem List Items Addressed This Visit    Venous insufficiency of left leg    Wears compression hose. When necessary Lasix. Foot elevation.      Relevant Medications   apixaban (ELIQUIS) 5 MG TABS tablet   Severe multivessel CAD: CABG x4 October 2014 - Primary (Chronic)    No recurrent anginal symptoms. Hopefully he will get back into routine exercise regimen. On statin plus beta blocker. Not on aspirin because he is on ELIQUIS. Has had low blood pressures in the past, precluding ace inhibitors or ARB's. Is on low-dose beta blocker now that he is off of amiodarone.  Relevant Medications   apixaban (ELIQUIS) 5 MG TABS tablet   PAF (paroxysmal atrial fibrillation), no longer on amiodarone secondary to PFT concerns (Chronic)    Maintaining sinus rhythm/bradycardia on low-dose beta blocker. As far as I can tell, he is not had any further breakthrough events of A. Fib. Remains on ELIQUIS.  This patients CHA2DS2-VASc Score and unadjusted Ischemic Stroke Rate (% per year) is equal to 3.2 % stroke rate/year from a score of 3  Above score calculated as 1 point each if present [CHF, HTN, DM, Vascular=MI/PAD/Aortic Plaque, Age if 65-74, or Male] Above score calculated as 2 points each if present [Age > 75, or Stroke/TIA/TE]        Relevant Medications   apixaban (ELIQUIS) 5 MG TABS tablet   Obesity (BMI 30-39.9) (Chronic)    Needs to get back to exercising. We discussed this again. Also with the holidays upcoming, needs to monitor to ensure he does not gain any more weight.      Hyperlipidemia LDL goal <70 (Chronic)    On moderate dose atorvastatin.  He believes his PCP has been checking. Last check was October 2015.  If not checked by PCP, should order lipid panel and LFTs.      Relevant Medications   apixaban (ELIQUIS) 5 MG TABS tablet   History of unstable angina (Chronic)    No further episodes following CABG. Needs to get back into his exercise regimen.      Chronic anticoagulation, for PAF with eliquis (Chronic)      Current medicines are reviewed at length with the patient today. (+/- concerns) concerned about ELIQUIS cost. Asked for samples or assistance until out of the donut hole. The following changes have been made: None -- samples for ELIQUIS provided Studies Ordered:   No orders of the defined types were placed in this encounter.    Your physician recommends that you schedule a follow-up appointment in 6 MONTH DR Wilburt FinlayBRYAN HAGER PA  Your physician wants you to follow-up in 12 MONTH WITH DR Gregary Blackard.    Marykay LexHARDING, Ronav Furney W, M.D., M.S. Interventional Cardiologist   Pager # [phone]

## 2015-11-10 ENCOUNTER — Encounter: Payer: Self-pay | Admitting: Cardiology

## 2015-11-10 NOTE — Assessment & Plan Note (Signed)
No further episodes following CABG. Needs to get back into his exercise regimen.

## 2015-11-10 NOTE — Assessment & Plan Note (Signed)
Wears compression hose. When necessary Lasix. Foot elevation.

## 2015-11-10 NOTE — Assessment & Plan Note (Signed)
Maintaining sinus rhythm/bradycardia on low-dose beta blocker. As far as I can tell, he is not had any further breakthrough events of A. Fib. Remains on ELIQUIS.  This patients CHA2DS2-VASc Score and unadjusted Ischemic Stroke Rate (% per year) is equal to 3.2 % stroke rate/year from a score of 3  Above score calculated as 1 point each if present [CHF, HTN, DM, Vascular=MI/PAD/Aortic Plaque, Age if 65-74, or Male] Above score calculated as 2 points each if present [Age > 75, or Stroke/TIA/TE]

## 2015-11-10 NOTE — Assessment & Plan Note (Signed)
On moderate dose atorvastatin. He believes his PCP has been checking. Last check was October 2015.  If not checked by PCP, should order lipid panel and LFTs.

## 2015-11-10 NOTE — Assessment & Plan Note (Signed)
No recurrent anginal symptoms. Hopefully he will get back into routine exercise regimen. On statin plus beta blocker. Not on aspirin because he is on ELIQUIS. Has had low blood pressures in the past, precluding ace inhibitors or ARB's. Is on low-dose beta blocker now that he is off of amiodarone.

## 2015-11-10 NOTE — Assessment & Plan Note (Signed)
Needs to get back to exercising. We discussed this again. Also with the holidays upcoming, needs to monitor to ensure he does not gain any more weight.

## 2015-12-20 ENCOUNTER — Other Ambulatory Visit: Payer: Self-pay | Admitting: Cardiology

## 2016-03-05 ENCOUNTER — Telehealth: Payer: Self-pay

## 2016-03-05 NOTE — Telephone Encounter (Signed)
Please call pt regarding refill on Metoprolol ER 25 mg,  [phone]

## 2016-03-07 ENCOUNTER — Telehealth: Payer: Self-pay | Admitting: Cardiology

## 2016-03-07 MED ORDER — METOPROLOL SUCCINATE ER 25 MG PO TB24: 25.0000 mg | ORAL_TABLET | Freq: Every day | ORAL | Status: DC

## 2016-03-07 NOTE — Telephone Encounter (Signed)
Need refill per wife-- metoprolol xl 25 mg E-sent #90  Supply x3

## 2016-03-07 NOTE — Telephone Encounter (Signed)
Left message on voice mail  to call back

## 2016-03-07 NOTE — Telephone Encounter (Signed)
New message ° ° ° ° °Returning a call to the nurse °

## 2016-05-17 ENCOUNTER — Encounter: Payer: Self-pay | Admitting: Physician Assistant

## 2016-05-17 ENCOUNTER — Ambulatory Visit (INDEPENDENT_AMBULATORY_CARE_PROVIDER_SITE_OTHER): Payer: PPO | Admitting: Physician Assistant

## 2016-05-17 VITALS — BP 92/68 | HR 71 | Ht 68.0 in | Wt 206.0 lb

## 2016-05-17 DIAGNOSIS — Z7901 Long term (current) use of anticoagulants: Secondary | ICD-10-CM

## 2016-05-17 DIAGNOSIS — E669 Obesity, unspecified: Secondary | ICD-10-CM | POA: Diagnosis not present

## 2016-05-17 DIAGNOSIS — R600 Localized edema: Secondary | ICD-10-CM

## 2016-05-17 DIAGNOSIS — I480 Paroxysmal atrial fibrillation: Secondary | ICD-10-CM | POA: Diagnosis not present

## 2016-05-17 DIAGNOSIS — I2510 Atherosclerotic heart disease of native coronary artery without angina pectoris: Secondary | ICD-10-CM

## 2016-05-17 DIAGNOSIS — E785 Hyperlipidemia, unspecified: Secondary | ICD-10-CM

## 2016-05-17 NOTE — Progress Notes (Signed)
Patient ID: Alfred Thompson, male   DOB: 08/04/1946, 70 y.o.   MRN: [id number]    Date:  05/17/2016   ID:  Alfred Thompson, DOB 03/24/1946, MRN [id number]  PCP:  Alfred HasMORROW, [name], MD  Primary Cardiologist:  Alfred Thompson   Chief Complaint  Patient presents with  . Follow-up    asymptomatic today     History of Present Illness: Alfred Thompson is a 70 y.o. male history of coronary artery disease and is status post coronary artery bypass grafting 4 with a LIMA to the LAD, SVG to the OM, SVG to the ramus, SVG to the RCA. He also has a history of paroxysmal atrial fibrillation obesity. [facility]   He was last seen by Dr. Herbie Thompson November 2016. He presents for six-month evaluation.  He reports doing well. He does have some chronic left lower extremity edema and usually wears compression socks. He reports infrequently feeling slight dizziness but nothing severe or persistent. He denies nausea, vomiting, fever, chest pain, shortness of breath, orthopnea, PND, cough, congestion, abdominal pain, hematochezia, melena, claudication.  He also states he takes his Lasix usually every other day and not daily.  Sometimes it on the third day.  Wt Readings from Last 3 Encounters:  05/17/16 206 lb (93.441 kg)  11/08/15 209 lb 8 oz (95.029 kg)  07/26/15 205 lb 6.4 oz (93.169 kg)     Past Medical History  Diagnosis Date  . Unstable angina (HCC) 10/09/2013  . Left main coronary artery disease 10/09/2013    Distal Left Main 80-90%; ostial/proximal LAD 50-80%, proximal RI 80%, proximal RCA napkin ring 80-90%; relatively normal Circumflex/Lateral OM --> referred for CABG; 2-D echo March 2015: EF 55-60% with no regional wall motion on his  . S/P CABG x 4 10/10/2013    (LIMA to LAD, SVG to OM, SVG to RAMUS INTERMEDIATE, and SVG to RCA  . Paroxysmal a-fib (HCC) 10/09/2013    Admitted with Afib RVR, 2 episodes post-op.  . Obesity (BMI 30-39.9)   . Back pain, chronic     Current Outpatient  Prescriptions  Medication Sig Dispense Refill  . apixaban (ELIQUIS) 5 MG TABS tablet Take 1 tablet (5 mg total) by mouth 2 (two) times daily. 60 tablet 9  . atorvastatin (LIPITOR) 40 MG tablet TAKE ONE TABLET BY MOUTH ONCE DAILY AT  6PM 30 tablet 10  . furosemide (LASIX) 40 MG tablet Take 40 mg by mouth every other day.    Marland Kitchen. KLOR-CON M10 10 MEQ tablet TAKE ONE TABLET BY MOUTH ONCE DAILY 30 tablet 10  . metoprolol succinate (TOPROL XL) 25 MG 24 hr tablet Take 1 tablet (25 mg total) by mouth daily. 90 tablet 3  . [DISCONTINUED] potassium chloride (K-DUR) 10 MEQ tablet Take 1 tablet (10 mEq total) by mouth daily. 30 tablet 6   No current facility-administered medications for this visit.    Allergies:   No Known Allergies  Social History:  The patient  reports that he has never smoked. He has never used smokeless tobacco. He reports that he drinks alcohol. He reports that he does not use illicit drugs.   Family history:   Family History  Problem Relation Age of Onset  . Coronary artery disease Father [id number]    CABG  . Rectal cancer Mother   . Transient ischemic attack Mother     ROS:  Please see the history of present illness.  All other systems reviewed and negative.  PHYSICAL EXAM: VS:  BP 92/68 mmHg  Pulse 71  Ht  (1.727 m)  Wt 206 lb (93.441 kg)  BMI 31.33 kg/m2 Obese, well developed, in no acute distress HEENT: Pupils are equal round react to light accommodation extraocular movements are intact.  Neck: no JVDNo cervical lymphadenopathy. Cardiac: Regular rate and rhythm without murmurs rubs or gallops. Lungs:  clear to auscultation bilaterally, no wheezing, rhonchi or rales Abd: soft, nontender, positive bowel sounds all quadrants, no hepatosplenomegaly Ext: 1+ left lower extremity edema and a trace on the right .  2+ radial and dorsalis pedis pulses. Skin: warm and dry Neuro:  Grossly normal  EKG:  Sinus rhythm with premature atrial contractions in a bigeminy  pattern  ASSESSMENT AND PLAN:  Problem List Items Addressed This Visit    Severe multivessel CAD: CABG x4 October 2014 (Chronic)   Relevant Medications   furosemide (LASIX) 40 MG tablet   PAF (paroxysmal atrial fibrillation), no longer on amiodarone secondary to PFT concerns - Primary (Chronic)   Relevant Medications   furosemide (LASIX) 40 MG tablet   Obesity (BMI 30-39.9) (Chronic)   Leg edema, left   Hyperlipidemia LDL goal <70 (Chronic)   Relevant Medications   furosemide (LASIX) 40 MG tablet   Chronic anticoagulation, for PAF with eliquis (Chronic)     Alfred Thompson appears to be doing well. He denies any angina.  His chronic left lower extremity edema for which he takes Lasix usually every other day. Recommended he continue with compression socks as well. He is in sinus rhythm with regular PACs, currently in a bigeminy pattern. Continue beta blocker and Eliquis. He is mildly hypotensive and asymptomatic.  Continue statin for lipids. Recommended increasing exercise and low carb diet. Follow-up in 6 months.

## 2016-05-17 NOTE — Patient Instructions (Signed)
Your physician wants you to follow-up in: 6 months with Dr. Harding. You will receive a reminder letter in the mail two months in advance. If you don't receive a letter, please call our office to schedule the follow-up appointment.  

## 2016-07-10 ENCOUNTER — Other Ambulatory Visit: Payer: Self-pay | Admitting: Cardiology

## 2016-08-13 ENCOUNTER — Other Ambulatory Visit: Payer: Self-pay | Admitting: Cardiovascular Disease

## 2016-08-13 ENCOUNTER — Other Ambulatory Visit: Payer: Self-pay | Admitting: Cardiology

## 2016-08-13 MED ORDER — ATORVASTATIN CALCIUM 40 MG PO TABS: 40.0000 mg | ORAL_TABLET | Freq: Every day | ORAL | 3 refills | Status: DC

## 2016-10-16 DIAGNOSIS — R972 Elevated prostate specific antigen [PSA]: Secondary | ICD-10-CM | POA: Diagnosis not present

## 2016-12-18 ENCOUNTER — Other Ambulatory Visit: Payer: Self-pay | Admitting: Cardiology

## 2016-12-19 ENCOUNTER — Other Ambulatory Visit: Payer: Self-pay | Admitting: Cardiology

## 2016-12-25 ENCOUNTER — Telehealth: Payer: Self-pay | Admitting: Cardiology

## 2016-12-25 NOTE — Telephone Encounter (Signed)
Called patient and left a voicemail regarding his appt on 02-01-17 with Dr. Herbie BaltimoreHarding. Calendar mailed to the patient.

## 2017-01-24 HISTORY — PX: NM MYOVIEW LTD: HXRAD82

## 2017-02-01 ENCOUNTER — Ambulatory Visit: Payer: PPO | Admitting: Cardiology

## 2017-02-04 ENCOUNTER — Ambulatory Visit (INDEPENDENT_AMBULATORY_CARE_PROVIDER_SITE_OTHER): Payer: PPO | Admitting: Cardiology

## 2017-02-04 ENCOUNTER — Encounter: Payer: Self-pay | Admitting: Cardiology

## 2017-02-04 VITALS — BP 124/73 | HR 62 | Ht 68.0 in | Wt 211.6 lb

## 2017-02-04 DIAGNOSIS — Z951 Presence of aortocoronary bypass graft: Secondary | ICD-10-CM

## 2017-02-04 DIAGNOSIS — I2510 Atherosclerotic heart disease of native coronary artery without angina pectoris: Secondary | ICD-10-CM

## 2017-02-04 DIAGNOSIS — E785 Hyperlipidemia, unspecified: Secondary | ICD-10-CM | POA: Diagnosis not present

## 2017-02-04 DIAGNOSIS — I872 Venous insufficiency (chronic) (peripheral): Secondary | ICD-10-CM

## 2017-02-04 DIAGNOSIS — Z79899 Other long term (current) drug therapy: Secondary | ICD-10-CM | POA: Diagnosis not present

## 2017-02-04 DIAGNOSIS — I480 Paroxysmal atrial fibrillation: Secondary | ICD-10-CM | POA: Diagnosis not present

## 2017-02-04 NOTE — Patient Instructions (Signed)
Your physician recommends that you return for lab work FASTING (CMET, lipid)  Your physician wants you to follow-up in: ONE YEAR with Dr. Herbie BaltimoreHarding after exercise myoview (stress test). You will receive a reminder letter in the mail two months in advance. If you don't receive a letter, please call our office to schedule the follow-up appointment.

## 2017-02-04 NOTE — Progress Notes (Signed)
PCP: [name], [name], MD  Clinic Note: Chief Complaint  Patient presents with  . 1 year visit    no complaints  . Coronary Artery Disease    h/o CABG  . Atrial Fibrillation    post-op    HPI: Alfred Thompson is a 71 y.o. male with a PMH below who presents today for annual follow-up of his CAD-CABG and PAF treated with amiodarone initially. Amiodarone since he stopped because of DLCO reduction. He initially was seen in October 2014 for unstable angina and underwent cath showing left main disease. He subsequently underwent CABG 4. This was on the setting of A. fib RVR. He apparently had at least 2 episodes of postop A. fib  I last saw him on November 2016. He was doing well at that time. Wearing compression stockings for edema and varicose veins. Urine Lasix. Alfred GamblerRonald C Benham was last seen in May 2017 by Wilburt FinlayBryan Hager, PA. He noticed some intermittent dizziness but nothing significant. Was doing well with no recurrent angina. No recurrent A. fib. No bleeding on ELIQUIS.  Recent Hospitalizations: None  Studies Reviewed: None  Interval History: Windy FastRonald presents today doing quite well overall from a cardiac standpoint. He denies any resting or exertional chest tightness or pressure. He is quite active walking almost every day, and also denies any significant exertional dyspnea.Marland Kitchen. He is no longer taking Lasix as he says his edema is well-controlled with the support stockings. He never got it refilled. About the only symptom he has in his chest is occasional indigestion/gastric improved with belching. No PND, orthopnea or edema.  No palpitations, lightheadedness, dizziness, weakness or syncope/near syncope. No TIA/amaurosis fugax symptoms. No claudication.  Eating more fruits & veggies than before - now BMs are regular & maintaining stable weight  ROS: A comprehensive was performed. Review of Systems  Constitutional: Negative for malaise/fatigue.  HENT: Negative for nosebleeds.   Respiratory:  Negative.   Cardiovascular: Positive for leg swelling (well controlled with support stockings).  Gastrointestinal: Positive for abdominal pain (with Gas) and heartburn. Negative for blood in stool.  Genitourinary: Negative for dysuria and hematuria.  Musculoskeletal: Negative.   Skin: Negative.   Neurological: Negative.   Psychiatric/Behavioral: Negative for memory loss. The patient does not have insomnia. Nervous/anxious: when his wife was in CVA.   All other systems reviewed and are negative.   Past Medical History:  Diagnosis Date  . Back pain, chronic   . Left main coronary artery disease 10/09/2013   Distal Left Main 80-90%; ostial/proximal LAD 50-80%, proximal RI 80%, proximal RCA napkin ring 80-90%; relatively normal Circumflex/Lateral OM --> referred for CABG; 2-D echo March 2015: EF 55-60% with no regional wall motion on his  . Obesity (BMI 30-39.9)   . Paroxysmal a-fib (HCC) 10/09/2013   Admitted with Afib RVR, 2 episodes post-op.  . S/P CABG x 4 10/10/2013   (LIMA to LAD, SVG to OM, SVG to RAMUS INTERMEDIATE, and SVG to RCA  . Unstable angina (HCC) 10/09/2013    Past Surgical History:  Procedure Laterality Date  . CORONARY ARTERY BYPASS GRAFT N/A 10/10/2013   Procedure: CORONARY ARTERY BYPASS GRAFTING (CABG);  Surgeon: Alleen BorneBryan K Bartle, MD;  Location: [facility] OR;  Service: Open Heart Surgery;  Laterality: N/A;  Times 4 using left internal mammary artery and endoscopically harvested left saphenous vein  . DUPUYTREN / PALMAR FASCIOTOMY Right   . HERNIA REPAIR    . INTRAOPERATIVE TRANSESOPHAGEAL ECHOCARDIOGRAM N/A 10/10/2013   Procedure: INTRAOPERATIVE TRANSESOPHAGEAL ECHOCARDIOGRAM;  Surgeon:  Alleen Borne, MD;  Location: [facility] OR;  Service: Open Heart Surgery;  Laterality: N/A;  . LEFT HEART CATHETERIZATION WITH CORONARY ANGIOGRAM N/A 10/09/2013   Procedure: LEFT HEART CATHETERIZATION WITH CORONARY ANGIOGRAM;  Surgeon: Marykay Lex, MD;  Location: [facility] CATH LAB;  Distal Left Main  80-90%; ostial/proximal LAD 50-80%, proximal RI 80%, proximal RCA napkin ring 80-90%; relatively normal Circumflex/Lateral OM --> referred for CABG    Current Meds  Medication Sig  . atorvastatin (LIPITOR) 40 MG tablet TAKE ONE TABLET BY MOUTH ONCE DAILY AT  6PM  . ELIQUIS 5 MG TABS tablet TAKE ONE TABLET BY MOUTH TWICE DAILY  . metoprolol succinate (TOPROL XL) 25 MG 24 hr tablet Take 1 tablet (25 mg total) by mouth daily.  . potassium chloride (K-DUR,KLOR-CON) 10 MEQ tablet TAKE ONE TABLET BY MOUTH ONCE DAILY    No Known Allergies  Social History   Social History  . Marital status: Single    Spouse name: N/A  . Number of children: N/A  . Years of education: N/A   Social History Main Topics  . Smoking status: Never Smoker  . Smokeless tobacco: Never Used  . Alcohol use 0.0 oz/week     Comment: occasional  . Drug use: No  . Sexual activity: Not Asked   Other Topics Concern  . None   Social History Narrative   Married, one son, no grandchildren. He drives for [employer]    family history includes Coronary artery disease (age of onset: 16) in his father; Rectal cancer in his mother; Transient ischemic attack in his mother.  Wt Readings from Last 3 Encounters:  02/04/17 96 kg (211 lb 9.6 oz)  05/17/16 93.4 kg (206 lb)  11/08/15 95 kg (209 lb 8 oz)    PHYSICAL EXAM BP 124/73   Pulse 62   Ht 5\' 8"  (1.727 m)   Wt 96 kg (211 lb 9.6 oz)   BMI 32.17 kg/m  General appearance: alert, cooperative, appears stated age, no distress and mildly obese; quite talkative  HEENT: [place]/AT, EOMI, MMM, anicteric sclera Neck: no adenopathy, no carotid bruit and no JVD  Heart: RRR, S1& S2 normal, no murmur, click, rub or gallop, nondisplaced PMI.  Lungs: clear to auscultation bilaterally, normal percussion bilaterally and non-labored  Abdomen: soft, non-tender; bowel sounds normal; no masses, no organomegaly; mild obesity  Extremities: No C/C with ~1+ LLE edema  Pulses: 2+  and symmetric; normal  Neurologic:Alert, oriented, thought content appropriate; grossly normal; normal mood & affect    Adult ECG Report  Rate: 62 ;  Rhythm: normal sinus rhythm and normal axis, intervals & durations.;   Narrative Interpretation: stable / normal EKG   Other studies Reviewed: Additional studies/ records that were reviewed today include:  Recent Labs:  ? Checked by PCP Lab Results  Component Value Date   CHOL 95 09/28/2014   HDL 41 09/28/2014   LDLCALC 35 09/28/2014   TRIG 96 09/28/2014   CHOLHDL 2.3 09/28/2014    ASSESSMENT / PLAN: Problem List Items Addressed This Visit    Severe multivessel CAD: CABG x4 October 2014 - Primary (Chronic)    Thankfully, no anginal symptoms since his operation. He actually presented with classic anginal symptoms as well as A. fib with RVR. He is on stable dose of Toprol as well as atorvastatin. Not on aspirin because of ELIQUIS. Blood pressures have been relatively low, precluding the use of ACE inhibitor/ARB.      Relevant Orders  EKG 12-Lead (Completed)   Myocardial Perfusion Imaging   PAF (paroxysmal atrial fibrillation), no longer on amiodarone secondary to PFT concerns; CHA2DS2VASC = 3, On Eiquis (Chronic)    Maintaining normal sinus rhythm on Toprol. No recurrent symptoms of A. Fib. Remain stable on ELIQUIS. No bleeding.      S/P CABG x 4 (Chronic)    Remains active with no anginal symptoms. He is now roughly 3-1/2 years out from his CABG. Plan would be to perform first follow-up Myoview prior to his 1 year follow-up visit (this would be roughly 4 years from his CABG)      Hyperlipidemia LDL goal <70 (Chronic)    On 40 mg of Lipitor. My understanding, was in his PCP is following his labs, he has not seen his PCP in quite some time. We will therefore check lipid panel.      Relevant Orders   Lipid panel   Venous insufficiency of left leg    Well-controlled wearing support stockings. No longer using Lasix. Continue  foot elevation.       Other Visit Diagnoses    Medication management       Relevant Orders   Comprehensive metabolic panel      Current medicines are reviewed at length with the patient today. (+/- concerns) n/a The following changes have been made: n/a  Patient Instructions  Your physician recommends that you return for lab work FASTING (CMET, lipid)  Your physician wants you to follow-up in: ONE YEAR with Dr. Herbie Baltimore after exercise myoview (stress test). You will receive a reminder letter in the mail two months in advance. If you don't receive a letter, please call our office to schedule the follow-up appointment.     Studies Ordered:   Orders Placed This Encounter  Procedures  . Comprehensive metabolic panel  . Lipid panel  . Myocardial Perfusion Imaging  . EKG 12-Lead     Bryan Lemma, M.D., M.S. Interventional Cardiologist   Pager # [phone] Phone # [phone] [address] [place], [place] [address]

## 2017-02-05 ENCOUNTER — Encounter: Payer: Self-pay | Admitting: Cardiology

## 2017-02-05 NOTE — Assessment & Plan Note (Signed)
On 40 mg of Lipitor. My understanding, was in his PCP is following his labs, he has not seen his PCP in quite some time. We will therefore check lipid panel.

## 2017-02-05 NOTE — Assessment & Plan Note (Signed)
Well-controlled wearing support stockings. No longer using Lasix. Continue foot elevation.

## 2017-02-05 NOTE — Assessment & Plan Note (Signed)
Maintaining normal sinus rhythm on Toprol. No recurrent symptoms of A. Fib. Remain stable on ELIQUIS. No bleeding.

## 2017-02-05 NOTE — Assessment & Plan Note (Signed)
Thankfully, no anginal symptoms since his operation. He actually presented with classic anginal symptoms as well as A. fib with RVR. He is on stable dose of Toprol as well as atorvastatin. Not on aspirin because of ELIQUIS. Blood pressures have been relatively low, precluding the use of ACE inhibitor/ARB.

## 2017-02-05 NOTE — Assessment & Plan Note (Signed)
Remains active with no anginal symptoms. He is now roughly 3-1/2 years out from his CABG. Plan would be to perform first follow-up Myoview prior to his 1 year follow-up visit (this would be roughly 4 years from his CABG)

## 2017-02-13 ENCOUNTER — Telehealth (HOSPITAL_COMMUNITY): Payer: Self-pay

## 2017-02-13 DIAGNOSIS — Z79899 Other long term (current) drug therapy: Secondary | ICD-10-CM | POA: Diagnosis not present

## 2017-02-13 DIAGNOSIS — E785 Hyperlipidemia, unspecified: Secondary | ICD-10-CM | POA: Diagnosis not present

## 2017-02-13 LAB — COMPREHENSIVE METABOLIC PANEL
ALBUMIN: 4.3 g/dL (ref 3.6–5.1)
ALT: 26 U/L (ref 9–46)
AST: 19 U/L (ref 10–35)
Alkaline Phosphatase: 56 U/L (ref 40–115)
BUN: 13 mg/dL (ref 7–25)
CHLORIDE: 107 mmol/L (ref 98–110)
CO2: 26 mmol/L (ref 20–31)
CREATININE: 1.14 mg/dL (ref 0.70–1.25)
Calcium: 9.1 mg/dL (ref 8.6–10.3)
GLUCOSE: 87 mg/dL (ref 65–99)
Potassium: 4.5 mmol/L (ref 3.5–5.3)
SODIUM: 143 mmol/L (ref 135–146)
Total Bilirubin: 1.1 mg/dL (ref 0.2–1.2)
Total Protein: 6.7 g/dL (ref 6.1–8.1)

## 2017-02-13 LAB — LIPID PANEL
Cholesterol: 84 mg/dL (ref <200)
HDL: 35 mg/dL — ABNORMAL LOW (ref >40)
LDL Cholesterol: 31 mg/dL (ref <100)
Total CHOL/HDL Ratio: 2.4 Ratio (ref <5.0)
Triglycerides: 91 mg/dL (ref <150)
VLDL: 18 mg/dL (ref <30)

## 2017-02-13 NOTE — Telephone Encounter (Signed)
Encounter complete. 

## 2017-02-15 ENCOUNTER — Ambulatory Visit (HOSPITAL_COMMUNITY)
Admission: RE | Admit: 2017-02-15 | Discharge: 2017-02-15 | Disposition: A | Discharge status: Home / Self Care | Payer: PPO | Source: Ambulatory Visit | Attending: Cardiology | Admitting: Cardiology

## 2017-02-15 DIAGNOSIS — E663 Overweight: Secondary | ICD-10-CM | POA: Diagnosis not present

## 2017-02-15 DIAGNOSIS — R9439 Abnormal result of other cardiovascular function study: Secondary | ICD-10-CM | POA: Diagnosis not present

## 2017-02-15 DIAGNOSIS — Z951 Presence of aortocoronary bypass graft: Secondary | ICD-10-CM | POA: Insufficient documentation

## 2017-02-15 DIAGNOSIS — Z6832 Body mass index (BMI) 32.0-32.9, adult: Secondary | ICD-10-CM | POA: Insufficient documentation

## 2017-02-15 DIAGNOSIS — I2510 Atherosclerotic heart disease of native coronary artery without angina pectoris: Secondary | ICD-10-CM | POA: Insufficient documentation

## 2017-02-15 DIAGNOSIS — Z8249 Family history of ischemic heart disease and other diseases of the circulatory system: Secondary | ICD-10-CM | POA: Insufficient documentation

## 2017-02-15 LAB — MYOCARDIAL PERFUSION IMAGING
CHL CUP MPHR: 152 {beats}/min
CHL CUP STRESS STAGE 1 DBP: 77 mmHg
CHL CUP STRESS STAGE 1 SBP: 136 mmHg
CHL CUP STRESS STAGE 1 SPEED: 0 mph
CHL CUP STRESS STAGE 2 GRADE: 0.1 %
CHL CUP STRESS STAGE 3 HR: 89 {beats}/min
CHL CUP STRESS STAGE 3 SBP: 150 mmHg
CHL CUP STRESS STAGE 4 DBP: 69 mmHg
CHL CUP STRESS STAGE 4 GRADE: 12 %
CHL CUP STRESS STAGE 4 SPEED: 2.5 mph
CHL CUP STRESS STAGE 5 HR: 136 {beats}/min
CHL CUP STRESS STAGE 5 SPEED: 3.4 mph
CHL CUP STRESS STAGE 6 DBP: 51 mmHg
CHL CUP STRESS STAGE 6 GRADE: 13.6 %
CHL CUP STRESS STAGE 6 HR: 142 {beats}/min
CHL CUP STRESS STAGE 7 SBP: 145 mmHg
CSEPPMHR: 93 %
Estimated workload: 8.4 METS
Exercise duration (min): 7 min
Exercise duration (sec): 46 s
LV dias vol: 125 mL (ref 62–150)
LV sys vol: 53 mL
NUC STRESS TID: 1.06
Peak BP: 67 mmHg
Peak HR: 142 {beats}/min
Percent HR: 93 %
RPE: 18
Rest HR: 55 {beats}/min
SDS: 2
SRS: 1
SSS: 3
Stage 1 Grade: 0 %
Stage 1 HR: 71 {beats}/min
Stage 2 HR: 71 {beats}/min
Stage 2 Speed: 0 mph
Stage 3 DBP: 68 mmHg
Stage 3 Grade: 10 %
Stage 3 Speed: 1.7 mph
Stage 4 HR: 123 {beats}/min
Stage 4 SBP: 171 mmHg
Stage 5 Grade: 14 %
Stage 6 SBP: 67 mmHg
Stage 6 Speed: 2.8 mph
Stage 7 DBP: 55 mmHg
Stage 7 Grade: 0 %
Stage 7 HR: 114 {beats}/min
Stage 7 Speed: 0 mph
Stage 8 DBP: 66 mmHg
Stage 8 Grade: 0 %
Stage 8 HR: 75 {beats}/min
Stage 8 SBP: 139 mmHg
Stage 8 Speed: 0 mph

## 2017-02-15 MED ORDER — TECHNETIUM TC 99M TETROFOSMIN IV KIT
10.6000 | PACK | Freq: Once | INTRAVENOUS | Status: AC | PRN
  Administered 2017-02-15: 10.6 via INTRAVENOUS
  Filled 2017-02-15: qty 11

## 2017-02-15 MED ORDER — TECHNETIUM TC 99M TETROFOSMIN IV KIT
30.8000 | PACK | Freq: Once | INTRAVENOUS | Status: AC | PRN
  Administered 2017-02-15: 30.8 via INTRAVENOUS
  Filled 2017-02-15: qty 31

## 2017-02-20 NOTE — Progress Notes (Signed)
Lab results: Normal electrolytes, kidney and liver function. Lipid panel looks excellent - total cholesterol 84, HDL 35, LDL 31 and triglycerides 91.  - I think being cut the statin dose in half. Recheck in 6 months.  Bryan Lemmaavid Estela Vinal, MD . Please forward to Dr. Farris HasAaron Morrow

## 2017-02-26 ENCOUNTER — Telehealth: Payer: Self-pay | Admitting: *Deleted

## 2017-02-26 DIAGNOSIS — I2510 Atherosclerotic heart disease of native coronary artery without angina pectoris: Secondary | ICD-10-CM

## 2017-02-26 DIAGNOSIS — Z79899 Other long term (current) drug therapy: Secondary | ICD-10-CM

## 2017-02-26 DIAGNOSIS — E669 Obesity, unspecified: Secondary | ICD-10-CM

## 2017-02-26 DIAGNOSIS — E785 Hyperlipidemia, unspecified: Secondary | ICD-10-CM

## 2017-02-26 NOTE — Telephone Encounter (Signed)
Left message - whether a new prescription is needed;will mail lab slip in 6 months- hepatic ,lipid  any question may call back

## 2017-02-26 NOTE — Telephone Encounter (Signed)
-----   Message from Marykay Lexavid W Harding, MD sent at 02/20/2017  1:37 PM EST ----- Lab results: Normal electrolytes, kidney and liver function. Lipid panel looks excellent - total cholesterol 84, HDL 35, LDL 31 and triglycerides 91.  - I think being cut the statin dose in half. Recheck in 6 months.  Bryan Lemmaavid Harding, MD . Please forward to Dr. Farris HasAaron Morrow

## 2017-03-25 ENCOUNTER — Other Ambulatory Visit: Payer: Self-pay | Admitting: Cardiology

## 2017-04-09 DIAGNOSIS — R972 Elevated prostate specific antigen [PSA]: Secondary | ICD-10-CM | POA: Diagnosis not present

## 2017-04-16 DIAGNOSIS — R972 Elevated prostate specific antigen [PSA]: Secondary | ICD-10-CM | POA: Diagnosis not present

## 2017-05-02 ENCOUNTER — Other Ambulatory Visit: Payer: Self-pay | Admitting: Cardiology

## 2017-07-24 ENCOUNTER — Telehealth: Payer: Self-pay | Admitting: *Deleted

## 2017-07-24 DIAGNOSIS — Z79899 Other long term (current) drug therapy: Secondary | ICD-10-CM

## 2017-07-24 DIAGNOSIS — E669 Obesity, unspecified: Secondary | ICD-10-CM

## 2017-07-24 DIAGNOSIS — E785 Hyperlipidemia, unspecified: Secondary | ICD-10-CM

## 2017-07-24 DIAGNOSIS — I2510 Atherosclerotic heart disease of native coronary artery without angina pectoris: Secondary | ICD-10-CM

## 2017-07-24 NOTE — Telephone Encounter (Signed)
MAIL LETTER AND LAB SLIP TO [facility] DUE BEFORE 08/13/17

## 2017-07-24 NOTE — Telephone Encounter (Signed)
-----   Message from Tobin ChadSharon Messiyah Waterson V, RN sent at 03/07/2017 11:37 AM EDT ----- Need labs rechecked  AUGUST  21,2018  - CMP, LIPID Will mail in @  July 15, 2017  FROM LABS 02/13/17/

## 2017-08-06 ENCOUNTER — Other Ambulatory Visit: Payer: Self-pay | Admitting: Cardiology

## 2017-08-07 DIAGNOSIS — Z79899 Other long term (current) drug therapy: Secondary | ICD-10-CM | POA: Diagnosis not present

## 2017-08-07 DIAGNOSIS — E669 Obesity, unspecified: Secondary | ICD-10-CM | POA: Diagnosis not present

## 2017-08-07 DIAGNOSIS — E785 Hyperlipidemia, unspecified: Secondary | ICD-10-CM | POA: Diagnosis not present

## 2017-08-07 DIAGNOSIS — I2510 Atherosclerotic heart disease of native coronary artery without angina pectoris: Secondary | ICD-10-CM | POA: Diagnosis not present

## 2017-08-08 LAB — LIPID PANEL
Cholesterol: 90 mg/dL (ref <200)
HDL: 31 mg/dL — ABNORMAL LOW (ref >40)
LDL CALC: 43 mg/dL (ref <100)
Total CHOL/HDL Ratio: 2.9 Ratio (ref <5.0)
Triglycerides: 81 mg/dL (ref <150)
VLDL: 16 mg/dL (ref <30)

## 2017-08-08 LAB — HEPATIC FUNCTION PANEL
ALT: 20 U/L (ref 9–46)
AST: 17 U/L (ref 10–35)
Albumin: 4.2 g/dL (ref 3.6–5.1)
Alkaline Phosphatase: 62 U/L (ref 40–115)
Bilirubin, Direct: 0.3 mg/dL — ABNORMAL HIGH (ref <=0.2)
Indirect Bilirubin: 0.9 mg/dL (ref 0.2–1.2)
Total Bilirubin: 1.2 mg/dL (ref 0.2–1.2)
Total Protein: 6.5 g/dL (ref 6.1–8.1)

## 2017-08-15 ENCOUNTER — Telehealth: Payer: Self-pay | Admitting: *Deleted

## 2017-08-15 NOTE — Telephone Encounter (Signed)
-----   Message from Marykay Lex, MD sent at 08/15/2017 11:28 AM EDT ----- Cholesterol panel continues to look great. Liver function also looks great. [facility] was that we were going to reduce his atorvastatin to 20 mg daily. If he has done that, I think we can then simply continue current dose. If not we can reduce to 20 mg now.  Bryan Lemma, MD  Please forward to PCP: Farris Has, MD

## 2017-08-15 NOTE — Telephone Encounter (Signed)
LEFT MESSAGE TO REVIEW MYCHART FOE RESULTS IN QUESTION PLEASE CALL BACK

## 2017-10-28 DIAGNOSIS — R972 Elevated prostate specific antigen [PSA]: Secondary | ICD-10-CM | POA: Diagnosis not present

## 2017-11-01 DIAGNOSIS — R972 Elevated prostate specific antigen [PSA]: Secondary | ICD-10-CM | POA: Diagnosis not present

## 2017-12-27 ENCOUNTER — Telehealth: Payer: Self-pay | Admitting: Cardiology

## 2017-12-27 ENCOUNTER — Telehealth: Payer: Self-pay | Admitting: *Deleted

## 2017-12-27 NOTE — Telephone Encounter (Signed)
error 

## 2017-12-27 NOTE — Telephone Encounter (Signed)
New Message    *STAT* If patient is at the pharmacy, call can be transferred to refill team.   1. Which medications need to be refilled? (please list name of each medication and dose if known) ELIQUIS 5 MG TABS tablet twice a day   2. Which pharmacy/location (including street and city if local pharmacy) is medication to be sent to? [facility] [place]  3. Do they need a 30 day or 90 day supply? 30  Wife is calling on behalf of patient. The patient has only been receving a 15 day supply. Pharmacy tells patient that a new script needs to be sent. Please call.

## 2018-03-03 ENCOUNTER — Other Ambulatory Visit: Payer: Self-pay | Admitting: Cardiology

## 2018-03-30 IMAGING — NM NM MISC PROCEDURE
6 series · 36 of 36 positions shown · non-contrast
Comparison: none

[Series 1: wbr_r-proj_st wbr rest · 6.40mm/px · 6 of 64 frames shown]
[frame 6/64]
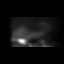
[frame 16/64]
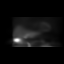
[frame 27/64]
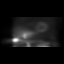
[frame 38/64]
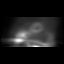
[frame 48/64]
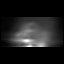
[frame 59/64]
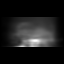

[Series 1: wbr rest · 6.40mm/px · 6 of 64 frames shown]
[frame 6/64]
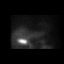
[frame 16/64]
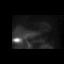
[frame 27/64]
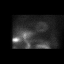
[frame 38/64]
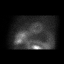
[frame 48/64]
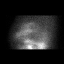
[frame 59/64]
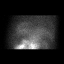

[Series 2: wbr_s-proj_st wbr stress-gsp · 6.40mm/px · 6 of 512 frames shown]
[frame 43/512]
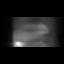
[frame 128/512]
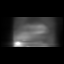
[frame 214/512]
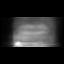
[frame 299/512]
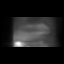
[frame 384/512]
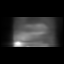
[frame 470/512]
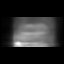

[Series 2: wbr stress-gsp · 6.40mm/px · 6 of 509 frames shown]
[frame 43/509]
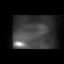
[frame 127/509]
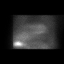
[frame 212/509]
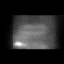
[frame 297/509]
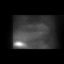
[frame 382/509]
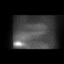
[frame 467/509]
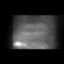

[Series 3: wbr_s-proj_st wbr stress-sum-em · 6.40mm/px · 6 of 64 frames shown]
[frame 6/64]
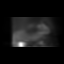
[frame 16/64]
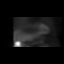
[frame 27/64]
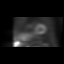
[frame 38/64]
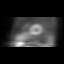
[frame 48/64]
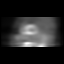
[frame 59/64]
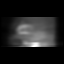

[Series 3: wbr stress-sum-em · 6.40mm/px · 6 of 64 frames shown]
[frame 6/64]
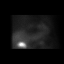
[frame 16/64]
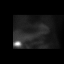
[frame 27/64]
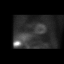
[frame 38/64]
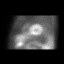
[frame 48/64]
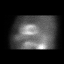
[frame 59/64]
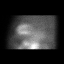

[36 of 36 positions shown; findings below may reference images not displayed]

Canned report from images found in remote index.

Refer to host system for actual result text.

## 2018-05-04 ENCOUNTER — Other Ambulatory Visit: Payer: Self-pay | Admitting: Cardiology

## 2018-05-06 DIAGNOSIS — R972 Elevated prostate specific antigen [PSA]: Secondary | ICD-10-CM | POA: Diagnosis not present

## 2018-05-20 ENCOUNTER — Other Ambulatory Visit: Payer: Self-pay | Admitting: Cardiology

## 2018-05-20 NOTE — Telephone Encounter (Signed)
Rx sent to pharmacy   

## 2018-06-20 ENCOUNTER — Other Ambulatory Visit: Payer: Self-pay | Admitting: Cardiology

## 2018-07-24 ENCOUNTER — Other Ambulatory Visit: Payer: Self-pay | Admitting: Cardiology

## 2018-08-25 ENCOUNTER — Other Ambulatory Visit: Payer: Self-pay | Admitting: Cardiology

## 2018-09-29 ENCOUNTER — Other Ambulatory Visit: Payer: Self-pay | Admitting: Cardiology

## 2018-09-29 NOTE — Telephone Encounter (Signed)
Patient overdue for blood work and MD f/u  Last seen 01/2017.

## 2018-10-28 DIAGNOSIS — R972 Elevated prostate specific antigen [PSA]: Secondary | ICD-10-CM | POA: Diagnosis not present

## 2018-10-30 ENCOUNTER — Other Ambulatory Visit: Payer: Self-pay | Admitting: Cardiology

## 2018-11-04 DIAGNOSIS — R972 Elevated prostate specific antigen [PSA]: Secondary | ICD-10-CM | POA: Diagnosis not present

## 2018-11-07 ENCOUNTER — Encounter: Payer: Self-pay | Admitting: Cardiology

## 2018-11-07 ENCOUNTER — Ambulatory Visit: Payer: PPO | Admitting: Cardiology

## 2018-11-07 VITALS — BP 120/68 | HR 76 | Ht 69.0 in | Wt 212.6 lb

## 2018-11-07 DIAGNOSIS — I483 Typical atrial flutter: Secondary | ICD-10-CM | POA: Diagnosis not present

## 2018-11-07 DIAGNOSIS — I480 Paroxysmal atrial fibrillation: Secondary | ICD-10-CM

## 2018-11-07 DIAGNOSIS — Z7901 Long term (current) use of anticoagulants: Secondary | ICD-10-CM

## 2018-11-07 DIAGNOSIS — R600 Localized edema: Secondary | ICD-10-CM | POA: Diagnosis not present

## 2018-11-07 DIAGNOSIS — I2510 Atherosclerotic heart disease of native coronary artery without angina pectoris: Secondary | ICD-10-CM

## 2018-11-07 DIAGNOSIS — Z01810 Encounter for preprocedural cardiovascular examination: Secondary | ICD-10-CM | POA: Diagnosis not present

## 2018-11-07 DIAGNOSIS — E785 Hyperlipidemia, unspecified: Secondary | ICD-10-CM | POA: Diagnosis not present

## 2018-11-07 MED ORDER — APIXABAN 5 MG PO TABS: 5.0000 mg | ORAL_TABLET | Freq: Two times a day (BID) | ORAL | 3 refills | Status: DC

## 2018-11-07 MED ORDER — AMIODARONE HCL 200 MG PO TABS: ORAL_TABLET | ORAL | 0 refills | Status: DC

## 2018-11-07 NOTE — Assessment & Plan Note (Signed)
Overall doing fairly well. No angina symptoms despite being in atrial flutter today. Remains on low-dose Toprol plus statin. Not on aspirin or Plavix because of Eliquis.

## 2018-11-07 NOTE — Assessment & Plan Note (Signed)
Followed by PCP - have not seen labs in some time on Atorvastatin --> will check lipids on Monday AM (11/18).

## 2018-11-07 NOTE — H&P (View-Only) (Signed)
PCP: [name], [name], MD  Clinic Note: Chief Complaint  Patient presents with  . Follow-up  . Pre-op Exam  . Coronary Artery Disease  . Atrial Fibrillation    HPI: Alfred Thompson is a 72 y.o. male with a PMH notable for CAD and CABG as well as PAF who presents today for delayed follow-up to discuss holding Eliquis for prostate biopsy.   October 2014 possibly angina: Cath showed left main disease --> CABG x4  Complicated by A. fib RVR.  Initially on amiodarone and Eliquis.  Amiodarone was stopped due to DLCO elevation.  Alfred Thompson was last seen on 03/12/2017 -was doing well no cardiac standpoint.  No chest pain or pressure.  No sensation of A. fib.  No bleeding.  Trying to eat more fruits and vegetables to normalize bowel movements and maintain stable weight.  Mild edema controlled with support stockings.  Recent Hospitalizations: none  Studies Personally Reviewed - (if available, images/films reviewed: From Epic Chart or Care Everywhere)  Myoview Sress Test Feb 2018: : EF 55-65%.  Subtle mild perfusion defect and apical lateral wall.  No reversibility.  Cannot exclude diaphragmatic attenuation.  LOW RISK.  Interval History: Alfred Thompson returns today feeling fine from a cardiac standpoint.  No recurrent angina symptoms & no sensation of rapid/irregular HR.  No sense of Afib/flutter (although he is currently in A Flutter).  Now wearing support socks - no longer compression stockings & edema is well.     No chest pain or shortness of breath with rest or exertion. No PND, or orthopnea. No lightheadedness, dizziness, weakness or syncope/near syncope. No TIA/amaurosis fugax symptoms. No melena, hematochezia, hematuria, or epstaxis. No claudication.  Urologist that notes more prominent increase in PSA - would like to do a biopsy -- will need to hold Eliquis.  ROS: A comprehensive was performed. Review of Systems  Constitutional: Negative for malaise/fatigue and weight loss.  HENT:  Negative for congestion and nosebleeds.   Respiratory: Negative for cough, shortness of breath and wheezing.   Musculoskeletal: Negative for back pain and joint pain.  Neurological: Negative for dizziness.  Psychiatric/Behavioral: The patient is not nervous/anxious.   All other systems reviewed and are negative.   I have reviewed and (if needed) personally updated the patient's problem list, medications, allergies, past medical and surgical history, social and family history.   Past Medical History:  Diagnosis Date  . Back pain, chronic   . Left main coronary artery disease 10/09/2013   Distal Left Main 80-90%; ostial/proximal LAD 50-80%, proximal RI 80%, proximal RCA napkin ring 80-90%; relatively normal Circumflex/Lateral OM --> referred for CABG; 2-D echo March 2015: EF 55-60% with no regional wall motion on his  . Obesity (BMI 30-39.9)   . Paroxysmal A-fib (HCC) 10/09/2013   Admitted with Afib RVR, 2 episodes post-op.  . S/P CABG x 4 10/10/2013   (LIMA to LAD, SVG to OM, SVG to RAMUS INTERMEDIATE, and SVG to RCA  . Unstable angina (HCC) 10/09/2013    Past Surgical History:  Procedure Laterality Date  . CORONARY ARTERY BYPASS GRAFT N/A 10/10/2013   Procedure: CORONARY ARTERY BYPASS GRAFTING (CABG);  Surgeon: Alleen BorneBryan K Bartle, MD;  [facility];  Service: Open Heart Surgery;  Laterality: N/A;  Times 4 using left internal mammary artery and endoscopically harvested left saphenous vein  . DUPUYTREN / PALMAR FASCIOTOMY Right   . HERNIA REPAIR    . INTRAOPERATIVE TRANSESOPHAGEAL ECHOCARDIOGRAM N/A 10/10/2013   Procedure: INTRAOPERATIVE TRANSESOPHAGEAL ECHOCARDIOGRAM;  Surgeon:  Alleen Borne, MD;  Location: [facility] OR;  Service: Open Heart Surgery;  Laterality: N/A;  . LEFT HEART CATHETERIZATION WITH CORONARY ANGIOGRAM N/A 10/09/2013   Procedure: LEFT HEART CATHETERIZATION WITH CORONARY ANGIOGRAM;  Surgeon: Marykay Lex, MD;  Location: [facility] CATH LAB;  Distal Left Main 80-90%;  ostial/proximal LAD 50-80%, proximal RI 80%, proximal RCA napkin ring 80-90%; relatively normal Circumflex/Lateral OM --> referred for CABG  . NM MYOVIEW LTD  01/2017   EF 55-65%.  Subtle mild perfusion defect and apical lateral wall.  No reversibility.  Cannot exclude diaphragmatic attenuation.  LOW RISK.    Current Meds  Medication Sig  . apixaban (ELIQUIS) 5 MG TABS tablet Take 1 tablet twice daily. Will not refill without scheduled appt.  Marland Kitchen atorvastatin (LIPITOR) 40 MG tablet TAKE 1 TABLET BY MOUTH ONCE DAILY AT  6  PM (Patient taking differently: Take 20 mg by mouth every evening. )  . KLOR-CON M10 10 MEQ tablet TAKE 1 TABLET BY MOUTH ONCE DAILY  . metoprolol succinate (TOPROL-XL) 25 MG 24 hr tablet TAKE 1 TABLET BY MOUTH ONCE DAILY    No Known Allergies  Social History   Tobacco Use  . Smoking status: Never Smoker  . Smokeless tobacco: Never Used  Substance Use Topics  . Alcohol use: Yes    Alcohol/week: 0.0 standard drinks    Comment: occasional  . Drug use: No   Social History   Social History Narrative   Married, one son, no grandchildren. He drives for [employer]    family history includes Coronary artery disease (age of onset: 66) in his father; Rectal cancer in his mother; Transient ischemic attack in his mother.  Wt Readings from Last 3 Encounters:  11/07/18 212 lb 9.6 oz (96.4 kg)  02/15/17 211 lb (95.7 kg)  02/04/17 211 lb 9.6 oz (96 kg)    PHYSICAL EXAM BP 120/68   Pulse 76   Ht 5\' 9"  (1.753 m)   Wt 212 lb 9.6 oz (96.4 kg)   BMI 31.40 kg/m  Physical Exam  Constitutional: He is oriented to person, place, and time. He appears well-developed and well-nourished. No distress.  Healthy-appearing.  Well-groomed  HENT:  Head: Normocephalic and atraumatic.  Neck: Normal range of motion. Neck supple. No hepatojugular reflux and no JVD present. Carotid bruit is not present.  Cardiovascular: Normal rate, regular rhythm, normal heart sounds and intact  distal pulses.  No extrasystoles are present. PMI is not displaced. Exam reveals no gallop and no friction rub.  No murmur heard. Pulmonary/Chest: Effort normal and breath sounds normal. No respiratory distress. He has no wheezes.  Abdominal: Soft. Bowel sounds are normal. He exhibits no distension. There is no tenderness.  Musculoskeletal: Normal range of motion. He exhibits edema (trivial LLE).  Neurological: He is alert and oriented to person, place, and time.  Psychiatric: He has a normal mood and affect. His behavior is normal. Judgment and thought content normal.  Vitals reviewed.   Adult ECG Report  Rate: 76 ;  Rhythm: atrial flutter and AV block.  Nonspecific ST-T wave changes.; T wave inversions still noted in precordial leads.  (V1-V3)  Narrative Interpretation: Last EKG showed sinus rhythm  Other studies Reviewed: Additional studies/ records that were reviewed today include:  Recent Labs:   Lab Results  Component Value Date   CHOL 90 08/07/2017   HDL 31 (L) 08/07/2017   LDLCALC 43 08/07/2017   TRIG 81 08/07/2017   CHOLHDL 2.9 08/07/2017  Lab Results  Component Value Date   CREATININE 1.14 02/13/2017   BUN 13 02/13/2017   NA 143 02/13/2017   K 4.5 02/13/2017   CL 107 02/13/2017   CO2 26 02/13/2017   ASSESSMENT / PLAN: Problem List Items Addressed This Visit    Chronic anticoagulation, for PAF with eliquis (Chronic)    No bleeding complications - will need to hold ~2-3 d pre-pop for prostate Bx.    With him actually being in A Flutter today, would like to see him in NSR before holding for procedure --> if able to restore NSR (chemically vs. Electrically by mid week next week --> will have him return in ~4 wks to reassess rhythm.  If in NSR - OK to hold Eliquis for procedure (will cover with Amiodarone during those 3 days to maintain NSR).      Edema of left lower extremity   Hyperlipidemia LDL goal <70 (Chronic)    Followed by PCP - have not seen labs in some time  on Atorvastatin --> will check lipids on Monday AM (11/18).      PAF (paroxysmal atrial fibrillation), no longer on amiodarone secondary to PFT concerns; CHA2DS2VASC = 3, On Eiquis (Chronic)    Currently he is actually in atrial flutter.  No symptoms.  Is on Eliquis for anticoagulation and metoprolol for rate control. I think he is probably more symptomatic with A. fib than atrial flutter      Relevant Orders   EKG 12-Lead   Preop cardiovascular exam    Pretty much the most part stable from a cardiac point.  No active angina or heart failure symptoms.  Concern is graft which holding Eliquis for 3 days.  Mechanistically, and only need to be held for 2days which would probably be preferable. I have just been conservative actually being in atrial flutter today.  Would like to see him in sinus rhythm prior to holding his anticoagulation. Plan for now is to reassess next week after attempting chemical cardioversion.  Otherwise, if he remains in flutter, he will go cardioversion in the next day or so after.  I will have him return to be seen by nurse petitioner PA in 4 weeks.  If maintaining sinus rhythm would be okay to go forward with biopsy Eliquis.  He will take amiodarone 400 mg daily on the days he is holding Eliquis.      Severe multivessel CAD: CABG x4 October 2014 (Chronic)    Overall doing fairly well. No angina symptoms despite being in atrial flutter today. Remains on low-dose Toprol plus statin. Not on aspirin or Plavix because of Eliquis.      Relevant Orders   EKG 12-Lead   Typical atrial flutter (HCC) - Primary    This is actually the first time seeing him in atrial flutter.  Hopefully this will be to cardiovert either chemically or electrically. Likely to be in sinus rhythm first procedure so he feels safe for stopping his Eliquis. Plan: We will attempt chemical cardioversion over the weekend with 400 mg twice daily.  He will come back in on Monday for EKG.  If he is in sinus  rhythm, can simply continue beta-blocker plus Eliquis until reevaluation in 4 weeks.  If maintaining sinus rhythm at that time, will be okay to hold Eliquis for his procedure cover for those 3 days 40 mg daily amiodarone.   Would not continue amiodarone long-term however because of DLCO concerns.         I  spent a total of 30 minutes with the patient and chart review. >  50% of the time was spent in direct patient consultation.   Current medicines are reviewed at length with the patient today.  (+/- concerns) = will need to stop Eliquis for prostate biopsy.  The following changes have been made:  see below.  There are no Patient Instructions on file for this visit.   Studies Ordered:   Orders Placed This Encounter  Procedures  . EKG 12-Lead      Bryan Lemma, M.D., M.S. Interventional Cardiologist   Pager # [phone] Phone # [phone] [address] [place], [place] [address]   Thank you for choosing Heart[facility] at [facility]!!

## 2018-11-07 NOTE — Assessment & Plan Note (Signed)
Currently he is actually in atrial flutter.  No symptoms.  Is on Eliquis for anticoagulation and metoprolol for rate control. I think he is probably more symptomatic with A. fib than atrial flutter

## 2018-11-07 NOTE — Patient Instructions (Signed)
Medication Instructions:   STARTING AMIODARONE 400 mg twice a day --Today , Saturday, and Sunday - COME TO OFFICE ON Monday FOR EKG.    -INSTRUCTION FOR WHEN YOU HAVE  BIOPSY-The days - that you are not taking ELIQUIS FOR PROSTATE BIOPSY - TAKE AMIODARONE 400 MG DAILY , UNTIL RESTART  ELIQUIS  If you need a refill on your cardiac medications before your next appointment, please call your pharmacy.   Lab work: LIPID ,CMP CBC ON MONDAY If you have labs (blood work) drawn today and your tests are completely normal, you will receive your results only by: Marland Kitchen. MyChart Message (if you have MyChart) OR . A paper copy in the mail If you have any lab test that is abnormal or we need to change your treatment, we will call you to review the results.  Testing/Procedures: SCHEDULE FOR NOV 22,2019 IF STILL IN AFIB/AFLUTTER- [facility] Your physician has recommended that you have a Cardioversion (DCCV). Electrical Cardioversion uses a jolt of electricity to your heart either through paddles or wired patches attached to your chest. This is a controlled, usually prescheduled, procedure. Defibrillation is done under light anesthesia in the hospital, and you usually go home the day of the procedure. This is done to get your heart back into a normal rhythm. You are not awake for the procedure. Please see the instruction sheet given to you today.     Follow-Up: At [facility], you and your health needs are our priority.  As part of our continuing mission to provide you with exceptional heart care, we have created designated Provider Care Teams.  These Care Teams include your primary Cardiologist (physician) and Advanced Practice Providers (APPs -  Physician Assistants and Nurse Practitioners) who all work together to provide you with the care you need, when you need it.   Your physician recommends that you schedule a follow-up appointment in WEEK OF DEC 16,2019 WITH EXTENDER-    You are scheduled for  a TEE/Cardioversion/TEE Cardioversion on  8:15 AM with Dr. Tenny CrawOSS.  [facility]: [address] [place], [place] [address] at  6:45 AM. (1 hour prior to procedure unless lab work is needed; if lab work is needed arrive 1.5 hours ahead)  DIET: Nothing to eat or drink after midnight except a sip of water with medications (see medication instructions below)  Medication Instructions:  Continue your anticoagulant: ELIQUIS You will need to continue your anticoagulant after your procedure until you  are told by your  Provider that it is safe to stop   Labs:  WILL BE DONE Monday NOV 18,2019    You must have a responsible person to drive you home and stay in the waiting area during your procedure. Failure to do so could result in cancellation.  Bring your insurance cards.  *Special Note: Every effort is made to have your procedure done on time. Occasionally there are emergencies that occur at the hospital that may cause delays. Please be patient if a delay does occur.

## 2018-11-07 NOTE — Assessment & Plan Note (Signed)
No bleeding complications - will need to hold ~2-3 d pre-pop for prostate Bx.    With him actually being in A Flutter today, would like to see him in NSR before holding for procedure --> if able to restore NSR (chemically vs. Electrically by mid week next week --> will have him return in ~4 wks to reassess rhythm.  If in NSR - OK to hold Eliquis for procedure (will cover with Amiodarone during those 3 days to maintain NSR).

## 2018-11-07 NOTE — Progress Notes (Signed)
 PCP: Morrow, [name], MD  Clinic Note: Chief Complaint  Patient presents with  . Follow-up  . Pre-op Exam  . Coronary Artery Disease  . Atrial Fibrillation    HPI: Alfred Thompson is a 72 y.o. male with a PMH notable for CAD and CABG as well as PAF who presents today for delayed follow-up to discuss holding Eliquis for prostate biopsy.   October 2014 possibly angina: Cath showed left main disease --> CABG x4  Complicated by A. fib RVR.  Initially on amiodarone and Eliquis.  Amiodarone was stopped due to DLCO elevation.  Cashius C Dosh was last seen on 03/12/2017 -was doing well no cardiac standpoint.  No chest pain or pressure.  No sensation of A. fib.  No bleeding.  Trying to eat more fruits and vegetables to normalize bowel movements and maintain stable weight.  Mild edema controlled with support stockings.  Recent Hospitalizations: none  Studies Personally Reviewed - (if available, images/films reviewed: From Epic Chart or Care Everywhere)  Myoview Sress Test Feb 2018: : EF 55-65%.  Subtle mild perfusion defect and apical lateral wall.  No reversibility.  Cannot exclude diaphragmatic attenuation.  LOW RISK.  Interval History: Alfred Thompson returns today feeling fine from a cardiac standpoint.  No recurrent angina symptoms & no sensation of rapid/irregular HR.  No sense of Afib/flutter (although he is currently in A Flutter).  Now wearing support socks - no longer compression stockings & edema is well.     No chest pain or shortness of breath with rest or exertion. No PND, or orthopnea. No lightheadedness, dizziness, weakness or syncope/near syncope. No TIA/amaurosis fugax symptoms. No melena, hematochezia, hematuria, or epstaxis. No claudication.  Urologist that notes more prominent increase in PSA - would like to do a biopsy -- will need to hold Eliquis.  ROS: A comprehensive was performed. Review of Systems  Constitutional: Negative for malaise/fatigue and weight loss.  HENT:  Negative for congestion and nosebleeds.   Respiratory: Negative for cough, shortness of breath and wheezing.   Musculoskeletal: Negative for back pain and joint pain.  Neurological: Negative for dizziness.  Psychiatric/Behavioral: The patient is not nervous/anxious.   All other systems reviewed and are negative.   I have reviewed and (if needed) personally updated the patient's problem list, medications, allergies, past medical and surgical history, social and family history.   Past Medical History:  Diagnosis Date  . Back pain, chronic   . Left main coronary artery disease 10/09/2013   Distal Left Main 80-90%; ostial/proximal LAD 50-80%, proximal RI 80%, proximal RCA napkin ring 80-90%; relatively normal Circumflex/Lateral OM --> referred for CABG; 2-D echo March 2015: EF 55-60% with no regional wall motion on his  . Obesity (BMI 30-39.9)   . Paroxysmal A-fib (HCC) 10/09/2013   Admitted with Afib RVR, 2 episodes post-op.  . S/P CABG x 4 10/10/2013   (LIMA to LAD, SVG to OM, SVG to RAMUS INTERMEDIATE, and SVG to RCA  . Unstable angina (HCC) 10/09/2013    Past Surgical History:  Procedure Laterality Date  . CORONARY ARTERY BYPASS GRAFT N/A 10/10/2013   Procedure: CORONARY ARTERY BYPASS GRAFTING (CABG);  Surgeon: Bryan K Bartle, MD;  Location: [facility] OR;  Service: Open Heart Surgery;  Laterality: N/A;  Times 4 using left internal mammary artery and endoscopically harvested left saphenous vein  . DUPUYTREN / PALMAR FASCIOTOMY Right   . HERNIA REPAIR    . INTRAOPERATIVE TRANSESOPHAGEAL ECHOCARDIOGRAM N/A 10/10/2013   Procedure: INTRAOPERATIVE TRANSESOPHAGEAL ECHOCARDIOGRAM;  Surgeon:   Bryan K Bartle, MD;  Location: [facility] OR;  Service: Open Heart Surgery;  Laterality: N/A;  . LEFT HEART CATHETERIZATION WITH CORONARY ANGIOGRAM N/A 10/09/2013   Procedure: LEFT HEART CATHETERIZATION WITH CORONARY ANGIOGRAM;  Surgeon:  W , MD;  Location: [facility] CATH LAB;  Distal Left Main 80-90%;  ostial/proximal LAD 50-80%, proximal RI 80%, proximal RCA napkin ring 80-90%; relatively normal Circumflex/Lateral OM --> referred for CABG  . NM MYOVIEW LTD  01/2017   EF 55-65%.  Subtle mild perfusion defect and apical lateral wall.  No reversibility.  Cannot exclude diaphragmatic attenuation.  LOW RISK.    Current Meds  Medication Sig  . apixaban (ELIQUIS) 5 MG TABS tablet Take 1 tablet twice daily. Will not refill without scheduled appt.  . atorvastatin (LIPITOR) 40 MG tablet TAKE 1 TABLET BY MOUTH ONCE DAILY AT  6  PM (Patient taking differently: Take 20 mg by mouth every evening. )  . KLOR-CON M10 10 MEQ tablet TAKE 1 TABLET BY MOUTH ONCE DAILY  . metoprolol succinate (TOPROL-XL) 25 MG 24 hr tablet TAKE 1 TABLET BY MOUTH ONCE DAILY    No Known Allergies  Social History   Tobacco Use  . Smoking status: Never Smoker  . Smokeless tobacco: Never Used  Substance Use Topics  . Alcohol use: Yes    Alcohol/week: 0.0 standard drinks    Comment: occasional  . Drug use: No   Social History   Social History Narrative   Married, one son, no grandchildren. He drives for [employer]    family history includes Coronary artery disease (age of onset: 70) in his father; Rectal cancer in his mother; Transient ischemic attack in his mother.  Wt Readings from Last 3 Encounters:  11/07/18 212 lb 9.6 oz (96.4 kg)  02/15/17 211 lb (95.7 kg)  02/04/17 211 lb 9.6 oz (96 kg)    PHYSICAL EXAM BP 120/68   Pulse 76   Ht 5' 9" (1.753 m)   Wt 212 lb 9.6 oz (96.4 kg)   BMI 31.40 kg/m  Physical Exam  Constitutional: He is oriented to person, place, and time. He appears well-developed and well-nourished. No distress.  Healthy-appearing.  Well-groomed  HENT:  Head: Normocephalic and atraumatic.  Neck: Normal range of motion. Neck supple. No hepatojugular reflux and no JVD present. Carotid bruit is not present.  Cardiovascular: Normal rate, regular rhythm, normal heart sounds and intact  distal pulses.  No extrasystoles are present. PMI is not displaced. Exam reveals no gallop and no friction rub.  No murmur heard. Pulmonary/Chest: Effort normal and breath sounds normal. No respiratory distress. He has no wheezes.  Abdominal: Soft. Bowel sounds are normal. He exhibits no distension. There is no tenderness.  Musculoskeletal: Normal range of motion. He exhibits edema (trivial LLE).  Neurological: He is alert and oriented to person, place, and time.  Psychiatric: He has a normal mood and affect. His behavior is normal. Judgment and thought content normal.  Vitals reviewed.   Adult ECG Report  Rate: 76 ;  Rhythm: atrial flutter and AV block.  Nonspecific ST-T wave changes.; T wave inversions still noted in precordial leads.  (V1-V3)  Narrative Interpretation: Last EKG showed sinus rhythm  Other studies Reviewed: Additional studies/ records that were reviewed today include:  Recent Labs:   Lab Results  Component Value Date   CHOL 90 08/07/2017   HDL 31 (L) 08/07/2017   LDLCALC 43 08/07/2017   TRIG 81 08/07/2017   CHOLHDL 2.9 08/07/2017     Lab Results  Component Value Date   CREATININE 1.14 02/13/2017   BUN 13 02/13/2017   NA 143 02/13/2017   K 4.5 02/13/2017   CL 107 02/13/2017   CO2 26 02/13/2017   ASSESSMENT / PLAN: Problem List Items Addressed This Visit    Chronic anticoagulation, for PAF with eliquis (Chronic)    No bleeding complications - will need to hold ~2-3 d pre-pop for prostate Bx.    With him actually being in A Flutter today, would like to see him in NSR before holding for procedure --> if able to restore NSR (chemically vs. Electrically by mid week next week --> will have him return in ~4 wks to reassess rhythm.  If in NSR - OK to hold Eliquis for procedure (will cover with Amiodarone during those 3 days to maintain NSR).      Edema of left lower extremity   Hyperlipidemia LDL goal <70 (Chronic)    Followed by PCP - have not seen labs in some time  on Atorvastatin --> will check lipids on Monday AM (11/18).      PAF (paroxysmal atrial fibrillation), no longer on amiodarone secondary to PFT concerns; CHA2DS2VASC = 3, On Eiquis (Chronic)    Currently he is actually in atrial flutter.  No symptoms.  Is on Eliquis for anticoagulation and metoprolol for rate control. I think he is probably more symptomatic with A. fib than atrial flutter      Relevant Orders   EKG 12-Lead   Preop cardiovascular exam    Pretty much the most part stable from a cardiac point.  No active angina or heart failure symptoms.  Concern is graft which holding Eliquis for 3 days.  Mechanistically, and only need to be held for 2days which would probably be preferable. I have just been conservative actually being in atrial flutter today.  Would like to see him in sinus rhythm prior to holding his anticoagulation. Plan for now is to reassess next week after attempting chemical cardioversion.  Otherwise, if he remains in flutter, he will go cardioversion in the next day or so after.  I will have him return to be seen by nurse petitioner PA in 4 weeks.  If maintaining sinus rhythm would be okay to go forward with biopsy Eliquis.  He will take amiodarone 400 mg daily on the days he is holding Eliquis.      Severe multivessel CAD: CABG x4 October 2014 (Chronic)    Overall doing fairly well. No angina symptoms despite being in atrial flutter today. Remains on low-dose Toprol plus statin. Not on aspirin or Plavix because of Eliquis.      Relevant Orders   EKG 12-Lead   Typical atrial flutter (HCC) - Primary    This is actually the first time seeing him in atrial flutter.  Hopefully this will be to cardiovert either chemically or electrically. Likely to be in sinus rhythm first procedure so he feels safe for stopping his Eliquis. Plan: We will attempt chemical cardioversion over the weekend with 400 mg twice daily.  He will come back in on Monday for EKG.  If he is in sinus  rhythm, can simply continue beta-blocker plus Eliquis until reevaluation in 4 weeks.  If maintaining sinus rhythm at that time, will be okay to hold Eliquis for his procedure cover for those 3 days 40 mg daily amiodarone.   Would not continue amiodarone long-term however because of DLCO concerns.         I   spent a total of 30 minutes with the patient and chart review. >  50% of the time was spent in direct patient consultation.   Current medicines are reviewed at length with the patient today.  (+/- concerns) = will need to stop Eliquis for prostate biopsy.  The following changes have been made:  see below.  There are no Patient Instructions on file for this visit.   Studies Ordered:   Orders Placed This Encounter  Procedures  . EKG 12-Lead       , M.D., M.S. Interventional Cardiologist   Pager # [phone] Phone # [phone] [address] [place], [place] [address]   Thank you for choosing [facility]!!    

## 2018-11-07 NOTE — Assessment & Plan Note (Signed)
Pretty much the most part stable from a cardiac point.  No active angina or heart failure symptoms.  Concern is graft which holding Eliquis for 3 days.  Mechanistically, and only need to be held for 2days which would probably be preferable. I have just been conservative actually being in atrial flutter today.  Would like to see him in sinus rhythm prior to holding his anticoagulation. Plan for now is to reassess next week after attempting chemical cardioversion.  Otherwise, if he remains in flutter, he will go cardioversion in the next day or so after.  I will have him return to be seen by nurse petitioner PA in 4 weeks.  If maintaining sinus rhythm would be okay to go forward with biopsy Eliquis.  He will take amiodarone 400 mg daily on the days he is holding Eliquis.

## 2018-11-07 NOTE — Assessment & Plan Note (Signed)
This is actually the first time seeing him in atrial flutter.  Hopefully this will be to cardiovert either chemically or electrically. Likely to be in sinus rhythm first procedure so he feels safe for stopping his Eliquis. Plan: We will attempt chemical cardioversion over the weekend with 400 mg twice daily.  He will come back in on Monday for EKG.  If he is in sinus rhythm, can simply continue beta-blocker plus Eliquis until reevaluation in 4 weeks.  If maintaining sinus rhythm at that time, will be okay to hold Eliquis for his procedure cover for those 3 days 40 mg daily amiodarone.   Would not continue amiodarone long-term however because of DLCO concerns.

## 2018-11-10 ENCOUNTER — Telehealth: Payer: Self-pay | Admitting: Cardiology

## 2018-11-10 ENCOUNTER — Ambulatory Visit (INDEPENDENT_AMBULATORY_CARE_PROVIDER_SITE_OTHER): Payer: PPO | Admitting: *Deleted

## 2018-11-10 VITALS — BP 120/80 | HR 88

## 2018-11-10 DIAGNOSIS — Z01810 Encounter for preprocedural cardiovascular examination: Secondary | ICD-10-CM | POA: Diagnosis not present

## 2018-11-10 DIAGNOSIS — Z7901 Long term (current) use of anticoagulants: Secondary | ICD-10-CM | POA: Diagnosis not present

## 2018-11-10 DIAGNOSIS — E785 Hyperlipidemia, unspecified: Secondary | ICD-10-CM | POA: Diagnosis not present

## 2018-11-10 DIAGNOSIS — I483 Typical atrial flutter: Secondary | ICD-10-CM

## 2018-11-10 LAB — COMPREHENSIVE METABOLIC PANEL
ALBUMIN: 4.6 g/dL (ref 3.5–4.8)
ALK PHOS: 69 IU/L (ref 39–117)
ALT: 28 IU/L (ref 0–44)
AST: 20 IU/L (ref 0–40)
Albumin/Globulin Ratio: 2.2 (ref 1.2–2.2)
BUN / CREAT RATIO: 9 — AB (ref 10–24)
BUN: 11 mg/dL (ref 8–27)
Bilirubin Total: 1.3 mg/dL — ABNORMAL HIGH (ref 0.0–1.2)
CHLORIDE: 102 mmol/L (ref 96–106)
CO2: 24 mmol/L (ref 20–29)
Calcium: 9.2 mg/dL (ref 8.6–10.2)
Creatinine, Ser: 1.27 mg/dL (ref 0.76–1.27)
GFR calc Af Amer: 66 mL/min/{1.73_m2} (ref >59)
GFR calc non Af Amer: 57 mL/min/{1.73_m2} — ABNORMAL LOW (ref >59)
GLOBULIN, TOTAL: 2.1 g/dL (ref 1.5–4.5)
Glucose: 94 mg/dL (ref 65–99)
Potassium: 4.3 mmol/L (ref 3.5–5.2)
SODIUM: 141 mmol/L (ref 134–144)
Total Protein: 6.7 g/dL (ref 6.0–8.5)

## 2018-11-10 LAB — LIPID PANEL
Chol/HDL Ratio: 2.5 ratio (ref 0.0–5.0)
Cholesterol, Total: 94 mg/dL — ABNORMAL LOW (ref 100–199)
HDL: 38 mg/dL — AB (ref >39)
LDL Calculated: 42 mg/dL (ref 0–99)
TRIGLYCERIDES: 69 mg/dL (ref 0–149)
VLDL CHOLESTEROL CAL: 14 mg/dL (ref 5–40)

## 2018-11-10 LAB — CBC
Hematocrit: 45.3 % (ref 37.5–51.0)
Hemoglobin: 15.5 g/dL (ref 13.0–17.7)
MCH: 33 pg (ref 26.6–33.0)
MCHC: 34.2 g/dL (ref 31.5–35.7)
MCV: 97 fL (ref 79–97)
PLATELETS: 190 10*3/uL (ref 150–450)
RBC: 4.69 x10E6/uL (ref 4.14–5.80)
RDW: 13.2 % (ref 12.3–15.4)
WBC: 9 10*3/uL (ref 3.4–10.8)

## 2018-11-10 NOTE — Patient Instructions (Signed)
Medication Instructions:   TAKE AMIODARONE  400 MG  ( 2 TABLETS) ONCE DAILY UNTIL  CARDIOVERSION  ON NOV 22,2019 If you need a refill on your cardiac medications before your next appointment, please call your pharmacy.   Lab work:  do labs today as planneded If you have labs (blood work) drawn today and your tests are completely normal, you will receive your results only by: Marland Kitchen. MyChart Message (if you have MyChart) OR . A paper copy in the mail If you have any lab test that is abnormal or we need to change your treatment, we will call you to review the results.  Testing/Procedures:  KEEP  CARDIOVERSION SCHEDULE FOR NOV 22,2019  Follow-Up: At [facility], you and your health needs are our priority.  As part of our continuing mission to provide you with exceptional heart care, we have created designated Provider Care Teams.  These Care Teams include your primary Cardiologist (physician) and Advanced Practice Providers (APPs -  Physician Assistants and Nurse Practitioners) who all work together to provide you with the care you need, when you need it. .   Any Other Special Instructions Will Be Listed Below (If Applicable).

## 2018-11-10 NOTE — Telephone Encounter (Signed)
° ° °  ° °  [facility] Pre-operative Risk Assessment    Request for surgical clearance:  1. What type of surgery is being performed? Prostate Biopsy  2. When is this surgery scheduled? TBD  3. What type of clearance is required (medical clearance vs. Pharmacy clearance to hold med vs. Both)? both  4. Are there any medications that need to be held prior to surgery and how long? Eliquis 3 days  5. Practice name and name of physician performing surgery? [facility]  6. What is your office phone number [phone] ext [phone]   7.   What is your office fax number [phone]  8.   Anesthesia type (None, local, MAC, general) ? general   Alfred Thompson 11/10/2018, 10:38 AM  _________________________________________________________________   (provider comments below)

## 2018-11-10 NOTE — Progress Notes (Signed)
1.) Reason for visit: EKG ONLY   2.) Name of MD requesting visit:  DR HARDING  3.) H&P:  N/A  4.) ROS related to problem:PATIENT SATES HE HAS TAKEN AMIODARONE AS PRESCRIBED FOR LAST 3 DAYS. HERE TODAY FOR EKG  TO SEE IF HE HAS CONVERTED TO REGULAR RHYTHM,  5.) Assessment and plan per MD-- EKGA ND BLOOD PRESURE OBTAINED BY RN  EKG  REVIEWED BY  D.O.D DR CRENSHAW -- EKG SHOWED  ATRIAL FLUTTER RATE 88. RN NOTIFIED DR HARDING  OF EKG . PER DR HARDING CONTINUE WITH AMIODARONE 400 MG  DAILY UNTIL CARDIOVERSION.  PATIENT IS AWARE .  MEDICATION DIRECTION WERE NOT CHANGED ON  MEDICATION LIST. PATIENT VERBALIZED UNDERSTANDING. AFTER SUMMARY GIVEN TO PATIENT

## 2018-11-11 NOTE — Telephone Encounter (Signed)
Clearance letter faxed to [facility].

## 2018-11-11 NOTE — Telephone Encounter (Signed)
This was addressed 4 days ago by Dr. Herbie BaltimoreHarding. Please forward Dr. Elissa HeftyHarding's note to [facility]. Current plan is outpatient cardioversion on 11/14/2018, after which, patient will need uninterrupted eliquis for 4 days. Therefore, the earliest date for the patient to hold eliquis is 12/13/2018. Dr. Herbie BaltimoreHarding scheduled the patient to be seen by Corine ShelterLuke Kilroy on 12/09/2018 to recheck for recurrent atrial flutter and potentially clear the patient for biopsy.   However if biopsy is urgent and needs to be done as soon as possible, please let us know. Will [facility] to [facility]-C since he will see the patient next month.   Staff to forward this phone note and last office note to [facility].   Ramond DialSigned, Erina Hamme PA Pager: [phone]

## 2018-11-12 ENCOUNTER — Other Ambulatory Visit: Payer: Self-pay | Admitting: *Deleted

## 2018-11-12 DIAGNOSIS — I483 Typical atrial flutter: Secondary | ICD-10-CM

## 2018-11-12 NOTE — Progress Notes (Signed)
ORDER FOR CARDIOVERSION COMPLETED

## 2018-11-14 ENCOUNTER — Encounter (HOSPITAL_COMMUNITY): Payer: Self-pay | Admitting: *Deleted

## 2018-11-14 ENCOUNTER — Other Ambulatory Visit: Payer: Self-pay

## 2018-11-14 ENCOUNTER — Ambulatory Visit (HOSPITAL_COMMUNITY)
Admission: RE | Admit: 2018-11-14 | Discharge: 2018-11-14 | Disposition: A | Discharge status: Home / Self Care | Payer: PPO | Source: Ambulatory Visit | Attending: Cardiology | Admitting: Cardiology

## 2018-11-14 ENCOUNTER — Ambulatory Visit (HOSPITAL_COMMUNITY): Payer: PPO | Admitting: Anesthesiology

## 2018-11-14 ENCOUNTER — Telehealth: Payer: Self-pay | Admitting: Cardiology

## 2018-11-14 ENCOUNTER — Encounter (HOSPITAL_COMMUNITY)
Admission: RE | Disposition: A | Discharge status: Home / Self Care | Payer: Self-pay | Source: Ambulatory Visit | Attending: Cardiology

## 2018-11-14 DIAGNOSIS — I10 Essential (primary) hypertension: Secondary | ICD-10-CM | POA: Diagnosis not present

## 2018-11-14 DIAGNOSIS — Z951 Presence of aortocoronary bypass graft: Secondary | ICD-10-CM | POA: Diagnosis not present

## 2018-11-14 DIAGNOSIS — I480 Paroxysmal atrial fibrillation: Secondary | ICD-10-CM | POA: Insufficient documentation

## 2018-11-14 DIAGNOSIS — E785 Hyperlipidemia, unspecified: Secondary | ICD-10-CM | POA: Diagnosis not present

## 2018-11-14 DIAGNOSIS — I483 Typical atrial flutter: Secondary | ICD-10-CM | POA: Diagnosis not present

## 2018-11-14 DIAGNOSIS — I2510 Atherosclerotic heart disease of native coronary artery without angina pectoris: Secondary | ICD-10-CM | POA: Insufficient documentation

## 2018-11-14 DIAGNOSIS — Z79899 Other long term (current) drug therapy: Secondary | ICD-10-CM | POA: Diagnosis not present

## 2018-11-14 DIAGNOSIS — Z8249 Family history of ischemic heart disease and other diseases of the circulatory system: Secondary | ICD-10-CM | POA: Diagnosis not present

## 2018-11-14 DIAGNOSIS — Z7901 Long term (current) use of anticoagulants: Secondary | ICD-10-CM | POA: Diagnosis not present

## 2018-11-14 DIAGNOSIS — I4892 Unspecified atrial flutter: Secondary | ICD-10-CM | POA: Diagnosis not present

## 2018-11-14 HISTORY — PX: CARDIOVERSION: SHX1299

## 2018-11-14 SURGERY — CARDIOVERSION
Anesthesia: General

## 2018-11-14 MED ORDER — AMIODARONE HCL 200 MG PO TABS: 400.0000 mg | ORAL_TABLET | Freq: Every day | ORAL | Status: DC

## 2018-11-14 MED ORDER — SODIUM CHLORIDE 0.9 % IV SOLN
INTRAVENOUS | Status: DC
  Administered 2018-11-14: 08:00:00 via INTRAVENOUS

## 2018-11-14 MED ORDER — APIXABAN 5 MG PO TABS
5.0000 mg | ORAL_TABLET | ORAL | Status: AC
  Administered 2018-11-14: 5 mg via ORAL
  Filled 2018-11-14: qty 1

## 2018-11-14 MED ORDER — AMIODARONE HCL 200 MG PO TABS: ORAL_TABLET | ORAL | 4 refills | Status: DC

## 2018-11-14 MED ORDER — LIDOCAINE 2% (20 MG/ML) 5 ML SYRINGE
INTRAMUSCULAR | Status: DC | PRN
  Administered 2018-11-14: 20 mg via INTRAVENOUS

## 2018-11-14 MED ORDER — PROPOFOL 10 MG/ML IV BOLUS
INTRAVENOUS | Status: DC | PRN
  Administered 2018-11-14: 70 mg via INTRAVENOUS

## 2018-11-14 MED ORDER — ATORVASTATIN CALCIUM 40 MG PO TABS: 20.0000 mg | ORAL_TABLET | Freq: Every evening | ORAL | Status: DC

## 2018-11-14 NOTE — Telephone Encounter (Signed)
New Message   Pt c/o medication issue:  1. Name of Medication: amiodarone  2. How are you currently taking this medication (dosage and times per day)?   3. Are you having a reaction (difficulty breathing--STAT)?   4. What is your medication issue? Patients wife is calling on his behalf. He had a cardioversion this morning and was advised to contact the provider to see if he needed to continue take the amiodarone. Please call to discuss.

## 2018-11-14 NOTE — Anesthesia Preprocedure Evaluation (Signed)
Anesthesia Evaluation  Patient identified by MRN, date of birth, ID band Patient awake    Reviewed: Allergy & Precautions, NPO status , Patient's Chart, lab work & pertinent test results, reviewed documented beta blocker date and time   Airway Mallampati: II  TM Distance: >3 FB Neck ROM: Full    Dental   Pulmonary neg pulmonary ROS,    breath sounds clear to auscultation       Cardiovascular hypertension, Pt. on home beta blockers + CAD and + CABG  + dysrhythmias Atrial Fibrillation  Rhythm:Regular Rate:Normal     Neuro/Psych negative neurological ROS     GI/Hepatic negative GI ROS, Neg liver ROS,   Endo/Other  negative endocrine ROS  Renal/GU negative Renal ROS     Musculoskeletal   Abdominal   Peds  Hematology negative hematology ROS (+)   Anesthesia Other Findings   Reproductive/Obstetrics                             Anesthesia Physical Anesthesia Plan  ASA: III  Anesthesia Plan: General   Post-op Pain Management:    Induction: Intravenous  PONV Risk Score and Plan: 2 and Treatment may vary due to age or medical condition  Airway Management Planned: Mask and Natural Airway  Additional Equipment:   Intra-op Plan:   Post-operative Plan:   Informed Consent: I have reviewed the patients History and Physical, chart, labs and discussed the procedure including the risks, benefits and alternatives for the proposed anesthesia with the patient or authorized representative who has indicated his/her understanding and acceptance.   Dental advisory given  Plan Discussed with: CRNA  Anesthesia Plan Comments:         Anesthesia Quick Evaluation

## 2018-11-14 NOTE — Discharge Instructions (Signed)
Electrical Cardioversion, Care After °This sheet gives you information about how to care for yourself after your procedure. Your health care provider may also give you more specific instructions. If you have problems or questions, contact your health care provider. °What can I expect after the procedure? °After the procedure, it is common to have: °· Some redness on the skin where the shocks were given. ° °Follow these instructions at home: °· Do not drive for 24 hours if you were given a medicine to help you relax (sedative). °· Take over-the-counter and prescription medicines only as told by your health care provider. °· Ask your health care provider how to check your pulse. Check it often. °· Rest for 48 hours after the procedure or as told by your health care provider. °· Avoid or limit your caffeine use as told by your health care provider. °Contact a health care provider if: °· You feel like your heart is beating too quickly or your pulse is not regular. °· You have a serious muscle cramp that does not go away. °Get help right away if: °· You have discomfort in your chest. °· You are dizzy or you feel faint. °· You have trouble breathing or you are short of breath. °· Your speech is slurred. °· You have trouble moving an arm or leg on one side of your body. °· Your fingers or toes turn cold or blue. °This information is not intended to replace advice given to you by your health care provider. Make sure you discuss any questions you have with your health care provider. °Document Released: 09/30/2013 Document Revised: 07/13/2016 Document Reviewed: 06/15/2016 °Elsevier Interactive Patient Education © 2018 [employer] ° °

## 2018-11-14 NOTE — Telephone Encounter (Signed)
SPOKE TO PATIENT . INFORMATION RELAYED TO PATIENT.  PER DR HARDING  INSTRUCTIONS.   PATIENT VERBALIZED UNDERSTANDING-  WILL CONTINUE WITH AMIODARONE 200 MG FOR 1 WEEK THEN DECREASE TO 100 MG DAILY.  UNTIL FOLLOW UP WITH EXTENDER 12/09/18 -  FOR PRE OP FOR UPCOMING PROCEDURE

## 2018-11-14 NOTE — Interval H&P Note (Signed)
History and Physical Interval Note:  11/14/2018 8:14 AM  Alfred Thompson  has presented today for surgery, with the diagnosis of A-FLUTTER  The various methods of treatment have been discussed with the patient and family. After consideration of risks, benefits and other options for treatment, the patient has consented to  Procedure(s): CARDIOVERSION (N/A) as a surgical intervention .  The patient's history has been reviewed, patient examined, no change in status, stable for surgery.  I have reviewed the patient's chart and labs.  Questions were answered to the patient's satisfaction.     Armanda Magicraci Dane Bloch

## 2018-11-14 NOTE — CV Procedure (Addendum)
   Electrical Cardioversion Procedure Note Sherwood GamblerRonald C Bala [id number] 07/02/1946  Procedure: Electrical Cardioversion Indications:  Atrial Flutter  Time Out: Verified patient identification, verified procedure,medications/allergies/relevent history reviewed, required imaging and test results available.  Performed  Procedure Details  The patient was NPO after midnight. Anesthesia was administered at the beside  by Dr.Fitzgerald with 70mg  of propofol and Lidocaine 20mg   Cardioversion was done with synchronized biphasic defibrillation with AP pads with 150watts.  The patient converted to normal sinus rhythm. The patient tolerated the procedure well   IMPRESSION:  Successful cardioversion of atrial flutter    Audris Speaker 11/14/2018, 8:14 AM

## 2018-11-14 NOTE — Anesthesia Postprocedure Evaluation (Signed)
Anesthesia Post Note  Patient: Alfred Thompson  Procedure(s) Performed: CARDIOVERSION (N/A )     Patient location during evaluation: PACU Anesthesia Type: General Level of consciousness: awake and alert Pain management: pain level controlled Vital Signs Assessment: post-procedure vital signs reviewed and stable Respiratory status: spontaneous breathing, nonlabored ventilation, respiratory function stable and patient connected to nasal cannula oxygen Cardiovascular status: blood pressure returned to baseline and stable Postop Assessment: no apparent nausea or vomiting Anesthetic complications: no    Last Vitals:  Vitals:   11/14/18 0840 11/14/18 0850  BP: 112/69 115/64  Pulse: 62 (!) 58  Resp: 13 15  Temp:    SpO2: 93% 96%    Last Pain:  Vitals:   11/14/18 0850  TempSrc:   PainSc: 0-No pain                 Kennieth RadFitzgerald, Nahal Wanless E

## 2018-11-14 NOTE — Transfer of Care (Signed)
Immediate Anesthesia Transfer of Care Note  Patient: Alfred Thompson  Procedure(s) Performed: CARDIOVERSION (N/A )  Patient Location: Endoscopy Unit  Anesthesia Type:General  Level of Consciousness: awake, alert  and oriented  Airway & Oxygen Therapy: Patient Spontanous Breathing  Post-op Assessment: Report given to RN and Post -op Vital signs reviewed and stable  Post vital signs: Reviewed and stable  Last Vitals:  Vitals Value Taken Time  BP    Temp    Pulse    Resp    SpO2      Last Pain:  Vitals:   11/14/18 0730  TempSrc: Oral  PainSc: 0-No pain         Complications: No apparent anesthesia complications

## 2018-11-14 NOTE — Telephone Encounter (Signed)
Forward to  Dr Herbie BaltimoreHarding, awaiting reply will contact patient   cardioversion was a success - see FILE  EKG

## 2018-11-14 NOTE — Telephone Encounter (Signed)
I think probably we can go down to 200 mg a day amiodarone for a week and then go down to 100 mg a day maintenance until seen in follow-up.  May be best to stay on it until his surgery is done and then can stop postop.  This way, we do not worry about him going in and out of it during his surgery. I would also feel more comfortable holding his anticoagulation for surgery if he is on amiodarone.  Bryan Lemmaavid Harding, MD

## 2018-11-17 ENCOUNTER — Encounter (HOSPITAL_COMMUNITY): Payer: Self-pay | Admitting: Cardiology

## 2018-12-01 ENCOUNTER — Other Ambulatory Visit: Payer: Self-pay | Admitting: Cardiology

## 2018-12-02 ENCOUNTER — Other Ambulatory Visit: Payer: Self-pay | Admitting: Urology

## 2018-12-03 ENCOUNTER — Telehealth: Payer: Self-pay | Admitting: Cardiology

## 2018-12-03 NOTE — Telephone Encounter (Signed)
Left vm for Alfred Thompson to call our office back

## 2018-12-03 NOTE — Telephone Encounter (Signed)
Spoke with Alfred Thompson from requesting office she is aware that patient has a follow up visit with [name] PA on 12/09/18 and from that visit we will reach out to their office with plan.

## 2018-12-03 NOTE — Telephone Encounter (Signed)
New message   Junious DresserConnie from [facility] is calling to get clarification on the surgical clearance they sent. She said they received a note from Dr. Herbie BaltimoreHarding.

## 2018-12-09 ENCOUNTER — Encounter: Payer: Self-pay | Admitting: Cardiology

## 2018-12-09 ENCOUNTER — Ambulatory Visit (INDEPENDENT_AMBULATORY_CARE_PROVIDER_SITE_OTHER): Payer: PPO | Admitting: Cardiology

## 2018-12-09 ENCOUNTER — Encounter (INDEPENDENT_AMBULATORY_CARE_PROVIDER_SITE_OTHER): Payer: Self-pay

## 2018-12-09 VITALS — BP 128/64 | HR 65 | Ht 69.0 in | Wt 208.0 lb

## 2018-12-09 DIAGNOSIS — Z7901 Long term (current) use of anticoagulants: Secondary | ICD-10-CM | POA: Diagnosis not present

## 2018-12-09 DIAGNOSIS — I480 Paroxysmal atrial fibrillation: Secondary | ICD-10-CM | POA: Diagnosis not present

## 2018-12-09 DIAGNOSIS — I440 Atrioventricular block, first degree: Secondary | ICD-10-CM

## 2018-12-09 DIAGNOSIS — Z951 Presence of aortocoronary bypass graft: Secondary | ICD-10-CM | POA: Diagnosis not present

## 2018-12-09 HISTORY — DX: Atrioventricular block, first degree: I44.0

## 2018-12-09 NOTE — Progress Notes (Signed)
12/09/2018 MARIAH HARN   Feb 19, 1946  [id number]  Primary Physician Farris Has, MD Primary Cardiologist: Dr Herbie Baltimore  HPI: Mr. Cajuste is a pleasant 72 year old male followed by Dr. Herbie Baltimore with a history of coronary disease.  He had CABG x4 in October 2014.  Myoview study done in February 2018 was low risk.  He has PAF.  He had previously been on amiodarone but this was stopped for possible ophthalmologic complications.  He is on Eliquis.  He needs a prostate biopsy but was found to be in atrial flutter.  Dr. Herbie Baltimore felt it would be best if the patient was in sinus rhythm and before we stopped Eliquis for this.  Patient was placed on amiodarone with a decreasing loading dose.  He underwent outpatient cardioversion on November 14, 2018.  He is in the office today for follow-up.  He is doing well, he denies any palpitations or tachycardia.  His EKG shows normal sinus rhythm with a rate of 65.  He is now on amiodarone 100 mg daily.  Dr. Herbie Baltimore wanted to continue this for a time period post biopsy. The pt will be cleared to have his prostrate biopsy after 12/13/18.    Current Outpatient Medications  Medication Sig Dispense Refill  . amiodarone (PACERONE) 200 MG tablet Take 1 tablet (200 mg total) by mouth daily for 7 days, THEN 0.5 tablets (100 mg total) daily. 30 tablet 4  . apixaban (ELIQUIS) 5 MG TABS tablet Take 1 tablet (5 mg total) by mouth 2 (two) times daily. 180 tablet 3  . atorvastatin (LIPITOR) 40 MG tablet Take 0.5 tablets (20 mg total) by mouth every evening.    . metoprolol succinate (TOPROL-XL) 25 MG 24 hr tablet TAKE 1 TABLET BY MOUTH ONCE DAILY 90 tablet 3  . potassium chloride (K-DUR,KLOR-CON) 10 MEQ tablet TAKE 1 TABLET BY MOUTH ONCE DAILY 90 tablet 3   No current facility-administered medications for this visit.     No Known Allergies  Past Medical History:  Diagnosis Date  . Back pain, chronic   . Left main coronary artery disease 10/09/2013   Distal Left Main  80-90%; ostial/proximal LAD 50-80%, proximal RI 80%, proximal RCA napkin ring 80-90%; relatively normal Circumflex/Lateral OM --> referred for CABG; 2-D echo March 2015: EF 55-60% with no regional wall motion on his  . Obesity (BMI 30-39.9)   . Paroxysmal A-fib (HCC) 10/09/2013   Admitted with Afib RVR, 2 episodes post-op.  . S/P CABG x 4 10/10/2013   (LIMA to LAD, SVG to OM, SVG to RAMUS INTERMEDIATE, and SVG to RCA  . Unstable angina (HCC) 10/09/2013    Social History   Socioeconomic History  . Marital status: Married    Spouse name: Not on file  . Number of children: Not on file  . Years of education: Not on file  . Highest education level: Not on file  Occupational History  . Not on file  Social Needs  . Financial resource strain: Not on file  . Food insecurity:    Worry: Not on file    Inability: Not on file  . Transportation needs:    Medical: Not on file    Non-medical: Not on file  Tobacco Use  . Smoking status: Never Smoker  . Smokeless tobacco: Never Used  Substance and Sexual Activity  . Alcohol use: Yes    Alcohol/week: 0.0 standard drinks    Comment: occasional  . Drug use: No  . Sexual activity: Not on file  Lifestyle  . Physical activity:    Days per week: Not on file    Minutes per session: Not on file  . Stress: Not on file  Relationships  . Social connections:    Talks on phone: Not on file    Gets together: Not on file    Attends religious service: Not on file    Active member of club or organization: Not on file    Attends meetings of clubs or organizations: Not on file    Relationship status: Not on file  . Intimate partner violence:    Fear of current or ex partner: Not on file    Emotionally abused: Not on file    Physically abused: Not on file    Forced sexual activity: Not on file  Other Topics Concern  . Not on file  Social History Narrative   Married, one son, no grandchildren. He drives for [employer]     Family History   Problem Relation Age of Onset  . Coronary artery disease Father [id number]       CABG  . Rectal cancer Mother   . Transient ischemic attack Mother      Review of Systems: General: negative for chills, fever, night sweats or weight changes.  Cardiovascular: negative for chest pain, dyspnea on exertion, edema, orthopnea, palpitations, paroxysmal nocturnal dyspnea or shortness of breath Dermatological: negative for rash Respiratory: negative for cough or wheezing Urologic: negative for hematuria Abdominal: negative for nausea, vomiting, diarrhea, bright red blood per rectum, melena, or hematemesis Neurologic: negative for visual changes, syncope, or dizziness All other systems reviewed and are otherwise negative except as noted above.    Blood pressure 128/64, pulse 65, height 5\' 9"  (1.753 m), weight 208 lb (94.3 kg).  General appearance: alert, cooperative and no distress Neck: no carotid bruit and no JVD Lungs: clear to auscultation bilaterally Heart: regular rate and rhythm Extremities: extremities normal, atraumatic, no cyanosis or edema Neurologic: Grossly normal  EKG [id number]-65  ASSESSMENT AND PLAN:   PAF (paroxysmal atrial fibrillation) (HCC) PAF in 2014 post CABG with recurrent PAF-flutter Nov 2019. S/P OP DCCV 11/14/18. Holding NSR on low dose Amiodarone  S/P CABG x 4 H/O CABG x 26 Sep 2013, low risk Myoview Feb 2018  Chronic anticoagulation, for PAF with eliquis CHADS VASC=2 Discussed with Pharmacy- hold Eliquis 48 hours pre op and resume when able post op   PLAN   Chart reviewed and patient seen and examined today as part of pre-operative protocol coverage. Based on ACC/AHA guidelines, Sherwood GamblerRonald C Rorke would be at acceptable risk for the planned procedure after 12/13/18 without further cardiovascular testing.   OK to hold Eliquis 48 hours pre op.   I will route this recommendation to the requesting party via Epic fax function and remove from pre-op pool.  Please call with  questions.  Corine ShelterLuke Dayshawn Irizarry, PA-C 12/09/2018, 10:41 AM

## 2018-12-09 NOTE — Patient Instructions (Signed)
Medication Instructions:  Your physician recommends that you continue on your current medications as directed. Please refer to the Current Medication list given to you today. If you need a refill on your cardiac medications before your next appointment, please call your pharmacy.   Lab work: NONE  If you have labs (blood work) drawn today and your tests are completely normal, you will receive your results only by: Marland Kitchen. MyChart Message (if you have MyChart) OR . A paper copy in the mail If you have any lab test that is abnormal or we need to change your treatment, we will call you to review the results.  Testing/Procedures: NONE  Follow-Up: At [facility], you and your health needs are our priority.  As part of our continuing mission to provide you with exceptional heart care, we have created designated Provider Care Teams.  These Care Teams include your primary Cardiologist (physician) and Advanced Practice Providers (APPs -  Physician Assistants and Nurse Practitioners) who all work together to provide you with the care you need, when you need it. . Your physician recommends that you schedule a follow-up appointment in: 4 WEEKS WITH DR HARDING ONLY.  Any Other Special Instructions Will Be Listed Below (If Applicable).

## 2018-12-09 NOTE — Assessment & Plan Note (Signed)
PAF in 2014 post CABG with recurrent PAF-flutter Nov 2019. S/P OP DCCV 11/14/18. Holding NSR on low dose Amiodarone

## 2018-12-09 NOTE — Assessment & Plan Note (Signed)
CHADS VASC=2 Discussed with Pharmacy- hold Eliquis 48 hours pre op and resume when able post op

## 2018-12-09 NOTE — Telephone Encounter (Signed)
Patient with diagnosis of Afib recently cardioverted (will need to wait the 4 weeks as per discussion) on Eliquis for anticoagulation.    Procedure: prostate biopsy Date of procedure: TBD (after 4 weeks post cardioversion)  CHADS2-VASc score of  2 (CHF, HTN, AGE, DM2, stroke/tia x 2, CAD, AGE, male)  Per office protocol, patient can hold Eliquis for 2 days prior to procedure.

## 2018-12-09 NOTE — Assessment & Plan Note (Signed)
H/O CABG x 26 Sep 2013, low risk Myoview Feb 2018

## 2018-12-10 ENCOUNTER — Other Ambulatory Visit (HOSPITAL_COMMUNITY): Payer: Self-pay | Admitting: Urology

## 2018-12-10 ENCOUNTER — Other Ambulatory Visit: Payer: Self-pay | Admitting: Urology

## 2018-12-10 DIAGNOSIS — R972 Elevated prostate specific antigen [PSA]: Secondary | ICD-10-CM

## 2018-12-30 ENCOUNTER — Encounter (HOSPITAL_COMMUNITY): Payer: Self-pay

## 2018-12-30 NOTE — Pre-Procedure Instructions (Signed)
The following are in epic: Cardiac clearance note and last office visit note 12/09/2018 L. Kilroy P.A. EKG 12/09/2018 Stress test 02/15/2017

## 2018-12-30 NOTE — Patient Instructions (Signed)
Your procedure is scheduled on: Friday, Jan. 10, 2020   Surgery Time: 8:30AM-9:15AM   Report to [facility] Main  Entrance    Report to admitting at 6:30 AM   Call this number if you have problems the morning of surgery [phone]   Do not eat food or drink liquids :After Midnight.   Brush your teeth the morning of surgery.   Do NOT smoke after Midnight   Take these medicines the morning of surgery with A SIP OF WATER: Amiodarone, Metoprolol                               You may not have any metal on your body including jewelry, and body piercings             Do not wear lotions, powders, perfumes/cologne, or deodorant                           Men may shave face and neck.   Do not bring valuables to the hospital. [facility] IS NOT             RESPONSIBLE   FOR VALUABLES.   Contacts, dentures or bridgework may not be worn into surgery.   Leave suitcase in the car. After surgery it may be brought to your room.   Special Instructions: Bring a copy of your healthcare power of attorney and living will documents         the day of surgery if you haven't scanned them in before.              Please read over the following fact sheets you were given:  [facility] - Preparing for Surgery Before surgery, you can play an important role.  Because skin is not sterile, your skin needs to be as free of germs as possible.  You can reduce the number of germs on your skin by washing with CHG (chlorahexidine gluconate) soap before surgery.  CHG is an antiseptic cleaner which kills germs and bonds with the skin to continue killing germs even after washing. Please DO NOT use if you have an allergy to CHG or antibacterial soaps.  If your skin becomes reddened/irritated stop using the CHG and inform your nurse when you arrive at Short Stay. Do not shave (including legs and underarms) for at least 48 hours prior to the first CHG shower.  You may shave your face/neck.  Please follow  these instructions carefully:  1.  Shower with CHG Soap the night before surgery and the  morning of surgery.  2.  If you choose to wash your hair, wash your hair first as usual with your normal  shampoo.  3.  After you shampoo, rinse your hair and body thoroughly to remove the shampoo.                             4.  Use CHG as you would any other liquid soap.  You can apply chg directly to the skin and wash.  Gently with a scrungie or clean washcloth.  5.  Apply the CHG Soap to your body ONLY FROM THE NECK DOWN.   Do   not use on face/ open  Wound or open sores. Avoid contact with eyes, ears mouth and   genitals (private parts).                       Wash face,  Genitals (private parts) with your normal soap.             6.  Wash thoroughly, paying special attention to the area where your    surgery  will be performed.  7.  Thoroughly rinse your body with warm water from the neck down.  8.  DO NOT shower/wash with your normal soap after using and rinsing off the CHG Soap.                9.  Pat yourself dry with a clean towel.            10.  Wear clean pajamas.            11.  Place clean sheets on your bed the night of your first shower and do not  sleep with pets. Day of Surgery : Do not apply any lotions/deodorants the morning of surgery.  Please wear clean clothes to the hospital/surgery center.  FAILURE TO FOLLOW THESE INSTRUCTIONS MAY RESULT IN THE CANCELLATION OF YOUR SURGERY  PATIENT SIGNATURE_________________________________  NURSE SIGNATURE__________________________________  ________________________________________________________________________
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
## 2018-12-30 NOTE — Discharge Instructions (Signed)
Transrectal Ultrasound-Guided Prostate Biopsy, Care After °This sheet gives you information about how to care for yourself after your procedure. Your health care provider may also give you more specific instructions. If you have problems or questions, contact your health care provider. °What can I expect after the procedure? °After the procedure, it is common to have: °· Pain and discomfort near your rectum, especially while sitting. °· Pink-colored urine due to small amounts of blood in your urine. °· A burning feeling while urinating. °· Blood in your stool (feces) or bleeding from your rectum. °· Blood in your semen. °Follow these instructions at home: °Medicines °· Take over-the-counter and prescription medicines only as told by your health care provider. °· If you were prescribed antibiotic medicine, take it as told by your health care provider. Do not stop taking the antibiotic even if you start to feel better. °Activity °· Do not drive for 24 hours if you were given a medicine to help you relax (sedative) during your procedure. °· Return to your normal activities as told by your health care provider. Ask your health care provider what activities are safe for you. °· Ask your health care provider when it is okay for you to resume sexual activity. °· Do not lift anything that is heavier than 10 lb (4.5 kg), or the limit that you are told, until your health care provider says that it is safe. °General instructions ° °· Drink enough water to keep your urine pale yellow. °· Watch your urine, stool, and semen for new or increased bleeding. °· Keep all follow-up visits as told by your health care provider. This is important. °Contact a health care provider if: °· You have any of the following: °? Blood clots in your urine or stool. °? Blood in your urine more than 2 weeks after the procedure. °? Blood in your semen more than 2 months after the procedure. °? New or increased bleeding in your urine, stool, or  semen. °? Severe pain in your abdomen. °· Your urine smells bad or unusual. °· You have trouble urinating. °· Your lower abdomen feels firm. °· You have problems getting an erection. °· You have nausea or you vomit. °Get help right away if: °· You have a fever or chills. This could be a sign of infection. °· You have bright red urine. °· You have severe pain that does not get better with medicine. °· You cannot urinate. °Summary °· After this procedure, it is common to have pain and discomfort around your rectum, especially while sitting. °· You may have blood in your urine and stool after the procedure. °· It is common to have blood in your semen for 1-2 months after this procedure. °· If you were prescribed antibiotic medicine, take it as told by your health care provider. Do not stop taking the antibiotic even if you start to feel better. °· Get help right away if you have a fever or chills. This could be a sign of infection. ° ° °

## 2018-12-31 ENCOUNTER — Encounter (HOSPITAL_COMMUNITY)
Admission: RE | Admit: 2018-12-31 | Discharge: 2018-12-31 | Disposition: A | Discharge status: Home / Self Care | Payer: PPO | Source: Ambulatory Visit | Attending: Urology | Admitting: Urology

## 2018-12-31 ENCOUNTER — Encounter (HOSPITAL_COMMUNITY): Payer: Self-pay

## 2018-12-31 ENCOUNTER — Other Ambulatory Visit: Payer: Self-pay

## 2018-12-31 DIAGNOSIS — E7800 Pure hypercholesterolemia, unspecified: Secondary | ICD-10-CM | POA: Diagnosis not present

## 2018-12-31 DIAGNOSIS — Z79899 Other long term (current) drug therapy: Secondary | ICD-10-CM | POA: Diagnosis not present

## 2018-12-31 DIAGNOSIS — Z7901 Long term (current) use of anticoagulants: Secondary | ICD-10-CM | POA: Diagnosis not present

## 2018-12-31 DIAGNOSIS — I4891 Unspecified atrial fibrillation: Secondary | ICD-10-CM | POA: Diagnosis not present

## 2018-12-31 DIAGNOSIS — R972 Elevated prostate specific antigen [PSA]: Secondary | ICD-10-CM | POA: Diagnosis not present

## 2018-12-31 DIAGNOSIS — Z951 Presence of aortocoronary bypass graft: Secondary | ICD-10-CM | POA: Diagnosis not present

## 2018-12-31 DIAGNOSIS — I2510 Atherosclerotic heart disease of native coronary artery without angina pectoris: Secondary | ICD-10-CM | POA: Diagnosis not present

## 2018-12-31 DIAGNOSIS — Z01812 Encounter for preprocedural laboratory examination: Secondary | ICD-10-CM | POA: Insufficient documentation

## 2018-12-31 HISTORY — DX: Hyperlipidemia, unspecified: E78.5

## 2018-12-31 HISTORY — DX: Pleural effusion, not elsewhere classified: J90

## 2018-12-31 HISTORY — DX: Venous insufficiency (chronic) (peripheral): I87.2

## 2018-12-31 HISTORY — DX: Atrioventricular block, first degree: I44.0

## 2018-12-31 HISTORY — DX: Obesity, unspecified: E66.9

## 2018-12-31 HISTORY — DX: Personal history of other diseases of the circulatory system: Z86.79

## 2018-12-31 LAB — BASIC METABOLIC PANEL
Anion gap: 6 (ref 5–15)
BUN: 14 mg/dL (ref 8–23)
CHLORIDE: 108 mmol/L (ref 98–111)
CO2: 30 mmol/L (ref 22–32)
Calcium: 9.1 mg/dL (ref 8.9–10.3)
Creatinine, Ser: 1.14 mg/dL (ref 0.61–1.24)
GFR calc non Af Amer: >60 mL/min (ref >60)
Glucose, Bld: 88 mg/dL (ref 70–99)
Potassium: 4.5 mmol/L (ref 3.5–5.1)
Sodium: 144 mmol/L (ref 135–145)

## 2018-12-31 LAB — CBC
HCT: 47.6 % (ref 39.0–52.0)
Hemoglobin: 15.7 g/dL (ref 13.0–17.0)
MCH: 33.3 pg (ref 26.0–34.0)
MCHC: 33 g/dL (ref 30.0–36.0)
MCV: 101.1 fL — ABNORMAL HIGH (ref 80.0–100.0)
NRBC: 0 % (ref 0.0–0.2)
Platelets: 160 10*3/uL (ref 150–400)
RBC: 4.71 MIL/uL (ref 4.22–5.81)
RDW: 13.4 % (ref 11.5–15.5)
WBC: 10.7 10*3/uL — ABNORMAL HIGH (ref 4.0–10.5)

## 2018-12-31 NOTE — Progress Notes (Signed)
Anesthesia Chart Review   Case:  [id number] Date/Time:  01/02/19 0815   Procedure:  BIOPSY TRANSRECTAL ULTRASONIC PROSTATE (TUBP) (N/A )   Anesthesia type:  General   Pre-op diagnosis:  ELEVATED PROSTATE SPECIFIC ANTIGEN   Location:  [facility] PROCEDURE [facility]OM / [facility]   Surgeon:  Ihor Gully, MD      DISCUSSION: 73 yo never smoker with h/o HLD, paroxysmal A-fib, CAD sx CABG x4 09/2013 scheduled for above surgery on 01/02/2019 with Dr. Ihor Gully.    He was last seen by cardiology on 12/09/18.  Seen by Corine Shelter, PA-C.  Per his note, "He had CABG x4 in October 2014.  Myoview study done in February 2018 was low risk.  He has PAF.  He had previously been on amiodarone but this was stopped for possible ophthalmologic complications.  He is on Eliquis.  He needs a prostate biopsy but was found to be in atrial flutter.  Dr. Herbie Baltimore felt it would be best if the patient was in sinus rhythm and before we stopped Eliquis for this.  Patient was placed on amiodarone with a decreasing loading dose.  He underwent outpatient cardioversion on November 14, 2018.  He is in the office today for follow-up.  He is doing well, he denies any palpitations or tachycardia.  His EKG shows normal sinus rhythm with a rate of 65.  He is now on amiodarone 100 mg daily.  Dr. Herbie Baltimore wanted to continue this for a time period post biopsy. The pt will be cleared to have his prostrate biopsy after 12/13/18. Chart reviewed and patient seen and examined today as part of pre-operative protocol coverage. Based on ACC/AHA guidelines, Alfred Thompson would be at acceptable risk for the planned procedure after 12/13/18 without further cardiovascular testing."   He was advised to hold Eliquis 48 hours pre op.       Pt can proceed with planned procedure barring acute status change.   VS: BP 119/66   Pulse (!) 59   Temp 37.4 C (Oral)   Resp 16   Ht 5\' 9"  (1.753 m)   Wt 95.4 kg   SpO2 97%   BMI 31.05 kg/m   PROVIDERS: Farris Has, MD is  PCP  Bryan Lemma, MD is Cardiologist  LABS: Labs reviewed: Acceptable for surgery. (all labs ordered are listed, but only abnormal results are displayed)  Labs Reviewed  CBC - Abnormal; Notable for the following components:      Result Value   WBC 10.7 (*)    MCV 101.1 (*)    All other components within normal limits  BASIC METABOLIC PANEL    IMAGES:   EKG: 12/09/18 Rate 65 bpm Sinus rhythm with marked sinus arrhythmia with 1st degree AV block Nonspecific ST and T wave abnormality  CV: Echo 03/04/14 Study Conclusions  - Left ventricle: The cavity size was normal. Systolic function was normal. The estimated ejection fraction was in the range of 55% to 60%. Wall motion was normal; there were no regional wall motion abnormalities. - Left atrium: The atrium was mildly dilated. - Atrial septum: No defect or patent foramen ovale was identified.  Stress Test 02/15/17  The left ventricular ejection fraction is normal (55-65%).  Nuclear stress EF: 57%.  There is a very subtle defect of mild severity present in the apical lateral location. The defect is reversible but likely represents variations in diaphragmatic attenuation artifact although cannot rule out a very small area of ischemia.  This is a low  risk study. Past Medical History:  Diagnosis Date  . Back pain, chronic    Resolved  . First degree AV block 12/09/2018   Noted on EKG  . History of atrial flutter   . History of bradycardia   . History of cardiomegaly 05/1999  . Hyperlipidemia   . Left main coronary artery disease 10/09/2013   Distal Left Main 80-90%; ostial/proximal LAD 50-80%, proximal RI 80%, proximal RCA napkin ring 80-90%; relatively normal Circumflex/Lateral OM --> referred for CABG; 2-D echo March 2015: EF 55-60% with no regional wall motion on his  . Obesity   . Obesity (BMI 30-39.9)   . Paroxysmal A-fib (HCC) 10/09/2013   Admitted with Afib RVR, 2 episodes post-op.  . Pleural  effusion, left 09/2013  . S/P CABG x 4 10/10/2013   (LIMA to LAD, SVG to OM, SVG to RAMUS INTERMEDIATE, and SVG to RCA  . Unstable angina (HCC) 10/09/2013  . Venous insufficiency    Left leg    Past Surgical History:  Procedure Laterality Date  . CARDIOVERSION N/A 11/14/2018   Procedure: CARDIOVERSION;  Surgeon: Quintella Reichert, MD;  Location: [facility] ENDOSCOPY;  Service: Cardiovascular;  Laterality: N/A;  . CORONARY ARTERY BYPASS GRAFT N/A 10/10/2013   Procedure: CORONARY ARTERY BYPASS GRAFTING (CABG);  Surgeon: Alleen Borne, MD;  Location: [facility] OR;  Service: Open Heart Surgery;  Laterality: N/A;  Times 4 using left internal mammary artery and endoscopically harvested left saphenous vein  . DUPUYTREN / PALMAR FASCIOTOMY Bilateral   . HERNIA REPAIR    . INTRAOPERATIVE TRANSESOPHAGEAL ECHOCARDIOGRAM N/A 10/10/2013   Procedure: INTRAOPERATIVE TRANSESOPHAGEAL ECHOCARDIOGRAM;  Surgeon: Alleen Borne, MD;  Location: [facility] OR;  Service: Open Heart Surgery;  Laterality: N/A;  . LEFT HEART CATHETERIZATION WITH CORONARY ANGIOGRAM N/A 10/09/2013   Procedure: LEFT HEART CATHETERIZATION WITH CORONARY ANGIOGRAM;  Surgeon: Marykay Lex, MD;  Location: [facility] CATH LAB;  Distal Left Main 80-90%; ostial/proximal LAD 50-80%, proximal RI 80%, proximal RCA napkin ring 80-90%; relatively normal Circumflex/Lateral OM --> referred for CABG  . NM MYOVIEW LTD  01/2017   EF 55-65%.  Subtle mild perfusion defect and apical lateral wall.  No reversibility.  Cannot exclude diaphragmatic attenuation.  LOW RISK.    MEDICATIONS: . amiodarone (PACERONE) 200 MG tablet  . apixaban (ELIQUIS) 5 MG TABS tablet  . atorvastatin (LIPITOR) 40 MG tablet  . metoprolol succinate (TOPROL-XL) 25 MG 24 hr tablet  . potassium chloride (K-DUR,KLOR-CON) 10 MEQ tablet   No current facility-administered medications for this encounter.      Janey Genta [facility] Pre-Surgical Testing [phone] 12/31/18 2:49 PM

## 2018-12-31 NOTE — Anesthesia Preprocedure Evaluation (Addendum)
Anesthesia Evaluation  Patient identified by MRN, date of birth, ID band Patient awake    Reviewed: Allergy & Precautions, NPO status , Patient's Chart, lab work & pertinent test results  Airway Mallampati: III  TM Distance: <3 FB Neck ROM: Full    Dental  (+) Teeth Intact, Dental Advisory Given   Pulmonary neg pulmonary ROS,    breath sounds clear to auscultation       Cardiovascular + CAD and + CABG  + dysrhythmias Atrial Fibrillation  Rhythm:Regular Rate:Normal     Neuro/Psych negative neurological ROS     GI/Hepatic negative GI ROS, Neg liver ROS,   Endo/Other    Renal/GU negative Renal ROS     Musculoskeletal negative musculoskeletal ROS (+)   Abdominal (+) + obese,   Peds  Hematology   Anesthesia Other Findings   Reproductive/Obstetrics                            Lab Results  Component Value Date   WBC 10.7 (H) 12/31/2018   HGB 15.7 12/31/2018   HCT 47.6 12/31/2018   MCV 101.1 (H) 12/31/2018   PLT 160 12/31/2018   Echo - 2015: - Left ventricle: The cavity size was normal. Systolic function was normal. The estimated ejection fraction was in the range of 55% to 60%. Wall motion was normal; there were no regional wall motion abnormalities. - Left atrium: The atrium was mildly dilated. - Atrial septum: No defect or patent foramen ovale was identified.  Anesthesia Physical Anesthesia Plan  ASA: III  Anesthesia Plan: General   Post-op Pain Management:    Induction: Intravenous  PONV Risk Score and Plan: 3 and Ondansetron and Dexamethasone  Airway Management Planned: LMA  Additional Equipment: None  Intra-op Plan:   Post-operative Plan: Extubation in OR  Informed Consent: I have reviewed the patients History and Physical, chart, labs and discussed the procedure including the risks, benefits and alternatives for the proposed anesthesia with the patient or  authorized representative who has indicated his/her understanding and acceptance.   Dental advisory given  Plan Discussed with: CRNA  Anesthesia Plan Comments: (See PST note 12/31/18, Jodell CiproJessica Zanetto, PA-C)      Anesthesia Quick Evaluation

## 2018-12-31 NOTE — Pre-Procedure Instructions (Signed)
CBC results 12/31/2018 sent to Dr. Vernie Ammons via epic

## 2019-01-02 ENCOUNTER — Ambulatory Visit (HOSPITAL_COMMUNITY)
Admission: RE | Admit: 2019-01-02 | Discharge: 2019-01-02 | Disposition: A | Discharge status: Home / Self Care | Payer: PPO | Source: Ambulatory Visit | Attending: Urology | Admitting: Urology

## 2019-01-02 ENCOUNTER — Encounter (HOSPITAL_COMMUNITY): Payer: Self-pay | Admitting: Emergency Medicine

## 2019-01-02 ENCOUNTER — Ambulatory Visit (HOSPITAL_COMMUNITY): Payer: PPO | Admitting: Anesthesiology

## 2019-01-02 ENCOUNTER — Ambulatory Visit (HOSPITAL_COMMUNITY): Payer: PPO | Admitting: Physician Assistant

## 2019-01-02 ENCOUNTER — Encounter (HOSPITAL_COMMUNITY)
Admission: RE | Disposition: A | Discharge status: Home / Self Care | Payer: Self-pay | Source: Home / Self Care | Attending: Urology

## 2019-01-02 ENCOUNTER — Ambulatory Visit (HOSPITAL_COMMUNITY)
Admission: RE | Admit: 2019-01-02 | Discharge: 2019-01-02 | Disposition: A | Discharge status: Home / Self Care | Payer: PPO | Attending: Urology | Admitting: Urology

## 2019-01-02 DIAGNOSIS — R972 Elevated prostate specific antigen [PSA]: Secondary | ICD-10-CM | POA: Insufficient documentation

## 2019-01-02 DIAGNOSIS — I2510 Atherosclerotic heart disease of native coronary artery without angina pectoris: Secondary | ICD-10-CM | POA: Insufficient documentation

## 2019-01-02 DIAGNOSIS — R001 Bradycardia, unspecified: Secondary | ICD-10-CM | POA: Diagnosis not present

## 2019-01-02 DIAGNOSIS — I4891 Unspecified atrial fibrillation: Secondary | ICD-10-CM | POA: Diagnosis not present

## 2019-01-02 DIAGNOSIS — Z951 Presence of aortocoronary bypass graft: Secondary | ICD-10-CM | POA: Diagnosis not present

## 2019-01-02 DIAGNOSIS — Z79899 Other long term (current) drug therapy: Secondary | ICD-10-CM | POA: Diagnosis not present

## 2019-01-02 DIAGNOSIS — Z7901 Long term (current) use of anticoagulants: Secondary | ICD-10-CM | POA: Insufficient documentation

## 2019-01-02 DIAGNOSIS — E7800 Pure hypercholesterolemia, unspecified: Secondary | ICD-10-CM | POA: Diagnosis not present

## 2019-01-02 HISTORY — PX: PROSTATE BIOPSY: SHX241

## 2019-01-02 SURGERY — BIOPSY, PROSTATE, RECTAL APPROACH, WITH US GUIDANCE
Anesthesia: General

## 2019-01-02 MED ORDER — LIDOCAINE HCL URETHRAL/MUCOSAL 2 % EX GEL
CUTANEOUS | Status: AC
  Filled 2019-01-02: qty 5

## 2019-01-02 MED ORDER — LACTATED RINGERS IV SOLN
INTRAVENOUS | Status: DC
  Administered 2019-01-02: 07:00:00 via INTRAVENOUS

## 2019-01-02 MED ORDER — DEXAMETHASONE SODIUM PHOSPHATE 10 MG/ML IJ SOLN
INTRAMUSCULAR | Status: DC | PRN
  Administered 2019-01-02: 8 mg via INTRAVENOUS

## 2019-01-02 MED ORDER — HYDROMORPHONE HCL 1 MG/ML IJ SOLN: 0.2500 mg | INTRAMUSCULAR | Status: DC | PRN

## 2019-01-02 MED ORDER — LIDOCAINE HCL 2 % IJ SOLN
INTRAMUSCULAR | Status: AC
  Filled 2019-01-02: qty 20

## 2019-01-02 MED ORDER — PROPOFOL 10 MG/ML IV BOLUS
INTRAVENOUS | Status: AC
  Filled 2019-01-02: qty 20

## 2019-01-02 MED ORDER — LIDOCAINE 2% (20 MG/ML) 5 ML SYRINGE
INTRAMUSCULAR | Status: AC
  Filled 2019-01-02: qty 5

## 2019-01-02 MED ORDER — DEXAMETHASONE SODIUM PHOSPHATE 10 MG/ML IJ SOLN
INTRAMUSCULAR | Status: AC
  Filled 2019-01-02: qty 1

## 2019-01-02 MED ORDER — ONDANSETRON HCL 4 MG/2ML IJ SOLN
INTRAMUSCULAR | Status: AC
  Filled 2019-01-02: qty 2

## 2019-01-02 MED ORDER — LEVOFLOXACIN IN D5W 750 MG/150ML IV SOLN
750.0000 mg | Freq: Once | INTRAVENOUS | Status: AC
  Administered 2019-01-02: 750 mg via INTRAVENOUS
  Filled 2019-01-02: qty 150

## 2019-01-02 MED ORDER — MEPERIDINE HCL 50 MG/ML IJ SOLN: 6.2500 mg | INTRAMUSCULAR | Status: DC | PRN

## 2019-01-02 MED ORDER — PHENYLEPHRINE 40 MCG/ML (10ML) SYRINGE FOR IV PUSH (FOR BLOOD PRESSURE SUPPORT)
PREFILLED_SYRINGE | INTRAVENOUS | Status: AC
  Filled 2019-01-02: qty 10

## 2019-01-02 MED ORDER — EPHEDRINE SULFATE-NACL 50-0.9 MG/10ML-% IV SOSY
PREFILLED_SYRINGE | INTRAVENOUS | Status: DC | PRN
  Administered 2019-01-02: 5 mg via INTRAVENOUS

## 2019-01-02 MED ORDER — PROPOFOL 10 MG/ML IV BOLUS
INTRAVENOUS | Status: DC | PRN
  Administered 2019-01-02: 150 mg via INTRAVENOUS

## 2019-01-02 MED ORDER — ONDANSETRON HCL 4 MG/2ML IJ SOLN
INTRAMUSCULAR | Status: DC | PRN
  Administered 2019-01-02: 4 mg via INTRAVENOUS

## 2019-01-02 MED ORDER — FENTANYL CITRATE (PF) 100 MCG/2ML IJ SOLN
INTRAMUSCULAR | Status: AC
  Filled 2019-01-02: qty 2

## 2019-01-02 MED ORDER — LACTATED RINGERS IV SOLN: INTRAVENOUS | Status: DC

## 2019-01-02 MED ORDER — FENTANYL CITRATE (PF) 100 MCG/2ML IJ SOLN
INTRAMUSCULAR | Status: DC | PRN
  Administered 2019-01-02 (×4): 25 ug via INTRAVENOUS

## 2019-01-02 MED ORDER — LIDOCAINE 2% (20 MG/ML) 5 ML SYRINGE
INTRAMUSCULAR | Status: DC | PRN
  Administered 2019-01-02: 50 mg via INTRAVENOUS

## 2019-01-02 MED ORDER — PROMETHAZINE HCL 25 MG/ML IJ SOLN: 6.2500 mg | INTRAMUSCULAR | Status: DC | PRN

## 2019-01-02 SURGICAL SUPPLY — 10 items
COVER SURGICAL LIGHT HANDLE (MISCELLANEOUS) ×1 IMPLANT
COVER WAND RF STERILE (DRAPES) IMPLANT
INSTR BIOPSY MAXCORE 18GX20 (NEEDLE) ×1 IMPLANT
LUBRICANT JELLY K Y 4OZ (MISCELLANEOUS) ×1 IMPLANT
NDL SAFETY ECLIPSE 18X1.5 (NEEDLE) IMPLANT
NDL SPNL 22GX7 QUINCKE BK (NEEDLE) IMPLANT
NEEDLE HYPO 18GX1.5 SHARP (NEEDLE) ×1
NEEDLE SPNL 22GX7 QUINCKE BK (NEEDLE) ×2 IMPLANT
SYR CONTROL 10ML LL (SYRINGE) ×1 IMPLANT
UNDERPAD 30X30 (UNDERPADS AND DIAPERS) ×3 IMPLANT

## 2019-01-02 NOTE — Op Note (Signed)
PATIENT:  Alfred Thompson  PRE-OPERATIVE DIAGNOSIS: Elevated PSA  POST-OPERATIVE DIAGNOSIS: Same  PROCEDURE: 1.  DRE under anesthesia. 2.  Transrectal ultrasound and biopsy of the prostate  SURGEON:  Garnett Farm  INDICATION: Alfred Thompson is a 73 year old male with a PSA of 5.48.  His PSA has been higher in the past however due to consistent rise in his PSA we have discussed proceeding with prostate biopsy.  No rectal examination was performed prior to the procedure.  ANESTHESIA:  General  EBL:  Minimal  DRAINS: None  LOCAL MEDICATIONS USED:  None  SPECIMEN: Prostate biopsy cores sent to pathology.  Description of procedure: After informed consent the patient was taken to the operating room and placed on the table in a supine position. General anesthesia was then administered. Once fully anesthetized the patient was moved to the lateral position.  An official timeout was then performed.  Transrectal ultrasound probe was placed in the rectum and the prostate was visualized from base to apex in the axial and sagittal planes.  I noted an intact capsule.  There was hypertrophy of the transition zone.  I noted no suspicious hyper or hypoechoic lesions.  The prostate was measured and found to be 161 cc.  TRUS/BX was performed using real-time ultrasound and cores were taken from the right base lateral, right base medial, right mid prostate lateral, right mid prostate medial, right apex lateral, right apex medial, left base lateral, left base medial, left mid lateral, left mid medial, left apex lateral and left apex medial.  Each of these cores was sent separately for pathologic evaluation.  There were no immediate complications.  Patient tolerated procedure well.   PLAN OF CARE: Discharge to home after PACU  PATIENT DISPOSITION:  PACU - hemodynamically stable.

## 2019-01-02 NOTE — Anesthesia Procedure Notes (Signed)
Procedure Name: LMA Insertion Date/Time: 01/02/2019 8:30 AM Performed by: Paris Lore, CRNA Pre-anesthesia Checklist: Patient identified, Emergency Drugs available, Suction available, Patient being monitored and Timeout performed Patient Re-evaluated:Patient Re-evaluated prior to induction Oxygen Delivery Method: Circle system utilized Preoxygenation: Pre-oxygenation with 100% oxygen Induction Type: IV induction Ventilation: Mask ventilation without difficulty LMA: LMA inserted LMA Size: 4.0 Number of attempts: 1 Placement Confirmation: positive ETCO2 and breath sounds checked- equal and bilateral Tube secured with: Tape

## 2019-01-02 NOTE — Anesthesia Postprocedure Evaluation (Signed)
Anesthesia Post Note  Patient: Sherwood GamblerRonald C Childers  Procedure(s) Performed: BIOPSY TRANSRECTAL ULTRASONIC PROSTATE (TUBP) (N/A )     Patient location during evaluation: PACU Anesthesia Type: General Level of consciousness: awake and alert Pain management: pain level controlled Vital Signs Assessment: post-procedure vital signs reviewed and stable Respiratory status: spontaneous breathing, nonlabored ventilation, respiratory function stable and patient connected to nasal cannula oxygen Cardiovascular status: blood pressure returned to baseline and stable Postop Assessment: no apparent nausea or vomiting Anesthetic complications: no    Last Vitals:  Vitals:   01/02/19 0945 01/02/19 1015  BP: 130/69 (!) 141/73  Pulse: (!) 59 60  Resp: 16 18  Temp: 36.6 C   SpO2: 95% 97%    Last Pain:  Vitals:   01/02/19 1015  TempSrc:   PainSc: 0-No pain                 Shelton SilvasKevin D Hollis

## 2019-01-02 NOTE — Transfer of Care (Signed)
Immediate Anesthesia Transfer of Care Note  Patient: Alfred Thompson  Procedure(s) Performed: Procedure(s): BIOPSY TRANSRECTAL ULTRASONIC PROSTATE (TUBP) (N/A)  Patient Location: PACU  Anesthesia Type:General  Level of Consciousness:  sedated, patient cooperative and responds to stimulation  Airway & Oxygen Therapy:Patient Spontanous Breathing and Patient connected to face mask oxgen  Post-op Assessment:  Report given to PACU RN and Post -op Vital signs reviewed and stable  Post vital signs:  Reviewed and stable  Last Vitals:  Vitals:   01/02/19 0637  BP: 129/76  Pulse: 62  Resp: 16  Temp: (!) 36.4 C  SpO2: 95%    Complications: No apparent anesthesia complications

## 2019-01-05 ENCOUNTER — Encounter (HOSPITAL_COMMUNITY): Payer: Self-pay | Admitting: Urology

## 2019-01-06 ENCOUNTER — Ambulatory Visit: Payer: PPO | Admitting: Cardiology

## 2019-01-06 ENCOUNTER — Encounter: Payer: Self-pay | Admitting: Cardiology

## 2019-01-06 VITALS — BP 122/76 | HR 70 | Ht 69.0 in | Wt 209.0 lb

## 2019-01-06 DIAGNOSIS — I480 Paroxysmal atrial fibrillation: Secondary | ICD-10-CM

## 2019-01-06 DIAGNOSIS — Z8679 Personal history of other diseases of the circulatory system: Secondary | ICD-10-CM | POA: Diagnosis not present

## 2019-01-06 DIAGNOSIS — I483 Typical atrial flutter: Secondary | ICD-10-CM

## 2019-01-06 DIAGNOSIS — I2510 Atherosclerotic heart disease of native coronary artery without angina pectoris: Secondary | ICD-10-CM

## 2019-01-06 DIAGNOSIS — Z7901 Long term (current) use of anticoagulants: Secondary | ICD-10-CM

## 2019-01-06 DIAGNOSIS — I872 Venous insufficiency (chronic) (peripheral): Secondary | ICD-10-CM

## 2019-01-06 DIAGNOSIS — E785 Hyperlipidemia, unspecified: Secondary | ICD-10-CM

## 2019-01-06 NOTE — Patient Instructions (Signed)
Medication Instructions:   STOP TAKING AMIODARONE   If you need a refill on your cardiac medications before your next appointment, please call your pharmacy.   Lab work: NOT NEEDED If you have labs (blood work) drawn today and your tests are completely normal, you will receive your results only by: Marland Kitchen MyChart Message (if you have MyChart) OR . A paper copy in the mail If you have any lab test that is abnormal or we need to change your treatment, we will call you to review the results.  Testing/Procedures: NOT NEEDED  Follow-Up: At [facility], you and your health needs are our priority.  As part of our continuing mission to provide you with exceptional heart care, we have created designated Provider Care Teams.  These Care Teams include your primary Cardiologist (physician) and Advanced Practice Providers (APPs -  Physician Assistants and Nurse Practitioners) who all work together to provide you with the care you need, when you need it. You will need a follow up appointment in  OCT/NOV 2020.  Please call our office 2 months in advance to schedule this appointment.  You may see DR Herbie Baltimore or one of the following Advanced Practice Providers on your designated Care Team:   Theodore Demark, PA-C . Joni Reining, DNP, ANP  Any Other Special Instructions Will Be Listed Below (If Applicable).

## 2019-01-06 NOTE — Progress Notes (Signed)
PCP: Farris Has, MD  Clinic Note: Chief Complaint  Patient presents with  . Follow-up    Pt states no Sx.   . Coronary Artery Disease  . Atrial Fibrillation  . Edema    Venous stasis, left leg worse than right.  Stable    HPI: KEDEN RISINGER is a 73 y.o. male with a PMH notable for CAD and CABG as well as PAF/Flutter who presents today for ~ 1 month f/u after DCCV in late Nov. .   October 2014 possibly angina: Cath showed left main disease --> CABG x4  Complicated by A. fib RVR.  Initially on amiodarone and Eliquis.  Amiodarone was stopped due to DLCO elevation.  Prior to being seen in November 2019, he was last seen on 03/12/2017 -was doing well no cardiac standpoint.  No chest pain or pressure.  No sensation of A. fib.  No bleeding.  Trying to eat more fruits and vegetables to normalize bowel movements and maintain stable weight.  Mild edema controlled with support stockings.  XAYLEN BREDEHOFT was last seen on Nov 07, 2018 (basely to refill medications and for preop evaluation for prostate biopsy)--> noted to be in Afib/Flutter.  (Had asked about holding Eliquis for a Biopsy) -- wanted to ensure out of Afib/flutter before holding.  -->  Was continued on amiodarone while holding Eliquis.  Now he is back on Eliquis.  Recent Hospitalizations:   11/14/2018 - DCCV  Prostate biopsy 01/02/2019  -- Seen by Corine Shelter, PA following DCCV  Studies Personally Reviewed - (if available, images/films reviewed: From Epic Chart or Care Everywhere)  Myoview Sress Test Feb 2018: : EF 55-65%.  Subtle mild perfusion defect and apical lateral wall.  No reversibility.  Cannot exclude diaphragmatic attenuation.  LOW RISK.  Interval History: Vanderbilt returns today doing quite well.  As usual, he really has no major complaints.  He never did have any sense of atrial fib or flutter, but thinks his energy level may be low but better now that he is in sinus rhythm.  He still wears his support socks every  now and then and says his edema is pretty well controlled.  He tells me that his energy level is fine he walks and rounded as what he wants to do without any chest pain or pressure, and no dyspnea.  No PND or orthopnea.  No irregular rapid heartbeats or palpitations.  No syncope/near syncope or TIA shows amaurosis fugax. No bleeding issues on anticoagulation. No claudication.  Is waiting for the results of his biopsy.  ROS: A comprehensive was performed. Review of Systems  Constitutional: Negative for malaise/fatigue and weight loss.  HENT: Negative for congestion and nosebleeds.   Respiratory: Negative for cough, shortness of breath and wheezing.   Gastrointestinal: Negative for blood in stool and melena.  Genitourinary: Positive for frequency. Negative for dysuria and hematuria.  Musculoskeletal: Negative for back pain and joint pain.  Neurological: Negative for dizziness, focal weakness and weakness.  Psychiatric/Behavioral: The patient is not nervous/anxious.     I have reviewed and (if needed) personally updated the patient's problem list, medications, allergies, past medical and surgical history, social and family history.   Past Medical History:  Diagnosis Date  . Back pain, chronic    Resolved  . First degree AV block 12/09/2018   Noted on EKG  . History of atrial flutter   . History of bradycardia   . History of cardiomegaly 05/1999  . Hyperlipidemia   .  Left main coronary artery disease 10/09/2013   Distal Left Main 80-90%; ostial/proximal LAD 50-80%, proximal RI 80%, proximal RCA napkin ring 80-90%; relatively normal Circumflex/Lateral OM --> referred for CABG; 2-D echo March 2015: EF 55-60% with no regional wall motion on his  . Obesity   . Obesity (BMI 30-39.9)   . Paroxysmal A-fib (HCC) 10/09/2013   Admitted with Afib RVR, 2 episodes post-op.  . Pleural effusion, left 09/2013  . S/P CABG x 4 10/10/2013   (LIMA to LAD, SVG to OM, SVG to RAMUS INTERMEDIATE, and SVG  to RCA  . Unstable angina (HCC) 10/09/2013  . Venous insufficiency    Left leg    Past Surgical History:  Procedure Laterality Date  . CARDIOVERSION N/A 11/14/2018   Procedure: CARDIOVERSION;  Surgeon: Quintella Reicherturner, Traci R, MD;  [facility];  Service: Cardiovascular;  Laterality: N/A;  . CORONARY ARTERY BYPASS GRAFT N/A 10/10/2013   Procedure: CORONARY ARTERY BYPASS GRAFTING (CABG);  Surgeon: Alleen BorneBryan K Bartle, MD;  [facility];  Service: Open Heart Surgery;  Laterality: N/A;  Times 4 using left internal mammary artery and endoscopically harvested left saphenous vein  . DUPUYTREN / PALMAR FASCIOTOMY Bilateral   . HERNIA REPAIR    . INTRAOPERATIVE TRANSESOPHAGEAL ECHOCARDIOGRAM N/A 10/10/2013   Procedure: INTRAOPERATIVE TRANSESOPHAGEAL ECHOCARDIOGRAM;  Surgeon: Alleen BorneBryan K Bartle, MD;  [facility];  Service: Open Heart Surgery;  Laterality: N/A;  . LEFT HEART CATHETERIZATION WITH CORONARY ANGIOGRAM N/A 10/09/2013   Procedure: LEFT HEART CATHETERIZATION WITH CORONARY ANGIOGRAM;  Surgeon: Marykay Lexavid W , MD;  [facility];  Distal Left Main 80-90%; ostial/proximal LAD 50-80%, proximal RI 80%, proximal RCA napkin ring 80-90%; relatively normal Circumflex/Lateral OM --> referred for CABG  . NM MYOVIEW LTD  01/2017   EF 55-65%.  Subtle mild perfusion defect and apical lateral wall.  No reversibility.  Cannot exclude diaphragmatic attenuation.  LOW RISK.  Marland Kitchen. PROSTATE BIOPSY N/A 01/02/2019   Procedure: BIOPSY TRANSRECTAL ULTRASONIC PROSTATE (TUBP);  Surgeon: Ihor Gullyttelin, Mark, MD;  Location: [facility] ORS;  Service: Urology;  Laterality: N/Audie Box;   Myoview Sress Test Feb 2018: : EF 55-65%.  Subtle mild perfusion defect and apical lateral wall.  No reversibility.  Cannot exclude diaphragmatic attenuation.  LOW RISK.  No outpatient medications have been marked as taking for the 01/06/19 encounter (Office Visit) with Marykay Lex,  W, MD.    No Known Allergies  Social History   Tobacco Use  .  Smoking status: Never Smoker  . Smokeless tobacco: Never Used  Substance Use Topics  . Alcohol use: Yes    Alcohol/week: 0.0 standard drinks    Comment: occasional  . Drug use: No   Social History   Social History Narrative   Married, one son, no grandchildren. He drives for [employer]    family history includes Coronary artery disease (age of onset: 2770) in his father; Rectal cancer in his mother; Transient ischemic attack in his mother.  Wt Readings from Last 3 Encounters:  01/06/19 209 lb (94.8 kg)  01/02/19 210 lb 4 oz (95.4 kg)  12/31/18 210 lb 4 oz (95.4 kg)    PHYSICAL EXAM BP 122/76   Pulse 70   Ht 5\' 9"  (1.753 m)   Wt 209 lb (94.8 kg)   BMI 30.86 kg/m  Physical Exam  Constitutional: He is oriented to person, place, and time. He appears well-developed and well-nourished. No distress.  Healthy-appearing.  Well-groomed  HENT:  Head: Normocephalic and atraumatic.  Neck: Normal  range of motion. Neck supple. No hepatojugular reflux and no JVD present. Carotid bruit is not present.  Cardiovascular: Normal rate, regular rhythm, normal heart sounds and intact distal pulses.  No extrasystoles are present. PMI is not displaced. Exam reveals no gallop and no friction rub.  No murmur heard. Pulmonary/Chest: Effort normal and breath sounds normal. No respiratory distress. He has no wheezes.  Abdominal: Soft. Bowel sounds are normal. He exhibits no distension. There is no abdominal tenderness.  No HSM  Musculoskeletal: Normal range of motion.        General: Edema (trivial LLE) present.  Neurological: He is alert and oriented to person, place, and time.  Psychiatric: He has a normal mood and affect. His behavior is normal. Judgment and thought content normal.  Vitals reviewed.   Adult ECG Report  Rate: 70;  Rhythm: normal sinus rhythm, sinus arrhythmia and Cannot exclude inferior MI, age undetermined.  T wave inversion no longer present.;  Narrative Interpretation:  Now back in sinus rhythm.  Other studies Reviewed: Additional studies/ records that were reviewed today include:  Recent Labs:   Lab Results  Component Value Date   CHOL 94 (L) 11/10/2018   HDL 38 (L) 11/10/2018   LDLCALC 42 11/10/2018   TRIG 69 11/10/2018   CHOLHDL 2.5 11/10/2018   Lab Results  Component Value Date   CREATININE 1.14 12/31/2018   BUN 14 12/31/2018   NA 144 12/31/2018   K 4.5 12/31/2018   CL 108 12/31/2018   CO2 30 12/31/2018   ASSESSMENT / PLAN: Problem List Items Addressed This Visit    Chronic anticoagulation, for PAF with eliquis (Chronic)    CHADS VASC=2.  No bleeding.      Coronary artery disease involving native coronary artery of native heart without angina pectoris (Chronic)    Has continued to do well since CABG.  Has not had any further anginal symptoms.  Remains stable.  CABG was in 2014, negative Myoview in 2018.  May be some mild diaphragmatic attenuation but nothing significant. No anginal symptoms. Remains on beta-blocker and atorvastatin along with Eliquis.  Not on aspirin because of Eliquis.  Not requiring nitrates.  Blood pressure not adequate for ACE inhibitor or ARB.      History of unstable angina (Chronic)    No further episodes since CABG.  Not requiring anything besides beta-blocker.  Negative Myoview in 2018.      Hyperlipidemia LDL goal <70 (Chronic)   PAF (paroxysmal atrial fibrillation) (HCC) (Chronic)    No recurrence of A. fib, but did have flutter.  Will need to monitor now for recurrence. Okay to discontinue amiodarone.  Continue Eliquis.  This patients CHA2DS2-VASc Score and unadjusted Ischemic Stroke Rate (% per year) is equal to 2.2 % stroke rate/year from a score of 2  Above score calculated as 1 point each if present [CHF, HTN, DM, Vascular=MI/PAD/Aortic Plaque, Age if 65-74, or Male] Above score calculated as 2 points each if present [Age > 75, or Stroke/TIA/TE]       Relevant Orders   EKG 12-Lead  (Completed)   Typical atrial flutter (HCC) - Primary (Chronic)    Interestingly, at the time of his MI, he had atrial fibrillation with RVR, but the last evaluation clearly showed atrial flutter.  Successfully cardioverted and maintained on amiodarone while he had his biopsy.  At this point now we can stop the amiodarone and go back to his baseline rate control with metoprolol. Continue Eliquis.  If he has recurrence  of A. fib, he seemed to be relatively easy to cardiovert.  We discussed that is a better option then maintaining him on rhythm control.      Venous insufficiency of left leg    Well-controlled with foot elevation and support socks.  Not requiring diuretic.         I spent a total of 20minutes with the patient and chart review. >  50% of the time was spent in direct patient consultation.   Current medicines are reviewed at length with the patient today.  (+/- concerns) = none The following changes have been made:  see below.  Patient Instructions  Medication Instructions:   STOP TAKING AMIODARONE   If you need a refill on your cardiac medications before your next appointment, please call your pharmacy.   Lab work: NOT NEEDED If you have labs (blood work) drawn today and your tests are completely normal, you will receive your results only by: Marland Kitchen. MyChart Message (if you have MyChart) OR . A paper copy in the mail If you have any lab test that is abnormal or we need to change your treatment, we will call you to review the results.  Testing/Procedures: NOT NEEDED  Follow-Up: At [facility], you and your health needs are our priority.  As part of our continuing mission to provide you with exceptional heart care, we have created designated Provider Care Teams.  These Care Teams include your primary Cardiologist (physician) and Advanced Practice Providers (APPs -  Physician Assistants and Nurse Practitioners) who all work together to provide you with the care you need,  when you need it. You will need a follow up appointment in  OCT/NOV 2020.  Please call our office 2 months in advance to schedule this appointment.  You may see DR Herbie BaltimoreHARDING or one of the following Advanced Practice Providers on your designated Care Team:   Theodore DemarkRhonda Barrett, PA-C . Joni ReiningKathryn Lawrence, DNP, ANP  Any Other Special Instructions Will Be Listed Below (If Applicable).       Studies Ordered:   Orders Placed This Encounter  Procedures  . EKG 12-Lead      Bryan Lemmaavid , M.D., M.S. Interventional Cardiologist   Pager # [phone] Phone # [phone] [address] [place], [place] [address]   Thank you for choosing [facility] at [facility]!!

## 2019-01-09 ENCOUNTER — Encounter: Payer: Self-pay | Admitting: Cardiology

## 2019-01-09 NOTE — Assessment & Plan Note (Signed)
No recurrence of A. fib, but did have flutter.  Will need to monitor now for recurrence. Okay to discontinue amiodarone.  Continue Eliquis.  This patients CHA2DS2-VASc Score and unadjusted Ischemic Stroke Rate (% per year) is equal to 2.2 % stroke rate/year from a score of 2  Above score calculated as 1 point each if present [CHF, HTN, DM, Vascular=MI/PAD/Aortic Plaque, Age if 65-74, or Male] Above score calculated as 2 points each if present [Age > 75, or Stroke/TIA/TE]

## 2019-01-09 NOTE — Assessment & Plan Note (Signed)
No further episodes since CABG.  Not requiring anything besides beta-blocker.  Negative Myoview in 2018.

## 2019-01-09 NOTE — Assessment & Plan Note (Signed)
Has continued to do well since CABG.  Has not had any further anginal symptoms.  Remains stable.  CABG was in 2014, negative Myoview in 2018.  May be some mild diaphragmatic attenuation but nothing significant. No anginal symptoms. Remains on beta-blocker and atorvastatin along with Eliquis.  Not on aspirin because of Eliquis.  Not requiring nitrates.  Blood pressure not adequate for ACE inhibitor or ARB.

## 2019-01-09 NOTE — Assessment & Plan Note (Signed)
CHADS VASC=2.  No bleeding.

## 2019-01-09 NOTE — Assessment & Plan Note (Signed)
Well-controlled with foot elevation and support socks.  Not requiring diuretic.

## 2019-01-09 NOTE — Assessment & Plan Note (Signed)
Interestingly, at the time of his MI, he had atrial fibrillation with RVR, but the last evaluation clearly showed atrial flutter.  Successfully cardioverted and maintained on amiodarone while he had his biopsy.  At this point now we can stop the amiodarone and go back to his baseline rate control with metoprolol. Continue Eliquis.  If he has recurrence of A. fib, he seemed to be relatively easy to cardiovert.  We discussed that is a better option then maintaining him on rhythm control.

## 2019-01-15 DIAGNOSIS — R972 Elevated prostate specific antigen [PSA]: Secondary | ICD-10-CM | POA: Diagnosis not present

## 2019-03-06 ENCOUNTER — Other Ambulatory Visit: Payer: Self-pay | Admitting: Cardiology

## 2019-03-09 ENCOUNTER — Other Ambulatory Visit: Payer: Self-pay | Admitting: Cardiology

## 2019-05-27 ENCOUNTER — Other Ambulatory Visit: Payer: Self-pay | Admitting: Cardiology

## 2019-07-16 DIAGNOSIS — R972 Elevated prostate specific antigen [PSA]: Secondary | ICD-10-CM | POA: Diagnosis not present

## 2019-09-28 ENCOUNTER — Telehealth: Payer: Self-pay | Admitting: *Deleted

## 2019-09-28 NOTE — Telephone Encounter (Signed)
A message was left, re: his follow up visit. 

## 2019-12-01 ENCOUNTER — Other Ambulatory Visit: Payer: Self-pay | Admitting: Cardiology

## 2019-12-10 ENCOUNTER — Other Ambulatory Visit: Payer: Self-pay

## 2019-12-10 ENCOUNTER — Other Ambulatory Visit: Payer: Self-pay | Admitting: Cardiology

## 2019-12-10 ENCOUNTER — Ambulatory Visit (INDEPENDENT_AMBULATORY_CARE_PROVIDER_SITE_OTHER): Payer: PPO | Admitting: Cardiology

## 2019-12-10 VITALS — BP 128/62 | HR 61 | Temp 97.3°F | Ht 69.0 in | Wt 210.0 lb

## 2019-12-10 DIAGNOSIS — R001 Bradycardia, unspecified: Secondary | ICD-10-CM

## 2019-12-10 DIAGNOSIS — I2510 Atherosclerotic heart disease of native coronary artery without angina pectoris: Secondary | ICD-10-CM

## 2019-12-10 DIAGNOSIS — E785 Hyperlipidemia, unspecified: Secondary | ICD-10-CM | POA: Diagnosis not present

## 2019-12-10 DIAGNOSIS — I872 Venous insufficiency (chronic) (peripheral): Secondary | ICD-10-CM | POA: Diagnosis not present

## 2019-12-10 DIAGNOSIS — Z7901 Long term (current) use of anticoagulants: Secondary | ICD-10-CM

## 2019-12-10 DIAGNOSIS — Z951 Presence of aortocoronary bypass graft: Secondary | ICD-10-CM | POA: Diagnosis not present

## 2019-12-10 DIAGNOSIS — I480 Paroxysmal atrial fibrillation: Secondary | ICD-10-CM

## 2019-12-10 NOTE — Patient Instructions (Signed)

## 2019-12-10 NOTE — Progress Notes (Signed)
Primary Care Provider: Farris Has, MD Cardiologist: Bryan Lemma, MD Electrophysiologist:   Clinic Note: Chief Complaint  Patient presents with  . Follow-up    Doing well from a cardiac standpoint.,  Lots of social issues  . Coronary Artery Disease    No real angina  . Atrial Fibrillation    No recurrence since his cardioversion in November 2019    HPI:    Alfred Thompson is a 73 y.o. male with a PMH notable for CAD-MI in the setting of A. fib/flutter with rapid rate (multivessel CAD referred for CABG) who presents today for 24-month follow-up.  Alfred Thompson was last seen on January 06, 2019 for 1 month follow-up after DCCV in late November 2019.  He had noted to be in atrial fib/flutter back in November 2019.  We used amiodarone to facilitate cardioversion, and then after 1 month he was able to hold his Eliquis while on amiodarone to have prostate biopsy done (January 02, 2019).  Was back on Eliquis when I saw him.  Recent Hospitalizations: None since his biopsy in January  Reviewed  CV studies:    The following studies were reviewed today: (if available, images/films reviewed: From Epic Chart or Care Everywhere) . None since Myoview in 2018:   Interval History:   Alfred Thompson returns today for annual follow-up doing really well.  He has not had any symptoms to suggest A. fib.  Does not appear to be in A. fib on exam.  He is not having any resting exertional angina symptoms.  No heart failure symptoms.  He is remaining very active although quite frustrated by the restrictions on gym etc. during the Covid lockdown and current restrictions.  He is very very fearful of potentially contracting COVID-19 and has been very distant.  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  He is adamant that this is not a safe practice.  A good portion of our visit today was based on discussion about his brothers and wife having PE.--See social history below. Ron spent most of the  time talking about this combination of events as well as the COVID-19 concerns.   CV Review of Symptoms (Summary): no chest pain or dyspnea on exertion positive for - edema and This is extremely well controlled with compression socks negative for - irregular heartbeat, orthopnea, palpitations, paroxysmal nocturnal dyspnea, rapid heart rate, shortness of breath or Syncope/near syncope, TIA/amaurosis fugax.  Claudication.  The patient does not have symptoms concerning for COVID-19 infection (fever, chills, cough, or new shortness of breath).  The patient is practicing social distancing. ++ Masking.  He has to go out for groceries/shopping, but is very careful with distancing, mask wearing and hand hygiene.  He actually uses gloves.  He tries to avoid crowds. 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   REVIEWED OF SYSTEMS   A comprehensive ROS was performed. Review of Systems  Constitutional: Negative for malaise/fatigue and weight loss.  HENT: Negative for congestion, nosebleeds and sinus pain.   Respiratory: Negative for cough and shortness of breath.   Cardiovascular: Negative for leg swelling.  Gastrointestinal: Negative for blood in stool, constipation, heartburn and melena.  Genitourinary: Negative for hematuria.  Musculoskeletal: Positive for back pain. Negative for falls and joint pain.  Neurological: Negative for dizziness, weakness and headaches.  Endo/Heme/Allergies: Negative for environmental allergies. Does not bruise/bleed easily.  Psychiatric/Behavioral: Negative for memory loss. The patient is not nervous/anxious and does not  have insomnia.        A little overwhelmed still with his brother dying followed by his wife's illness.  Also very upset about COVID-19 and what is done to social situations.    I have reviewed and (if needed) personally updated the patient's problem list, medications, allergies, past medical and surgical history, social and family history.    PAST MEDICAL HISTORY   Past Medical History:  Diagnosis Date  . Back pain, chronic    Resolved  . First degree AV block 12/09/2018   Noted on EKG  . Hyperlipidemia    on statin  . Left main coronary artery disease 10/09/2013   Distal Left Main 80-90%; ostial/proximal LAD 50-80%, proximal RI 80%, proximal RCA napkin ring 80-90%; relatively normal Circumflex/Lateral OM --> referred for CABG; 2-D echo March 2015: EF 55-60% with no regional wall motion on his  . Obesity (BMI 30-39.9)   . Paroxysmal A-fib (HCC) 10/09/2013   Admitted with Afib RVR, 2 episodes post-op. -Has some strips that looks like atrial flutter  . Pleural effusion, left 09/2013  . S/P CABG x 4 10/10/2013   (LIMA to LAD, SVG to OM, SVG to RAMUS INTERMEDIATE, and SVG to RCA  . Unstable angina (HCC) 10/09/2013  . Venous insufficiency    Left leg     PAST SURGICAL HISTORY   Past Surgical History:  Procedure Laterality Date  . CARDIOVERSION N/A 11/14/2018   Procedure: CARDIOVERSION;  Surgeon: Quintella Reichert, MD;  Location: [facility] ENDOSCOPY;  Service: Cardiovascular;  Laterality: N/A;  . CORONARY ARTERY BYPASS GRAFT N/A 10/10/2013   Procedure: CORONARY ARTERY BYPASS GRAFTING (CABG);  Surgeon: Alleen Borne, MD;  Location: [facility] OR;  Service: Open Heart Surgery;  Laterality: N/A;  Times 4 using left internal mammary artery and endoscopically harvested left saphenous vein  . DUPUYTREN / PALMAR FASCIOTOMY Bilateral   . HERNIA REPAIR    . INTRAOPERATIVE TRANSESOPHAGEAL ECHOCARDIOGRAM N/A 10/10/2013   Procedure: INTRAOPERATIVE TRANSESOPHAGEAL ECHOCARDIOGRAM;  Surgeon: Alleen Borne, MD;  Location: [facility] OR;  Service: Open Heart Surgery;  Laterality: N/A;  . LEFT HEART CATHETERIZATION WITH CORONARY ANGIOGRAM N/A 10/09/2013   Procedure: LEFT HEART CATHETERIZATION WITH CORONARY ANGIOGRAM;  Surgeon: Marykay Lex, MD;  Location: [facility] CATH LAB;  Distal Left Main 80-90%; ostial/proximal LAD 50-80%, proximal RI 80%, proximal RCA napkin  ring 80-90%; relatively normal Circumflex/Lateral OM --> referred for CABG  . NM MYOVIEW LTD  01/2017   EF 55-65%.  Subtle mild perfusion defect and apical lateral wall.  No reversibility.  Cannot exclude diaphragmatic attenuation.  LOW RISK.  Marland Kitchen PROSTATE BIOPSY N/A 01/02/2019   Procedure: BIOPSY TRANSRECTAL ULTRASONIC PROSTATE (TUBP);  Surgeon: Ihor Gully, MD;  Location: [facility] ORS;  Service: Urology;  Laterality: N/A;    MEDICATIONS/ALLERGIES   Current Meds  Medication Sig  . atorvastatin (LIPITOR) 40 MG tablet TAKE 1 TABLET BY MOUTH ONCE DAILY AT 6:00PM  . ELIQUIS 5 MG TABS tablet Take 1 tablet by mouth twice daily  . metoprolol succinate (TOPROL-XL) 25 MG 24 hr tablet Take 1 tablet by mouth once daily  . potassium chloride (KLOR-CON) 10 MEQ tablet Take 1 tablet by mouth once daily    No Known Allergies   SOCIAL HISTORY/FAMILY HISTORY   Social History   Tobacco Use  . Smoking status: Never Smoker  . Smokeless tobacco: Never Used  Substance Use Topics  . Alcohol use: Yes    Alcohol/week: 0.0 standard drinks    Comment: occasional  . Drug  use: No   Social History   Social History Narrative   Married, one son, no grandchildren.    He drives for [employer]      -> He frequently mentions that he was a former middle distance runner up until maybe a year or so before his MI.      December 2020: Continues to stay active walking in the neighborhood, but not doing regular exercise.  His usual exercise routine is curtailed by COVID-19, but he says he walks about a half a mile to a mile a day.  When he comes to he will, he actually tries to increase his speed going up it to try to challenge himself.   We spent about 15 out of the 25 minutes with the patient talking about her recent troubles and events in his life:  He had a brother (whom he did not necessarily keep in touch with, the brother was deaf and had his own social scene that did not meld well with Windy Fastonald) -> the  brother died on August 3 likely from an MI.  However he did not find about it until a few weeks later (the authorities were able to get hold with his aunt who finally let him know.  Today he found out about his brother dying, while on the phone with his aunt getting the information, his wife was having significant respiratory issues was diaphoretic and eventually went to the emergency room, found to have large pulmonary embolus.  Was treated with heparin and thrombolytics.  She spent a few days in the ICU.  Is now recovering well, but he was very distressed distracted by that.  The combination of his wife's illness and his brother died was overwhelming.  He was able to get a lawyer to help out with the work on his brothers estate.  He also notes that one of his past times is working as a DJ, and he says that he is well aware of several friends and colleagues who are still going to these large parties with DJs.  He actually has seen video of people in large groups without masks on and there has been large outbreaks of COVID-19 because of this.  These issues have got him very stressed, he almost was not interested in talking about his cardiac health.  Family History family history includes Coronary artery disease (age of onset: 4470) in his father; Rectal cancer in his mother; Transient ischemic attack in his mother.   OBJCTIVE -PE, EKG, labs   Wt Readings from Last 3 Encounters:  12/10/19 210 lb (95.3 kg)  01/06/19 209 lb (94.8 kg)  01/02/19 210 lb 4 oz (95.4 kg)    Physical Exam: BP 128/62 (BP Location: Left Arm, Patient Position: Sitting, Cuff Size: Normal)   Pulse 61   Temp (!) 97.3 F (36.3 C)   Ht 5\' 9"  (1.753 m)   Wt 210 lb (95.3 kg)   BMI 31.01 kg/m  Physical Exam  Constitutional: He is oriented to person, place, and time. He appears well-developed and well-nourished. No distress.  Healthy-appearing.  Well-groomed.  HENT:  Head: Normocephalic and atraumatic.  Neck: No  hepatojugular reflux and no JVD present. Carotid bruit is not present. No tracheal deviation present. No thyromegaly present.  Cardiovascular: Normal rate, regular rhythm, normal heart sounds and intact distal pulses.  No extrasystoles are present. PMI is not displaced. Exam reveals no gallop and no friction rub.  No murmur heard. Pulmonary/Chest: Effort normal and breath sounds  normal. No respiratory distress. He has no wheezes. He has no rales.  Abdominal: Soft. Bowel sounds are normal. He exhibits no distension. There is no abdominal tenderness. There is no rebound.  No HSM.  Mild obesity  Musculoskeletal:        General: No edema. Normal range of motion.     Cervical back: Normal range of motion and neck supple.  Neurological: He is alert and oriented to person, place, and time.  Psychiatric: His behavior is normal. Judgment and thought content normal.  He was in a very pressured almost depressed mood today.  Was very hard to keep on target.  He spent a lot of time talking about social situations and issues.  Vitals reviewed.   Adult ECG Report -Not checked  Recent Labs: He is due for labs.  He thinks he is due to get labs checked by PCP Lab Results  Component Value Date   CHOL 94 (L) 11/10/2018   HDL 38 (L) 11/10/2018   LDLCALC 42 11/10/2018   TRIG 69 11/10/2018   CHOLHDL 2.5 11/10/2018   Lab Results  Component Value Date   CREATININE 1.14 12/31/2018   BUN 14 12/31/2018   NA 144 12/31/2018   K 4.5 12/31/2018   CL 108 12/31/2018   CO2 30 12/31/2018    ASSESSMENT/PLAN    Problem List Items Addressed This Visit    PAF (paroxysmal atrial fibrillation) (HCC): CHA2DS2Vasc = 3 (age, HTN, CAD). on Eliquis - Primary (Chronic)    Last documented A. fib/flutter was in November 2019.  He has no sensation of whether he is in or not in a flutter.  We stopped amiodarone, continue Toprol which is showing adequate rate control.  Would not go titrate further because of heart rate 61  history of bradycardia. Anticoagulated with Eliquis and no problems.  This patients CHA2DS2-VASc Score and unadjusted Ischemic Stroke Rate (% per year) is equal to 3.2 % stroke rate/year from a score of 3  Above score calculated as 1 point each if present [CHF, HTN, DM, Vascular=MI/PAD/Aortic Plaque, Age if 65-74, or Male] Above score calculated as 2 points each if present [Age > 75, or Stroke/TIA/TE]        Coronary artery disease involving native coronary artery of native heart without angina pectoris (Chronic)    Had multivessel native disease mostly left main.  Doing well post CABG no angina.  Very active stable. He had negative Myoview 2018 with maybe some mild diaphragmatic attenuation. In the absence of symptoms.  No further evaluation at this point. Is on beta-blocker and statin along with Eliquis.  Not on aspirin because of Eliquis.      S/P CABG x 4 (Chronic)    Mild low risk Myoview in February 2018.  Would not be due for stress test again until February 2022      Chronic anticoagulation, for PAF with eliquis (Chronic)    CHA2DS2-VASc score is 3:, age 21, vascular disease, hypertension      Hyperlipidemia LDL goal <70 (Chronic)    Labs been followed by PCP.  Previously pretty well controlled.  Should be due to get labs checked.  As of last year (November 2019) LDL was 42.  Would like to see the labs from his PCP.  He is on atorvastatin 40 mg.  Would not change for now.      Bradycardia with 41-50 beats per minute    Has some mild baseline bradycardia while on amiodarone, however current heart rate is  stable.  Not able to titrate Toprol anymore, and would not double treat with amiodarone and beta-blocker..        Venous insufficiency of left leg    Well-controlled swelling with support/compression socks.  Not requiring any diuretic.          COVID-19 Education: The signs and symptoms of COVID-19 were discussed with the patient and how to seek care for  testing (follow up with PCP or arrange E-visit).   The importance of social distancing was discussed today.  I spent a total of 20minutes with the patient and chart review. >  50% of the time was spent in direct patient consultation.  Additional time spent with chart review (studies, outside notes, etc): 6 Total Time: 57min  Current medicines are reviewed at length with the patient today.  (+/- concerns) n/a   Patient Instructions / Medication Changes & Studies & Tests Ordered   Patient Instructions  Medication Instructions:  No changes *If you need a refill on your cardiac medications before your next appointment, please call your pharmacy*  Lab Work: Not needed   Testing/Procedures: Not needed  Follow-Up: At [facility], you and your health needs are our priority.  As part of our continuing mission to provide you with exceptional heart care, we have created designated Provider Care Teams.  These Care Teams include your primary Cardiologist (physician) and Advanced Practice Providers (APPs -  Physician Assistants and Nurse Practitioners) who all work together to provide you with the care you need, when you need it.  Your next appointment:   12 month(s)  The format for your next appointment:   In Person  Provider:   Glenetta Hew, MD  Other Instructions     Studies Ordered:   No orders of the defined types were placed in this encounter.    Glenetta Hew, M.D., M.S. Interventional Cardiologist   Pager # [phone] Phone # [phone] [address]. [place], [place] [address]   Thank you for choosing Heartcare at [facility]!!

## 2019-12-12 ENCOUNTER — Encounter: Payer: Self-pay | Admitting: Cardiology

## 2019-12-12 NOTE — Assessment & Plan Note (Signed)
Has some mild baseline bradycardia while on amiodarone, however current heart rate is stable.  Not able to titrate Toprol anymore, and would not double treat with amiodarone and beta-blocker.Marland Kitchen

## 2019-12-12 NOTE — Assessment & Plan Note (Signed)
Labs been followed by PCP.  Previously pretty well controlled.  Should be due to get labs checked.  As of last year (November 2019) LDL was 42.  Would like to see the labs from his PCP.  He is on atorvastatin 40 mg.  Would not change for now.

## 2019-12-12 NOTE — Assessment & Plan Note (Signed)
Mild low risk Myoview in February 2018.  Would not be due for stress test again until February 2022

## 2019-12-12 NOTE — Assessment & Plan Note (Signed)
Last documented A. fib/flutter was in November 2019.  He has no sensation of whether he is in or not in a flutter.  We stopped amiodarone, continue Toprol which is showing adequate rate control.  Would not go titrate further because of heart rate 61 history of bradycardia. Anticoagulated with Eliquis and no problems.  This patients CHA2DS2-VASc Score and unadjusted Ischemic Stroke Rate (% per year) is equal to 3.2 % stroke rate/year from a score of 3  Above score calculated as 1 point each if present [CHF, HTN, DM, Vascular=MI/PAD/Aortic Plaque, Age if 65-74, or Male] Above score calculated as 2 points each if present [Age > 75, or Stroke/TIA/TE]

## 2019-12-12 NOTE — Assessment & Plan Note (Signed)
CHA2DS2-VASc score is 3:, age 73, vascular disease, hypertension

## 2019-12-12 NOTE — Assessment & Plan Note (Signed)
Had multivessel native disease mostly left main.  Doing well post CABG no angina.  Very active stable. He had negative Myoview 2018 with maybe some mild diaphragmatic attenuation. In the absence of symptoms.  No further evaluation at this point. Is on beta-blocker and statin along with Eliquis.  Not on aspirin because of Eliquis.

## 2019-12-12 NOTE — Assessment & Plan Note (Addendum)
Well-controlled swelling with support/compression socks.  Not requiring any diuretic.

## 2020-01-07 DIAGNOSIS — R972 Elevated prostate specific antigen [PSA]: Secondary | ICD-10-CM | POA: Diagnosis not present

## 2020-03-10 ENCOUNTER — Other Ambulatory Visit: Payer: Self-pay | Admitting: Cardiology

## 2020-03-21 ENCOUNTER — Other Ambulatory Visit: Payer: Self-pay | Admitting: Cardiology

## 2020-04-06 DIAGNOSIS — R972 Elevated prostate specific antigen [PSA]: Secondary | ICD-10-CM | POA: Diagnosis not present

## 2020-04-19 ENCOUNTER — Telehealth: Payer: Self-pay | Admitting: Cardiology

## 2020-04-19 DIAGNOSIS — Z951 Presence of aortocoronary bypass graft: Secondary | ICD-10-CM

## 2020-04-19 DIAGNOSIS — E785 Hyperlipidemia, unspecified: Secondary | ICD-10-CM

## 2020-04-19 DIAGNOSIS — I2510 Atherosclerotic heart disease of native coronary artery without angina pectoris: Secondary | ICD-10-CM

## 2020-04-19 NOTE — Telephone Encounter (Signed)
Alfred Thompson with [facility] called and reports patient told her Dr. Herbie Baltimore told him to take 1/2 of his Atorvastatin 40mg  dose. Reviewed chart and at office visit in December no changes were made. Dr. January note mentioned that PCP was monitoring lipids. Informed Alfred Thompson that PCP office may have made the change but per our system no changes have been made. Alfred Thompson stated office needs to call patient and inform him of current dose. Will route to MD and primary nurse to review and to determine who is dosing atorvastatin.

## 2020-04-19 NOTE — Telephone Encounter (Signed)
  Anna with [facility] is calling because Dr Herbie Baltimore prescribed atorvastatin (LIPITOR) 40 MG tablet for the patient and instructed him to take 1/2 the prescribed dosage. Tobi Bastos is asking that Dr Herbie Baltimore write a new prescription with the updated instructions to go to [facility], [place] - [address]. Patient does not need to refill at this time.

## 2020-04-20 MED ORDER — ATORVASTATIN CALCIUM 20 MG PO TABS: 20.0000 mg | ORAL_TABLET | Freq: Every day | ORAL | 3 refills | Status: DC

## 2020-04-20 NOTE — Telephone Encounter (Signed)
Reviewed  Patient Epic -CHL -   medication changed in 2018  To 1/2 tablet of  40 mg atorvastatin per lab result note    new prescription will be sent to pharmacy to be place on file. And prescription  For lipid , hepatic level orderd   spoke wife . Patient unavailable - informed Mrs Caba of new prescription  Being sent to pharmacy and will be mailing  Labslip. She states patient  has not had lab work done for primary this year.

## 2020-04-20 NOTE — Telephone Encounter (Signed)
I do not remember that phone call. My hope was to reduce to 20 mg, but I do not recall formally changing this dose -- this would have been recorded in Epic if I did.  - no record.    I still do not have labs from PCP>   During his last visit, what I wanted to see was his follow-up labs with PCP.  My comment to him was that if his LDL was still way down in the 40s, we can probably reduce his atorvastatin to 20 mg daily.  His PCP was him on checking his labs.  If he is already taking 20 mg that is fine, I just want to see what his labs look like.  If he has not had labs checked by PCP in the last 6 months, we should just have labs ordered -> CMP and lipids.Bryan Lemma, MD

## 2020-04-21 ENCOUNTER — Other Ambulatory Visit: Payer: Self-pay | Admitting: *Deleted

## 2020-04-21 DIAGNOSIS — E785 Hyperlipidemia, unspecified: Secondary | ICD-10-CM

## 2020-04-21 DIAGNOSIS — Z951 Presence of aortocoronary bypass graft: Secondary | ICD-10-CM

## 2020-04-21 DIAGNOSIS — I2510 Atherosclerotic heart disease of native coronary artery without angina pectoris: Secondary | ICD-10-CM

## 2020-04-27 DIAGNOSIS — I4891 Unspecified atrial fibrillation: Secondary | ICD-10-CM | POA: Diagnosis not present

## 2020-04-27 DIAGNOSIS — R972 Elevated prostate specific antigen [PSA]: Secondary | ICD-10-CM | POA: Diagnosis not present

## 2020-04-27 DIAGNOSIS — I10 Essential (primary) hypertension: Secondary | ICD-10-CM | POA: Diagnosis not present

## 2020-04-27 DIAGNOSIS — E785 Hyperlipidemia, unspecified: Secondary | ICD-10-CM | POA: Diagnosis not present

## 2020-04-27 DIAGNOSIS — Z1211 Encounter for screening for malignant neoplasm of colon: Secondary | ICD-10-CM | POA: Diagnosis not present

## 2020-06-29 ENCOUNTER — Other Ambulatory Visit: Payer: Self-pay

## 2020-06-29 MED ORDER — METOPROLOL SUCCINATE ER 25 MG PO TB24: 25.0000 mg | ORAL_TABLET | Freq: Every day | ORAL | 1 refills | Status: DC

## 2020-06-29 NOTE — Telephone Encounter (Signed)
Rx(s) sent to pharmacy electronically.  

## 2020-10-11 ENCOUNTER — Other Ambulatory Visit: Payer: Self-pay | Admitting: Cardiology

## 2020-10-11 NOTE — Telephone Encounter (Signed)
*  STAT* If patient is at the pharmacy, call can be transferred to refill team.   1. Which medications need to be refilled? (please list name of each medication and dose if known) new prescription fo Atorvastatin- changing pharmacy   2. Which pharmacy/location (including street and city if local pharmacy) is medication to be sent to? [facility]- [place]  3. Do they need a 30 day or 90 day supply? 90 days and refills

## 2020-10-12 MED ORDER — ATORVASTATIN CALCIUM 20 MG PO TABS: 20.0000 mg | ORAL_TABLET | Freq: Every day | ORAL | 0 refills | Status: DC

## 2020-10-20 ENCOUNTER — Other Ambulatory Visit: Payer: Self-pay | Admitting: Cardiology

## 2020-10-20 NOTE — Telephone Encounter (Signed)
*  STAT* If patient is at the pharmacy, call can be transferred to refill team.   1. Which medications need to be refilled? (please list name of each medication and dose if known) atorvastatin (LIPITOR) 20 MG tablet  2. Which pharmacy/location (including street and city if local pharmacy) is medication to be sent to? [facility] - [address] [place], [place], [place] [address] [phone])  3. Do they need a 30 day or 90 day supply? 90 day supply  Patient scheduled an appointment for 12/28/19 with Dr. Herbie Baltimore.

## 2020-10-25 ENCOUNTER — Telehealth: Payer: Self-pay | Admitting: Cardiology

## 2020-10-25 MED ORDER — ATORVASTATIN CALCIUM 20 MG PO TABS: 20.0000 mg | ORAL_TABLET | Freq: Every day | ORAL | 0 refills | Status: DC

## 2020-10-25 NOTE — Telephone Encounter (Signed)
Pt c/o medication issue:  1. Name of Medication: atorvastatin (LIPITOR) 20 MG tablet  2. How are you currently taking this medication (dosage and times per day)? As written  3. Are you having a reaction (difficulty breathing--STAT)? No  4. What is your medication issue?  Pt needs a new prescription sent in. Office visit has been set.

## 2020-10-25 NOTE — Telephone Encounter (Signed)
Sent rx to requested pharmacy.

## 2020-11-10 DIAGNOSIS — Z1211 Encounter for screening for malignant neoplasm of colon: Secondary | ICD-10-CM | POA: Diagnosis not present

## 2020-11-10 DIAGNOSIS — I4891 Unspecified atrial fibrillation: Secondary | ICD-10-CM | POA: Diagnosis not present

## 2020-11-10 DIAGNOSIS — I10 Essential (primary) hypertension: Secondary | ICD-10-CM | POA: Diagnosis not present

## 2020-11-10 DIAGNOSIS — E785 Hyperlipidemia, unspecified: Secondary | ICD-10-CM | POA: Diagnosis not present

## 2020-11-10 DIAGNOSIS — Z23 Encounter for immunization: Secondary | ICD-10-CM | POA: Diagnosis not present

## 2020-11-10 DIAGNOSIS — Z0000 Encounter for general adult medical examination without abnormal findings: Secondary | ICD-10-CM | POA: Diagnosis not present

## 2020-12-17 ENCOUNTER — Other Ambulatory Visit: Payer: Self-pay | Admitting: Cardiology

## 2020-12-22 ENCOUNTER — Other Ambulatory Visit: Payer: Self-pay | Admitting: Cardiology

## 2020-12-27 ENCOUNTER — Ambulatory Visit: Payer: PPO | Admitting: Cardiology

## 2020-12-27 ENCOUNTER — Other Ambulatory Visit: Payer: Self-pay

## 2020-12-27 VITALS — BP 117/61 | HR 65 | Ht 69.0 in | Wt 211.2 lb

## 2020-12-27 DIAGNOSIS — E669 Obesity, unspecified: Secondary | ICD-10-CM | POA: Diagnosis not present

## 2020-12-27 DIAGNOSIS — I480 Paroxysmal atrial fibrillation: Secondary | ICD-10-CM

## 2020-12-27 DIAGNOSIS — I2510 Atherosclerotic heart disease of native coronary artery without angina pectoris: Secondary | ICD-10-CM

## 2020-12-27 DIAGNOSIS — Z7901 Long term (current) use of anticoagulants: Secondary | ICD-10-CM | POA: Diagnosis not present

## 2020-12-27 DIAGNOSIS — Z8679 Personal history of other diseases of the circulatory system: Secondary | ICD-10-CM

## 2020-12-27 DIAGNOSIS — I872 Venous insufficiency (chronic) (peripheral): Secondary | ICD-10-CM

## 2020-12-27 DIAGNOSIS — Z951 Presence of aortocoronary bypass graft: Secondary | ICD-10-CM | POA: Diagnosis not present

## 2020-12-27 DIAGNOSIS — E785 Hyperlipidemia, unspecified: Secondary | ICD-10-CM | POA: Diagnosis not present

## 2020-12-27 MED ORDER — POTASSIUM CHLORIDE CRYS ER 10 MEQ PO TBCR: 10.0000 meq | EXTENDED_RELEASE_TABLET | Freq: Every day | ORAL | 3 refills | Status: DC

## 2020-12-27 NOTE — Progress Notes (Signed)
Primary Care Provider: Farris Has, MD Cardiologist: Bryan Lemma, MD Electrophysiologist: None  Clinic Note: Chief Complaint  Patient presents with   Follow-up    Annual   Coronary Artery Disease    (S/p CABG) no angina   Atrial Fibrillation    No recurrent symptoms to suggest A. fib.   Problem List Items Addressed This Visit    PAF (paroxysmal atrial fibrillation) (HCC): CHA2DS2Vasc = 3 (age, HTN, CAD). on Eliquis - Primary (Chronic)   History of unstable angina (Chronic)   Obesity (BMI 30-39.9) (Chronic)   Coronary artery disease involving native coronary artery of native heart without angina pectoris (Chronic)   S/P CABG x 4 (Chronic)   Chronic anticoagulation, for PAF with eliquis (Chronic)   Venous insufficiency of left leg (Chronic)   Hyperlipidemia LDL goal <70 (Chronic)      HPI:    Alfred Thompson is a 76 y.o. male with a PMH notable for CAD-MI (multivessel CAD-CABG) in the setting of A. fib/flutter RVR who presents today for annual follow-up.  Alfred Thompson was last seen on December 10, 2019-doing quite well. No other symptoms to suggest recurrence of A. fib. No exertional dyspnea or chest pain. Very active, frustrated about the gym restrictions with COVID-19. Was really staying socially distant. 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 His brother had recently died shortly after his sister-in-law was acutely ill.  Recent Hospitalizations: None  Reviewed  CV studies:    The following studies were reviewed today: (if available, images/films reviewed: From Epic Chart or Care Everywhere)  None:  Interval History:   Alfred Thompson returns today for annual follow-up overall doing very well. Other than little bit of congestion with allergies, and indigestion symptoms, he is feeling fine. He still has a little bit of swelling, relatively well controlled with his support stockings. He has not required any diuretic. He denies any signs or symptoms to suggest  recurrence of A. fib. No tachycardia, or irregular heartbeats. No signs or symptoms of chronotropic incompetence on current dose of beta-blocker. No bleeding on Eliquis. No further angina.  CV Review of Symptoms (Summary): no chest pain or dyspnea on exertion positive for - Stable edema negative for - irregular heartbeat, orthopnea, palpitations, paroxysmal nocturnal dyspnea, rapid heart rate, shortness of breath or Lightheadedness, dizziness, wooziness or syncope/near syncope, TIA/amaurosis fugax.  The patient does not have symptoms concerning for COVID-19 infection (fever, chills, cough, or new shortness of breath).   REVIEWED OF SYSTEMS   Review of Systems  Constitutional: Negative for malaise/fatigue and weight loss.  HENT: Negative for congestion and sinus pain.   Respiratory: Negative for cough and shortness of breath.   Cardiovascular: Positive for leg swelling (Stable if not improved.).  Gastrointestinal: Positive for heartburn (/Indigestion-occasional, depends on what he eats.). Negative for blood in stool, constipation and melena.  Genitourinary: Negative for hematuria.  Musculoskeletal: Positive for joint pain (Mild aches and pains, "-normal for 75 year old").  Neurological: Positive for headaches (Off-and-on). Negative for dizziness, focal weakness and weakness.  Psychiatric/Behavioral: Negative for memory loss. The patient is not nervous/anxious and does not have insomnia.    I have reviewed and (if needed) personally updated the patient's problem list, medications, allergies, past medical and surgical history, social and family history.   PAST MEDICAL HISTORY   Past Medical History:  Diagnosis Date   Back pain, chronic    Resolved   First degree AV block 12/09/2018   Noted on EKG   Hyperlipidemia  on statin   Left main coronary artery disease 10/09/2013   Distal Left Main 80-90%; ostial/proximal LAD 50-80%, proximal RI 80%, proximal RCA napkin ring 80-90%;  relatively normal Circumflex/Lateral OM --> referred for CABG; 2-D echo March 2015: EF 55-60% with no regional wall motion on his   Obesity (BMI 30-39.9)    Paroxysmal A-fib ([place]) 10/09/2013   Admitted with Afib RVR, 2 episodes post-op. -Has some strips that looks like atrial flutter   Pleural effusion, left 09/2013   S/P CABG x 4 10/10/2013   (LIMA to LAD, SVG to OM, SVG to RAMUS INTERMEDIATE, and SVG to RCA   Unstable angina ([place]) 10/09/2013   Venous insufficiency    Left leg    PAST SURGICAL HISTORY   Past Surgical History:  Procedure Laterality Date   CARDIOVERSION N/A 11/14/2018   Procedure: CARDIOVERSION;  Surgeon: Sueanne Margarita, MD;  Location: [facility];  Service: Cardiovascular;  Laterality: N/A;   CORONARY ARTERY BYPASS GRAFT N/A 10/10/2013   Procedure: CORONARY ARTERY BYPASS GRAFTING (CABG);  Surgeon: Gaye Pollack, MD;  Location: [facility];  Service: Open Heart Surgery;  Laterality: N/A;  Times 4 using left internal mammary artery and endoscopically harvested left saphenous vein   DUPUYTREN / PALMAR FASCIOTOMY Bilateral    HERNIA REPAIR     INTRAOPERATIVE TRANSESOPHAGEAL ECHOCARDIOGRAM N/A 10/10/2013   Procedure: INTRAOPERATIVE TRANSESOPHAGEAL ECHOCARDIOGRAM;  Surgeon: Gaye Pollack, MD;  Location: [facility];  Service: Open Heart Surgery;  Laterality: N/A;   LEFT HEART CATHETERIZATION WITH CORONARY ANGIOGRAM N/A 10/09/2013   Procedure: LEFT HEART CATHETERIZATION WITH CORONARY ANGIOGRAM;  Surgeon: Leonie Man, MD;  Location: [facility] C[facility];  Distal Left Main 80-90%; ostial/proximal LAD 50-80%, proximal RI 80%, proximal RCA napkin ring 80-90%; relatively normal Circumflex/Lateral OM --> referred for CABG   NM MYOVIEW LTD  01/2017   EF 55-65%.  Subtle mild perfusion defect and apical lateral wall.  No reversibility.  Cannot exclude diaphragmatic attenuation.  LOW RISK.   PROSTATE BIOPSY N/A 01/02/2019   Procedure: BIOPSY TRANSRECTAL ULTRASONIC PROSTATE (TUBP);   Surgeon: Kathie Rhodes, MD;  Location: [facility];  Service: Urology;  Laterality: N/A;     There is no immunization history on file for this patient.  MEDICATIONS/ALLERGIES   Current Meds  Medication Sig   atorvastatin (LIPITOR) 20 MG tablet Take 1 tablet (20 mg total) by mouth daily. NEED OV.   ELIQUIS 5 MG TABS tablet TAKE 1 TABLET BY MOUTH TWICE A DAY   [DISCONTINUED] metoprolol succinate (TOPROL-XL) 25 MG 24 hr tablet Take 1 tablet (25 mg total) by mouth daily.   [DISCONTINUED] potassium chloride (KLOR-CON) 10 MEQ tablet Take 1 tablet by mouth once daily    No Known Allergies  SOCIAL HISTORY/FAMILY HISTORY   Reviewed in Epic:  Pertinent findings:  Social History   Tobacco Use   Smoking status: Never Smoker   Smokeless tobacco: Never Used  Vaping Use   Vaping Use: Never used  Substance Use Topics   Alcohol use: Yes    Alcohol/week: 0.0 standard drinks    Comment: occasional   Drug use: No   Social History   Social History Narrative   Married, one son, no grandchildren.    He drives for [employer]      -> He frequently mentions that he was a former middle distance runner up until maybe a year or so before his MI.      December 2020: Continues to stay active walking in the  neighborhood, but not doing regular exercise.  His usual exercise routine is curtailed by COVID-19, but he says he walks about a half a mile to a mile a day.  When he comes to he will, he actually tries to increase his speed going up it to try to challenge himself.    OBJCTIVE -PE, EKG, labs   Wt Readings from Last 3 Encounters:  12/27/20 211 lb 3.2 oz (95.8 kg)  12/10/19 210 lb (95.3 kg)  01/06/19 209 lb (94.8 kg)    Physical Exam: BP 117/61    Pulse 65    Ht 5\' 9"  (1.753 m)    Wt 211 lb 3.2 oz (95.8 kg)    SpO2 98%    BMI 31.19 kg/m  Physical Exam Vitals reviewed.  Constitutional:      General: He is not in acute distress.    Appearance: Normal appearance. He is obese.  He is not ill-appearing (Healthy appearing. Well-groomed) or toxic-appearing.  HENT:     Head: Normocephalic and atraumatic.  Neck:     Vascular: No carotid bruit, hepatojugular reflux or JVD.  Cardiovascular:     Rate and Rhythm: Normal rate. Rhythm irregular. Occasional extrasystoles are present.    Chest Wall: PMI is not displaced.     Pulses: Intact distal pulses.     Heart sounds: Normal heart sounds. No murmur heard. No friction rub. No gallop.   Pulmonary:     Effort: Pulmonary effort is normal. No respiratory distress.     Breath sounds: Normal breath sounds.  Chest:     Chest wall: No tenderness.  Musculoskeletal:        General: No swelling. Normal range of motion.     Cervical back: Normal range of motion and neck supple.  Skin:    General: Skin is warm and dry.  Neurological:     General: No focal deficit present.     Mental Status: He is alert and oriented to person, place, and time. Mental status is at baseline.     Gait: Gait normal.  Psychiatric:        Mood and Affect: Mood normal.        Behavior: Behavior normal.        Thought Content: Thought content normal.        Judgment: Judgment normal.     Adult ECG Report  Rate: 65 ;  Rhythm: normal sinus rhythm, sinus arrhythmia, premature atrial contractions (PAC) and Nonspecific ST and T wave changes. Otherwise normal axis, intervals and durations.;   Narrative Interpretation: Stable EKG.  Recent Labs:    Apr 27, 2020: TC 94, TG 125, HDL 32 LDL 40. Hgb 15.6, Cr 0.94, K+ 4.1. Normal LFTs. Lab Results  Component Value Date   CHOL 94 (L) 11/10/2018   HDL 38 (L) 11/10/2018   LDLCALC 42 11/10/2018   TRIG 69 11/10/2018   CHOLHDL 2.5 11/10/2018   Lab Results  Component Value Date   CREATININE 1.14 12/31/2018   BUN 14 12/31/2018   NA 144 12/31/2018   K 4.5 12/31/2018   CL 108 12/31/2018   CO2 30 12/31/2018   CBC Latest Ref Rng & Units 12/31/2018 11/10/2018 01/20/2014  WBC 4.0 - 10.5 K/uL 10.7(H) 9.0 10.5   Hemoglobin 13.0 - 17.0 g/dL 01/22/2014 02.4 09.7  Hematocrit 39.0 - 52.0 % 47.6 45.3 48.0  Platelets 150 - 400 K/uL 160 190 220    Lab Results  Component Value Date   TSH 3.401 01/20/2014  ASSESSMENT/PLAN    Problem List Items Addressed This Visit    PAF (paroxysmal atrial fibrillation) (HCC): CHA2DS2Vasc = 3 (age, HTN, CAD). on Eliquis (Chronic)    Last documented episode was in November 2019. Unfortunately, he really doesn't have any true sensations, just has fatigue and some dyspnea.  No longer on amiodarone. Remaining on the maintenance dose Toprol 25 mg adequate rate control. On Eliquis with no bleeding issues.      Relevant Orders   EKG 12-Lead (Completed)   History of unstable angina (Chronic)    His initial presentation was unstable angina in the setting of A. fib RVR, found to have multivessel disease. He has not had any further angina symptoms since CABG, even during a recurrent episode of A. Fib.  Remains on beta-blocker.      Relevant Orders   EKG 12-Lead (Completed)   Obesity (BMI 30-39.9) (Chronic)    Chest portable obese. He is trying to lose weight. Watching his diet as best he can, and exercising, just has a hard time losing weight      Relevant Orders   EKG 12-Lead (Completed)   Coronary artery disease involving native coronary artery of native heart without angina pectoris - Primary (Chronic)    He had multivessel disease including left main disease that was not symptomatic until he went into A. fib RVR. He is very active with walking and exercise, he is not having any angina symptoms.  He had a Myoview in 2018 that was nonischemic with some diaphragmatic attenuation. Previous  Plan:  Continue low-dose metoprolol and statin.  Not on aspirin because of Eliquis.  We talked about the possibility of doing a stress test this year which would be 4 years out from his most recent test. He would like to think about it, we can discuss at next visit in a year. He  seems to be inclined to hold off on doing further testing unless he has symptoms.      Relevant Orders   EKG 12-Lead (Completed)   S/P CABG x 4 (Chronic)   Relevant Orders   EKG 12-Lead (Completed)   Chronic anticoagulation, for PAF with eliquis (Chronic)   Relevant Orders   EKG 12-Lead (Completed)   Venous insufficiency of left leg (Chronic)    Stable -edema much improved -; PRN support stockings      Relevant Orders   EKG 12-Lead (Completed)   Hyperlipidemia LDL goal <70 (Chronic)    Excellent lipid control. He is on moderate dose of atorvastatin, tolerating well.      Relevant Orders   EKG 12-Lead (Completed)       COVID-19 Education: The signs and symptoms of COVID-19 were discussed with the patient and how to seek care for testing (follow up with PCP or arrange E-visit).   The importance of social distancing and COVID-19 vaccination was discussed today. The patient is practicing social distancing & Masking.   I spent a total of with the patient spent in direct patient consultation.  Additional time spent with chart review  / charting (studies, outside notes, etc): 20 min Total Time: 24 min   Current medicines are reviewed at length with the patient today.  (+/- concerns) n/a  This visit occurred during the SARS-CoV-2 public health emergency.  Safety protocols were in place, including screening questions prior to the visit, additional usage of staff PPE, and extensive cleaning of exam room while observing appropriate contact time as indicated for disinfecting solutions.  Notice: This dictation  was prepared with Dragon dictation along with smaller phrase technology. Any transcriptional errors that result from this process are unintentional and may not be corrected upon review.  Patient Instructions / Medication Changes & Studies & Tests Ordered   Patient Instructions  Medication Instructions:  Your physician recommends that you continue on your current  medications as directed. Please refer to the Current Medication list given to you today.  *If you need a refill on your cardiac medications before your next appointment, please call your pharmacy*   Follow-Up: At [facility], you and your health needs are our priority.  As part of our continuing mission to provide you with exceptional heart care, we have created designated Provider Care Teams.  These Care Teams include your primary Cardiologist (physician) and Advanced Practice Providers (APPs -  Physician Assistants and Nurse Practitioners) who all work together to provide you with the care you need, when you need it.  We recommend signing up for the patient portal called "MyChart".  Sign up information is provided on this After Visit Summary.  MyChart is used to connect with patients for Virtual Visits (Telemedicine).  Patients are able to view lab/test results, encounter notes, upcoming appointments, etc.  Non-urgent messages can be sent to your provider as well.   To learn more about what you can do with MyChart, go to [email or web address].    Your next appointment:   12 month(s)  The format for your next appointment:   In Person  Provider:   You may see Bryan Lemma, MD or one of the following Advanced Practice Providers on your designated Care Team:    Theodore Demark, PA-C  Joni Reining, DNP, ANP    Other Instructions    Studies Ordered:   Orders Placed This Encounter  Procedures   EKG 12-Lead     Bryan Lemma, M.D., M.S. Interventional Cardiologist   Pager # [phone] Phone # [phone] [address] [place], [place] [address]   Thank you for choosing Heartcare at [facility]!!

## 2020-12-27 NOTE — Assessment & Plan Note (Signed)
Stable -edema much improved -; PRN support stockings

## 2020-12-27 NOTE — Patient Instructions (Signed)
Medication Instructions:  Your physician recommends that you continue on your current medications as directed. Please refer to the Current Medication list given to you today.  *If you need a refill on your cardiac medications before your next appointment, please call your pharmacy*   Follow-Up: At [facility], you and your health needs are our priority.  As part of our continuing mission to provide you with exceptional heart care, we have created designated Provider Care Teams.  These Care Teams include your primary Cardiologist (physician) and Advanced Practice Providers (APPs -  Physician Assistants and Nurse Practitioners) who all work together to provide you with the care you need, when you need it.  We recommend signing up for the patient portal called "MyChart".  Sign up information is provided on this After Visit Summary.  MyChart is used to connect with patients for Virtual Visits (Telemedicine).  Patients are able to view lab/test results, encounter notes, upcoming appointments, etc.  Non-urgent messages can be sent to your provider as well.   To learn more about what you can do with MyChart, go to [email or web address].    Your next appointment:   12 month(s)  The format for your next appointment:   In Person  Provider:   You may see Bryan Lemma, MD or one of the following Advanced Practice Providers on your designated Care Team:    Theodore Demark, PA-C  Joni Reining, DNP, ANP    Other Instructions

## 2020-12-28 ENCOUNTER — Other Ambulatory Visit: Payer: Self-pay | Admitting: Cardiology

## 2021-01-02 ENCOUNTER — Encounter: Payer: Self-pay | Admitting: Cardiology

## 2021-01-02 NOTE — Assessment & Plan Note (Signed)
His initial presentation was unstable angina in the setting of A. fib RVR, found to have multivessel disease. He has not had any further angina symptoms since CABG, even during a recurrent episode of A. Fib.  Remains on beta-blocker.

## 2021-01-02 NOTE — Assessment & Plan Note (Signed)
Last documented episode was in November 2019. Unfortunately, he really doesn't have any true sensations, just has fatigue and some dyspnea.  No longer on amiodarone. Remaining on the maintenance dose Toprol 25 mg adequate rate control. On Eliquis with no bleeding issues.

## 2021-01-02 NOTE — Assessment & Plan Note (Signed)
He had multivessel disease including left main disease that was not symptomatic until he went into A. fib RVR. He is very active with walking and exercise, he is not having any angina symptoms.  He had a Myoview in 2018 that was nonischemic with some diaphragmatic attenuation. Previous  Plan:  Continue low-dose metoprolol and statin.  Not on aspirin because of Eliquis.  We talked about the possibility of doing a stress test this year which would be 4 years out from his most recent test. He would like to think about it, we can discuss at next visit in a year. He seems to be inclined to hold off on doing further testing unless he has symptoms.

## 2021-01-02 NOTE — Assessment & Plan Note (Signed)
Excellent lipid control. He is on moderate dose of atorvastatin, tolerating well.

## 2021-01-02 NOTE — Assessment & Plan Note (Signed)
Chest portable obese. He is trying to lose weight. Watching his diet as best he can, and exercising, just has a hard time losing weight

## 2021-04-19 ENCOUNTER — Other Ambulatory Visit: Payer: Self-pay | Admitting: Cardiology

## 2021-05-01 ENCOUNTER — Other Ambulatory Visit: Payer: Self-pay | Admitting: Cardiology

## 2021-05-02 DIAGNOSIS — E785 Hyperlipidemia, unspecified: Secondary | ICD-10-CM | POA: Diagnosis not present

## 2021-05-02 DIAGNOSIS — I10 Essential (primary) hypertension: Secondary | ICD-10-CM | POA: Diagnosis not present

## 2021-05-02 DIAGNOSIS — I4891 Unspecified atrial fibrillation: Secondary | ICD-10-CM | POA: Diagnosis not present

## 2021-05-17 ENCOUNTER — Other Ambulatory Visit: Payer: Self-pay | Admitting: Cardiology

## 2021-07-03 ENCOUNTER — Other Ambulatory Visit: Payer: Self-pay | Admitting: Cardiology

## 2021-07-03 NOTE — Telephone Encounter (Signed)
7m, 95.8kg, Creatinine, Serum 0.860 mg/ 05/02/2021, lovw/ harding 12/27/20

## 2022-01-04 DIAGNOSIS — E785 Hyperlipidemia, unspecified: Secondary | ICD-10-CM | POA: Diagnosis not present

## 2022-01-04 DIAGNOSIS — Z0000 Encounter for general adult medical examination without abnormal findings: Secondary | ICD-10-CM | POA: Diagnosis not present

## 2022-01-04 DIAGNOSIS — I10 Essential (primary) hypertension: Secondary | ICD-10-CM | POA: Diagnosis not present

## 2022-01-04 DIAGNOSIS — M25519 Pain in unspecified shoulder: Secondary | ICD-10-CM | POA: Diagnosis not present

## 2022-01-04 DIAGNOSIS — I4891 Unspecified atrial fibrillation: Secondary | ICD-10-CM | POA: Diagnosis not present

## 2022-01-04 DIAGNOSIS — Z1211 Encounter for screening for malignant neoplasm of colon: Secondary | ICD-10-CM | POA: Diagnosis not present

## 2022-01-05 ENCOUNTER — Telehealth: Payer: Self-pay | Admitting: Cardiology

## 2022-01-05 DIAGNOSIS — I483 Typical atrial flutter: Secondary | ICD-10-CM

## 2022-01-05 MED ORDER — AMIODARONE HCL 200 MG PO TABS: ORAL_TABLET | ORAL | 0 refills | Status: DC

## 2022-01-05 NOTE — Telephone Encounter (Signed)
Toni Amend is calling requesting Dr. Herbie Baltimore call Dr. Kateri Plummer to discuss the patient's EKG results from yesterday. Callback number listed is Dr. Vincente Liberty personal cell.

## 2022-01-05 NOTE — Telephone Encounter (Signed)
° ° °  Received a telephone call from the patient's PCP (Dr. Farris Has) to discuss results of EKG. 01/05/2022 1:47 PM  Pt. was being seen @PCP  office for routine f/u.  Noted some palpitations.  -->EKG done -- A Flutter with PVCs (aberrantly conduced beats).  Inc RBBB.  Patient indicates he been doing fine just felt a few palpitations here and there nothing overly worrisome.  Normal blood pressures on basic exam.  No chest pain, pressure or dyspnea.  No syncope or near syncope. Otherwise doing well.  Discussed with Dr. --> patient has had a history of atrial fibrillation in the past, in fact his initial presentation was atrial fibrillation with rapid rate leading to his MI. In the past, we were able to successfully cardiovert him with amiodarone.  Plan: We will attempt chemical cardioversion with amiodarone since he is already on Eliquis.  Amiodarone 400 mg twice daily x1 week and 200 mg twice daily x1 week.   Will try to arrange follow-up in A. fib clinic, or notify clinic to reassess EKG in 1 to 2 weeks.  If he remains in atrial flutter at that time, would schedule for cardioversion.  Would continue amiodarone 200 mg daily and until after cardioversion for 1 week.   Patient Active Problem List   Diagnosis Date Noted   PAF (paroxysmal atrial fibrillation) (HCC): CHA2DS2Vasc = 3 (age, HTN, CAD). on Eliquis 01/20/2014    Priority: High   History of unstable angina 10/09/2013    Priority: Medium    Obesity (BMI 30-39.9) 10/09/2013    Priority: Medium     Class: Diagnosis of   Typical atrial flutter (HCC) 11/07/2018   Preop cardiovascular exam 11/07/2018   Hyperlipidemia LDL goal <70 09/24/2014   Edema of left lower extremity 03/03/2014   Bradycardia with 41-50 beats per minute 03/03/2014   Venous insufficiency of left leg 03/03/2014   Leg edema, left 01/20/2014   Cellulitis, Lt leg 01/20/2014   Chronic anticoagulation, for PAF with eliquis 01/20/2014   S/P CABG x 4  11/04/2013   Coronary artery disease involving native coronary artery of native heart without angina pectoris 10/11/2013   BACK PAIN, THORACIC REGION, RIGHT 01/25/2009   UNS ADVRS EFF UNS RX MEDICINAL&BIOLOGICAL SBSTNC 01/25/2009      03/25/2009, MD

## 2022-01-05 NOTE — Telephone Encounter (Signed)
Forwarded to Dr. Harding 

## 2022-01-05 NOTE — Telephone Encounter (Signed)
Addressed in telephone note from today.  Ordered amiodarone.  Bryan Lemma, MD

## 2022-01-05 NOTE — Telephone Encounter (Signed)
Call transferred to Sharon

## 2022-01-08 ENCOUNTER — Other Ambulatory Visit: Payer: Self-pay | Admitting: Cardiology

## 2022-01-08 NOTE — Telephone Encounter (Signed)
Prescription refill request for Eliquis received. Indication:Afib Last office visit:upcoming Scr:0.9 Age: 76 Weight:95.8 kg  Prescription refilled

## 2022-01-16 ENCOUNTER — Other Ambulatory Visit: Payer: Self-pay | Admitting: Cardiology

## 2022-01-17 ENCOUNTER — Encounter (HOSPITAL_COMMUNITY): Payer: Self-pay | Admitting: Physician Assistant

## 2022-01-17 ENCOUNTER — Ambulatory Visit (HOSPITAL_COMMUNITY)
Admission: RE | Admit: 2022-01-17 | Discharge: 2022-01-17 | Disposition: A | Discharge status: Home / Self Care | Payer: PPO | Source: Ambulatory Visit | Attending: Physician Assistant | Admitting: Physician Assistant

## 2022-01-17 ENCOUNTER — Other Ambulatory Visit: Payer: Self-pay

## 2022-01-17 VITALS — BP 112/72 | HR 69 | Ht 69.0 in | Wt 203.6 lb

## 2022-01-17 DIAGNOSIS — I10 Essential (primary) hypertension: Secondary | ICD-10-CM | POA: Diagnosis not present

## 2022-01-17 DIAGNOSIS — D6869 Other thrombophilia: Secondary | ICD-10-CM | POA: Diagnosis not present

## 2022-01-17 DIAGNOSIS — E785 Hyperlipidemia, unspecified: Secondary | ICD-10-CM | POA: Insufficient documentation

## 2022-01-17 DIAGNOSIS — E669 Obesity, unspecified: Secondary | ICD-10-CM | POA: Diagnosis not present

## 2022-01-17 DIAGNOSIS — I4819 Other persistent atrial fibrillation: Secondary | ICD-10-CM | POA: Diagnosis not present

## 2022-01-17 DIAGNOSIS — Z79899 Other long term (current) drug therapy: Secondary | ICD-10-CM | POA: Diagnosis not present

## 2022-01-17 DIAGNOSIS — Z951 Presence of aortocoronary bypass graft: Secondary | ICD-10-CM | POA: Diagnosis not present

## 2022-01-17 DIAGNOSIS — I2510 Atherosclerotic heart disease of native coronary artery without angina pectoris: Secondary | ICD-10-CM | POA: Diagnosis not present

## 2022-01-17 DIAGNOSIS — I480 Paroxysmal atrial fibrillation: Secondary | ICD-10-CM | POA: Insufficient documentation

## 2022-01-17 DIAGNOSIS — Z7901 Long term (current) use of anticoagulants: Secondary | ICD-10-CM | POA: Diagnosis not present

## 2022-01-17 DIAGNOSIS — Z6830 Body mass index (BMI) 30.0-30.9, adult: Secondary | ICD-10-CM | POA: Diagnosis not present

## 2022-01-17 LAB — CBC
HCT: 46.1 % (ref 39.0–52.0)
Hemoglobin: 15.5 g/dL (ref 13.0–17.0)
MCH: 33.5 pg (ref 26.0–34.0)
MCHC: 33.6 g/dL (ref 30.0–36.0)
MCV: 99.6 fL (ref 80.0–100.0)
Platelets: 202 10*3/uL (ref 150–400)
RBC: 4.63 MIL/uL (ref 4.22–5.81)
RDW: 13.7 % (ref 11.5–15.5)
WBC: 9.4 10*3/uL (ref 4.0–10.5)
nRBC: 0 % (ref 0.0–0.2)

## 2022-01-17 LAB — BASIC METABOLIC PANEL
Anion gap: 3 — ABNORMAL LOW (ref 5–15)
BUN: 11 mg/dL (ref 8–23)
CO2: 25 mmol/L (ref 22–32)
Calcium: 8.7 mg/dL — ABNORMAL LOW (ref 8.9–10.3)
Chloride: 108 mmol/L (ref 98–111)
Creatinine, Ser: 1.15 mg/dL (ref 0.61–1.24)
GFR, Estimated: >60 mL/min (ref >60)
Glucose, Bld: 94 mg/dL (ref 70–99)
Potassium: 3.9 mmol/L (ref 3.5–5.1)
Sodium: 136 mmol/L (ref 135–145)

## 2022-01-17 MED ORDER — AMIODARONE HCL 200 MG PO TABS: 200.0000 mg | ORAL_TABLET | Freq: Every day | ORAL | 1 refills | Status: DC

## 2022-01-17 NOTE — Progress Notes (Signed)
Primary Care Physician: Farris Has, MD Primary Cardiologist: Dr Herbie Baltimore Primary Electrophysiologist: none Referring Physician: Dr Vincenza Hews is a 76 y.o. male with a history of CAD s/p CABG 2014, HLD, HTN, atrial fibrillation who presents for consultation in the [facility]. Patient is on Eliquis for a CHADS2VASC score of 3. He was diagnosed with afib in 2014 at the time of his CABG. He was initially on amiodarone but has not been maintained on this long term. He was seen by his PCP 01/05/22 and was found to be in atrial flutter. He was restarted on amiodarone in an attempt to chemically cardiovert. Today, patient reports that he feels improved since starting amiodarone. When out of rhythm he states he "just doesn't feel right in his chest." There were no specific triggers that he could identify.   Today, he denies symptoms of palpitations, chest pain, shortness of breath, orthopnea, PND, lower extremity edema, dizziness, presyncope, syncope, snoring, daytime somnolence, bleeding, or neurologic sequela. The patient is tolerating medications without difficulties and is otherwise without complaint today.    Atrial Fibrillation Risk Factors:  he does not have symptoms or diagnosis of sleep apnea. he does not have a history of rheumatic fever. he does have a history of alcohol use.   he has a BMI of Body mass index is 30.07 kg/m.Marland Kitchen Filed Weights   01/17/22 1353  Weight: 92.4 kg    Family History  Problem Relation Age of Onset   Coronary artery disease Father 70       CABG   Rectal cancer Mother    Transient ischemic attack Mother      Atrial Fibrillation Management history:  Previous antiarrhythmic drugs: amiodarone  Previous cardioversions: 11/14/18 Previous ablations: none CHADS2VASC score: 3 Anticoagulation history: Eliquis   Past Medical History:  Diagnosis Date   Back pain, chronic    Resolved   First degree AV block  12/09/2018   Noted on EKG   Hyperlipidemia    on statin   Left main coronary artery disease 10/09/2013   Distal Left Main 80-90%; ostial/proximal LAD 50-80%, proximal RI 80%, proximal RCA napkin ring 80-90%; relatively normal Circumflex/Lateral OM --> referred for CABG; 2-D echo March 2015: EF 55-60% with no regional wall motion on his   Obesity (BMI 30-39.9)    Paroxysmal A-fib (HCC) 10/09/2013   Admitted with Afib RVR, 2 episodes post-op. -Has some strips that looks like atrial flutter   Pleural effusion, left 09/2013   S/P CABG x 4 10/10/2013   (LIMA to LAD, SVG to OM, SVG to RAMUS INTERMEDIATE, and SVG to RCA   Unstable angina (HCC) 10/09/2013   Venous insufficiency    Left leg   Past Surgical History:  Procedure Laterality Date   CARDIOVERSION N/A 11/14/2018   Procedure: CARDIOVERSION;  Surgeon: Quintella Reichert, MD;  [facility];  Service: Cardiovascular;  Laterality: N/A;   CORONARY ARTERY BYPASS GRAFT N/A 10/10/2013   Procedure: CORONARY ARTERY BYPASS GRAFTING (CABG);  Surgeon: Alleen Borne, MD;  Location: [facility] OR;  Service: Open Heart Surgery;  Laterality: N/A;  Times 4 using left internal mammary artery and endoscopically harvested left saphenous vein   DUPUYTREN / PALMAR FASCIOTOMY Bilateral    HERNIA REPAIR     INTRAOPERATIVE TRANSESOPHAGEAL ECHOCARDIOGRAM N/A 10/10/2013   Procedure: INTRAOPERATIVE TRANSESOPHAGEAL ECHOCARDIOGRAM;  Surgeon: Alleen Borne, MD;  Location: [facility] OR;  Service: Open Heart Surgery;  Laterality: N/A;   LEFT HEART CATHETERIZATION  WITH CORONARY ANGIOGRAM N/A 10/09/2013   Procedure: LEFT HEART CATHETERIZATION WITH CORONARY ANGIOGRAM;  Surgeon: Leonie Man, MD;  Location: [facility] CATH LAB;  Distal Left Main 80-90%; ostial/proximal LAD 50-80%, proximal RI 80%, proximal RCA napkin ring 80-90%; relatively normal Circumflex/Lateral OM --> referred for CABG   NM MYOVIEW LTD  01/2017   EF 55-65%.  Subtle mild perfusion defect and apical lateral wall.   No reversibility.  Cannot exclude diaphragmatic attenuation.  LOW RISK.   PROSTATE BIOPSY N/A 01/02/2019   Procedure: BIOPSY TRANSRECTAL ULTRASONIC PROSTATE (TUBP);  Surgeon: Kathie Rhodes, MD;  Location: [facility] ORS;  Service: Urology;  Laterality: N/A;    Current Outpatient Medications  Medication Sig Dispense Refill   amiodarone (PACERONE) 200 MG tablet Take 2 tablets (400 mg total) by mouth 2 (two) times daily for 7 days, THEN 1 tablet (200 mg total) 2 (two) times daily for 7 days, THEN 1 tablet (200 mg total) daily for 14 days. 56 tablet 0   atorvastatin (LIPITOR) 20 MG tablet TAKE 1 TABLET BY MOUTH EVERY DAY 90 tablet 2   ELIQUIS 5 MG TABS tablet TAKE 1 TABLET BY MOUTH TWICE A DAY 180 tablet 1   metoprolol succinate (TOPROL-XL) 25 MG 24 hr tablet TAKE 1 TABLET BY MOUTH EVERY DAY 90 tablet 3   potassium chloride (KLOR-CON) 10 MEQ tablet Take 1 tablet (10 mEq total) by mouth daily. 90 tablet 3   No current facility-administered medications for this encounter.    No Known Allergies  Social History   Socioeconomic History   Marital status: Married    Spouse name: Not on file   Number of children: Not on file   Years of education: Not on file   Highest education level: Not on file  Occupational History   Not on file  Tobacco Use   Smoking status: Never   Smokeless tobacco: Never  Vaping Use   Vaping Use: Never used  Substance and Sexual Activity   Alcohol use: Not Currently    Alcohol/week: 1.0 standard drink    Types: 1 Cans of beer per week    Comment: once a week 1 beer 01/17/22   Drug use: No   Sexual activity: Not on file  Other Topics Concern   Not on file  Social History Narrative   Married, one son, no grandchildren.    He drives for [employer]      -> He frequently mentions that he was a former middle distance runner up until maybe a year or so before his MI.      December 2020: Continues to stay active walking in the neighborhood, but not doing regular  exercise.  His usual exercise routine is curtailed by COVID-19, but he says he walks about a half a mile to a mile a day.  When he comes to he will, he actually tries to increase his speed going up it to try to challenge himself.   Social Determinants of Health   Financial Resource Strain: Not on file  Food Insecurity: Not on file  Transportation Needs: Not on file  Physical Activity: Not on file  Stress: Not on file  Social Connections: Not on file  Intimate Partner Violence: Not on file     ROS- All systems are reviewed and negative except as per the HPI above.  Physical Exam: Vitals:   01/17/22 1353  BP: 112/72  Pulse: 69  Weight: 92.4 kg  Height: 5\' 9"  (1.753 m)  GEN- The patient is a well appearing obese male, alert and oriented x 3 today.   Head- normocephalic, atraumatic Eyes-  Sclera clear, conjunctiva pink Ears- hearing intact Oropharynx- clear Neck- supple  Lungs- Clear to ausculation bilaterally, normal work of breathing Heart- irregular rate and rhythm, no murmurs, rubs or gallops  GI- soft, NT, ND, + BS Extremities- no clubbing, cyanosis, or edema MS- no significant deformity or atrophy Skin- no rash or lesion Psych- euthymic mood, full affect Neuro- strength and sensation are intact  Wt Readings from Last 3 Encounters:  01/17/22 92.4 kg  12/27/20 95.8 kg  12/10/19 95.3 kg    EKG today demonstrates  Coarse afib vs atypical atrial flutter with variable block Vent. rate 69 BPM PR interval * ms QRS duration 106 ms QT/QTcB 410/439 ms  Echo 03/04/14 demonstrated  Good news: Essentially normal echocardiogram and normal pump function and normal valve function.  No signs to suggest heart attack.. EF: 55-60%. No regional wall motion abnormalities  Epic records are reviewed at length today  CHA2DS2-VASc Score = 3  The patient's score is based upon: CHF History: 0 HTN History: 1 Diabetes History: 0 Stroke History: 0 Vascular Disease History: 1 Age  Score: 1 Gender Score: 0       ASSESSMENT AND PLAN: 1. Persistent Atrial Fibrillation (ICD10:  I48.19) The patient's CHA2DS2-VASc score is 3, indicating a 3.2% annual risk of stroke.   Patient has not converted with amiodarone loading. Will arrange for DCCV.  Continue amiodarone 200 mg BID until 1/27 then decrease to 200 mg daily. Continue Toprol 25 mg daily Continue Eliquis 5 mg BID  2. Secondary Hypercoagulable State (ICD10:  D68.69) The patient is at significant risk for stroke/thromboembolism based upon his CHA2DS2-VASc Score of 3.  Continue Apixaban (Eliquis).   3. Obesity Body mass index is 30.07 kg/m. Lifestyle modification was discussed at length including regular exercise and weight reduction.  4. HTN Stable, no changes today.  5. CAD S/p CABG No anginal symptoms.   Follow up in the AF clinic post DCCV.    [facility] [address] [place], [place] [address] [phone] 01/17/2022 2:08 PM

## 2022-01-17 NOTE — H&P (View-Only) (Signed)
° ° °Primary Care Physician: Morrow, [name], MD °Primary Cardiologist: Dr Harding °Primary Electrophysiologist: none °Referring Physician: Dr Harding ° ° °Alfred Thompson is a 76 y.o. male with a history of CAD s/p CABG 2014, HLD, HTN, atrial fibrillation who presents for consultation in the [facility]. Patient is on Eliquis for a CHADS2VASC score of 3. He was diagnosed with afib in 2014 at the time of his CABG. He was initially on amiodarone but has not been maintained on this long term. He was seen by his PCP 01/05/22 and was found to be in atrial flutter. He was restarted on amiodarone in an attempt to chemically cardiovert. Today, patient reports that he feels improved since starting amiodarone. When out of rhythm he states he "just doesn't feel right in his chest." There were no specific triggers that he could identify.  ° °Today, he denies symptoms of palpitations, chest pain, shortness of breath, orthopnea, PND, lower extremity edema, dizziness, presyncope, syncope, snoring, daytime somnolence, bleeding, or neurologic sequela. The patient is tolerating medications without difficulties and is otherwise without complaint today.  ° ° °Atrial Fibrillation Risk Factors: ° °he does not have symptoms or diagnosis of sleep apnea. °he does not have a history of rheumatic fever. °he does have a history of alcohol use. ° ° °he has a BMI of Body mass index is 30.07 kg/m².. °Filed Weights  ° 01/17/22 1353  °Weight: 92.4 kg  ° ° °Family History  °Problem Relation Age of Onset  ° Coronary artery disease Father 70  °     CABG  ° Rectal cancer Mother   ° Transient ischemic attack Mother   ° ° ° °Atrial Fibrillation Management history: ° °Previous antiarrhythmic drugs: amiodarone  °Previous cardioversions: 11/14/18 °Previous ablations: none °CHADS2VASC score: 3 °Anticoagulation history: Eliquis ° ° °Past Medical History:  °Diagnosis Date  ° Back pain, chronic   ° Resolved  ° First degree AV block  12/09/2018  ° Noted on EKG  ° Hyperlipidemia   ° on statin  ° Left main coronary artery disease 10/09/2013  ° Distal Left Main 80-90%; ostial/proximal LAD 50-80%, proximal RI 80%, proximal RCA napkin ring 80-90%; relatively normal Circumflex/Lateral OM --> referred for CABG; 2-D echo March 2015: EF 55-60% with no regional wall motion on his  ° Obesity (BMI 30-39.9)   ° Paroxysmal A-fib (HCC) 10/09/2013  ° Admitted with Afib RVR, 2 episodes post-op. -Has some strips that looks like atrial flutter  ° Pleural effusion, left 09/2013  ° S/P CABG x 4 10/10/2013  ° (LIMA to LAD, SVG to OM, SVG to RAMUS INTERMEDIATE, and SVG to RCA  ° Unstable angina (HCC) 10/09/2013  ° Venous insufficiency   ° Left leg  ° °Past Surgical History:  °Procedure Laterality Date  ° CARDIOVERSION N/A 11/14/2018  ° Procedure: CARDIOVERSION;  Surgeon: Turner, Traci R, MD;  Location: [facility];  Service: Cardiovascular;  Laterality: N/A;  ° CORONARY ARTERY BYPASS GRAFT N/A 10/10/2013  ° Procedure: CORONARY ARTERY BYPASS GRAFTING (CABG);  Surgeon: Bryan K Bartle, MD;  Location: [facility] OR;  Service: Open Heart Surgery;  Laterality: N/A;  Times 4 using left internal mammary artery and endoscopically harvested left saphenous vein  ° DUPUYTREN / PALMAR FASCIOTOMY Bilateral   ° HERNIA REPAIR    ° INTRAOPERATIVE TRANSESOPHAGEAL ECHOCARDIOGRAM N/A 10/10/2013  ° Procedure: INTRAOPERATIVE TRANSESOPHAGEAL ECHOCARDIOGRAM;  Surgeon: Bryan K Bartle, MD;  Location: [facility] OR;  Service: Open Heart Surgery;  Laterality: N/A;  ° LEFT HEART CATHETERIZATION   WITH CORONARY ANGIOGRAM N/A 10/09/2013   Procedure: LEFT HEART CATHETERIZATION WITH CORONARY ANGIOGRAM;  Surgeon: Leonie Man, MD;  Location: [facility] CATH LAB;  Distal Left Main 80-90%; ostial/proximal LAD 50-80%, proximal RI 80%, proximal RCA napkin ring 80-90%; relatively normal Circumflex/Lateral OM --> referred for CABG   NM MYOVIEW LTD  01/2017   EF 55-65%.  Subtle mild perfusion defect and apical lateral wall.   No reversibility.  Cannot exclude diaphragmatic attenuation.  LOW RISK.   PROSTATE BIOPSY N/A 01/02/2019   Procedure: BIOPSY TRANSRECTAL ULTRASONIC PROSTATE (TUBP);  Surgeon: Kathie Rhodes, MD;  Location: [facility]S;  Service: Urology;  Laterality: N/A;    Current Outpatient Medications  Medication Sig Dispense Refill   amiodarone (PACERONE) 200 MG tablet Take 2 tablets (400 mg total) by mouth 2 (two) times daily for 7 days, THEN 1 tablet (200 mg total) 2 (two) times daily for 7 days, THEN 1 tablet (200 mg total) daily for 14 days. 56 tablet 0   atorvastatin (LIPITOR) 20 MG tablet TAKE 1 TABLET BY MOUTH EVERY DAY 90 tablet 2   ELIQUIS 5 MG TABS tablet TAKE 1 TABLET BY MOUTH TWICE A DAY 180 tablet 1   metoprolol succinate (TOPROL-XL) 25 MG 24 hr tablet TAKE 1 TABLET BY MOUTH EVERY DAY 90 tablet 3   potassium chloride (KLOR-CON) 10 MEQ tablet Take 1 tablet (10 mEq total) by mouth daily. 90 tablet 3   No current facility-administered medications for this encounter.    No Known Allergies  Social History   Socioeconomic History   Marital status: Married    Spouse name: Not on file   Number of children: Not on file   Years of education: Not on file   Highest education level: Not on file  Occupational History   Not on file  Tobacco Use   Smoking status: Never   Smokeless tobacco: Never  Vaping Use   Vaping Use: Never used  Substance and Sexual Activity   Alcohol use: Not Currently    Alcohol/week: 1.0 standard drink    Types: 1 Cans of beer per week    Comment: once a week 1 beer 01/17/22   Drug use: No   Sexual activity: Not on file  Other Topics Concern   Not on file  Social History Narrative   Married, one son, no grandchildren.    He drives for [employer]      -> He frequently mentions that he was a former middle distance runner up until maybe a year or so before his MI.      December 2020: Continues to stay active walking in the neighborhood, but not doing regular  exercise.  His usual exercise routine is curtailed by COVID-19, but he says he walks about a half a mile to a mile a day.  When he comes to he will, he actually tries to increase his speed going up it to try to challenge himself.   Social Determinants of Health   Financial Resource Strain: Not on file  Food Insecurity: Not on file  Transportation Needs: Not on file  Physical Activity: Not on file  Stress: Not on file  Social Connections: Not on file  Intimate Partner Violence: Not on file     ROS- All systems are reviewed and negative except as per the HPI above.  Physical Exam: Vitals:   01/17/22 1353  BP: 112/72  Pulse: 69  Weight: 92.4 kg  Height: 5\' 9"  (1.753 m)  GEN- The patient is a well appearing obese male, alert and oriented x 3 today.   Head- normocephalic, atraumatic Eyes-  Sclera clear, conjunctiva pink Ears- hearing intact Oropharynx- clear Neck- supple  Lungs- Clear to ausculation bilaterally, normal work of breathing Heart- irregular rate and rhythm, no murmurs, rubs or gallops  GI- soft, NT, ND, + BS Extremities- no clubbing, cyanosis, or edema MS- no significant deformity or atrophy Skin- no rash or lesion Psych- euthymic mood, full affect Neuro- strength and sensation are intact  Wt Readings from Last 3 Encounters:  01/17/22 92.4 kg  12/27/20 95.8 kg  12/10/19 95.3 kg    EKG today demonstrates  Coarse afib vs atypical atrial flutter with variable block Vent. rate 69 BPM PR interval * ms QRS duration 106 ms QT/QTcB 410/439 ms  Echo 03/04/14 demonstrated  Good news: Essentially normal echocardiogram and normal pump function and normal valve function.  No signs to suggest heart attack.. EF: 55-60%. No regional wall motion abnormalities  Epic records are reviewed at length today  CHA2DS2-VASc Score = 3  The patient's score is based upon: CHF History: 0 HTN History: 1 Diabetes History: 0 Stroke History: 0 Vascular Disease History: 1 Age  Score: 1 Gender Score: 0       ASSESSMENT AND PLAN: 1. Persistent Atrial Fibrillation (ICD10:  I48.19) The patient's CHA2DS2-VASc score is 3, indicating a 3.2% annual risk of stroke.   Patient has not converted with amiodarone loading. Will arrange for DCCV.  Continue amiodarone 200 mg BID until 1/27 then decrease to 200 mg daily. Continue Toprol 25 mg daily Continue Eliquis 5 mg BID  2. Secondary Hypercoagulable State (ICD10:  D68.69) The patient is at significant risk for stroke/thromboembolism based upon his CHA2DS2-VASc Score of 3.  Continue Apixaban (Eliquis).   3. Obesity Body mass index is 30.07 kg/m. Lifestyle modification was discussed at length including regular exercise and weight reduction.  4. HTN Stable, no changes today.  5. CAD S/p CABG No anginal symptoms.   Follow up in the AF clinic post DCCV.    [facility] [address] [place], [place] [address] [phone] 01/17/2022 2:08 PM

## 2022-01-17 NOTE — Patient Instructions (Signed)
Cardioversion scheduled for Wednesday, February 8th  - Arrive at the [facility] and go to admitting at 1130AM  - Do not eat or drink anything after midnight the night prior to your procedure.  - Take all your morning medication (except diabetic medications) with a sip of water prior to arrival.  - You will not be able to drive home after your procedure.  - Do NOT miss any doses of your blood thinner - if you should miss a dose please notify our office immediately.  - If you feel as if you go back into normal rhythm prior to scheduled cardioversion, please notify our office immediately. If your procedure is canceled in the cardioversion suite you will be charged a cancellation fee. Patients will be asked to: to mask in public and hand hygiene (no longer quarantine) in the 3 days prior to surgery, to report if any COVID-19-like illness or household contacts to COVID-19 to determine need for testing

## 2022-01-23 ENCOUNTER — Encounter (HOSPITAL_COMMUNITY): Payer: Self-pay | Admitting: Internal Medicine

## 2022-01-23 DIAGNOSIS — M25512 Pain in left shoulder: Secondary | ICD-10-CM | POA: Diagnosis not present

## 2022-01-25 ENCOUNTER — Other Ambulatory Visit: Payer: Self-pay | Admitting: Cardiology

## 2022-01-31 ENCOUNTER — Ambulatory Visit (HOSPITAL_COMMUNITY)
Admission: RE | Admit: 2022-01-31 | Discharge: 2022-01-31 | Disposition: A | Discharge status: Home / Self Care | Payer: PPO | Attending: Internal Medicine | Admitting: Internal Medicine

## 2022-01-31 ENCOUNTER — Encounter (HOSPITAL_COMMUNITY)
Admission: RE | Disposition: A | Discharge status: Home / Self Care | Payer: Self-pay | Source: Home / Self Care | Attending: Internal Medicine

## 2022-01-31 ENCOUNTER — Other Ambulatory Visit: Payer: Self-pay

## 2022-01-31 ENCOUNTER — Encounter (HOSPITAL_COMMUNITY): Payer: Self-pay | Admitting: Internal Medicine

## 2022-01-31 ENCOUNTER — Ambulatory Visit (HOSPITAL_COMMUNITY): Payer: PPO | Admitting: Certified Registered"

## 2022-01-31 DIAGNOSIS — I2510 Atherosclerotic heart disease of native coronary artery without angina pectoris: Secondary | ICD-10-CM | POA: Insufficient documentation

## 2022-01-31 DIAGNOSIS — E669 Obesity, unspecified: Secondary | ICD-10-CM | POA: Insufficient documentation

## 2022-01-31 DIAGNOSIS — Z6830 Body mass index (BMI) 30.0-30.9, adult: Secondary | ICD-10-CM | POA: Insufficient documentation

## 2022-01-31 DIAGNOSIS — D6869 Other thrombophilia: Secondary | ICD-10-CM | POA: Insufficient documentation

## 2022-01-31 DIAGNOSIS — I4819 Other persistent atrial fibrillation: Secondary | ICD-10-CM | POA: Diagnosis not present

## 2022-01-31 DIAGNOSIS — I10 Essential (primary) hypertension: Secondary | ICD-10-CM | POA: Insufficient documentation

## 2022-01-31 DIAGNOSIS — Z951 Presence of aortocoronary bypass graft: Secondary | ICD-10-CM | POA: Diagnosis not present

## 2022-01-31 DIAGNOSIS — E785 Hyperlipidemia, unspecified: Secondary | ICD-10-CM | POA: Insufficient documentation

## 2022-01-31 DIAGNOSIS — I4891 Unspecified atrial fibrillation: Secondary | ICD-10-CM | POA: Diagnosis not present

## 2022-01-31 DIAGNOSIS — Z79899 Other long term (current) drug therapy: Secondary | ICD-10-CM | POA: Diagnosis not present

## 2022-01-31 DIAGNOSIS — Z7901 Long term (current) use of anticoagulants: Secondary | ICD-10-CM | POA: Diagnosis not present

## 2022-01-31 HISTORY — PX: CARDIOVERSION: SHX1299

## 2022-01-31 SURGERY — CARDIOVERSION
Anesthesia: General

## 2022-01-31 MED ORDER — LIDOCAINE 2% (20 MG/ML) 5 ML SYRINGE
INTRAMUSCULAR | Status: DC | PRN
  Administered 2022-01-31: 100 mg via INTRAVENOUS

## 2022-01-31 MED ORDER — PROPOFOL 10 MG/ML IV BOLUS
INTRAVENOUS | Status: DC | PRN
Start: 2022-01-31 — End: 2022-01-31
  Administered 2022-01-31: 70 mg via INTRAVENOUS

## 2022-01-31 MED ORDER — SODIUM CHLORIDE 0.9 % IV SOLN: INTRAVENOUS | Status: DC

## 2022-01-31 NOTE — CV Procedure (Signed)
° °  CARDIOVERSION NOTE  Procedure: Electrical Cardioversion Indications:  Atrial Fibrillation  Procedure Details:  Consent: Risks of procedure as well as the alternatives and risks of each were explained to the (patient/caregiver).  Consent for procedure obtained.  Time Out: Verified patient identification, verified procedure, site/side was marked, verified correct patient position, special equipment/implants available, medications/allergies/relevent history reviewed, required imaging and test results available.  Performed  Patient placed on cardiac monitor, pulse oximetry, supplemental oxygen as necessary.  Sedation given:  propofol per anesthesia Pacer pads placed anterior and posterior chest.  Cardioverted 1 time(s).  Cardioverted at 150Jbiphasic.  Impression: Findings: Post procedure EKG shows: NSR Complications: None Patient did tolerate procedure well.  Plan: Successful DCCV with a single 150J biphasic shock to NSR.  Time Spent Directly with the Patient:  30 minutes   Chrystie Nose, MD, [facility], FACP  [place]   [facility]  Medical Director of the Advanced Lipid Disorders &  Cardiovascular Risk Reduction Clinic Diplomate of the American Board of Clinical Lipidology Attending Cardiologist  Direct Dial: [phone]   Fax: [phone]  Website:  [email or web address] [email or web address] [email or web address] 01/31/2022, 12:10 PM

## 2022-01-31 NOTE — Transfer of Care (Signed)
Immediate Anesthesia Transfer of Care Note  Patient: Alfred Thompson  Procedure(s) Performed: CARDIOVERSION  Patient Location: Endoscopy Unit  Anesthesia Type:General  Level of Consciousness: drowsy and patient cooperative  Airway & Oxygen Therapy: Patient Spontanous Breathing  Post-op Assessment: Report given to RN and Post -op Vital signs reviewed and stable  Post vital signs: Reviewed and stable  Last Vitals:  Vitals Value Taken Time  BP    Temp    Pulse    Resp    SpO2      Last Pain:  Vitals:   01/31/22 1135  TempSrc: Temporal  PainSc: 0-No pain         Complications: No notable events documented.

## 2022-01-31 NOTE — Interval H&P Note (Signed)
History and Physical Interval Note:  01/31/2022 11:38 AM  Alfred Thompson  has presented today for surgery, with the diagnosis of AFIB.  The various methods of treatment have been discussed with the patient and family. After consideration of risks, benefits and other options for treatment, the patient has consented to  Procedure(s): CARDIOVERSION (N/A) as a surgical intervention.  The patient's history has been reviewed, patient examined, no change in status, stable for surgery.  I have reviewed the patient's chart and labs.  Questions were answered to the patient's satisfaction.     Chrystie Nose

## 2022-01-31 NOTE — Anesthesia Preprocedure Evaluation (Addendum)
Anesthesia Evaluation  Patient identified by MRN, date of birth, ID band Patient awake    Reviewed: Allergy & Precautions, NPO status , Patient's Chart, lab work & pertinent test results  History of Anesthesia Complications Negative for: history of anesthetic complications  Airway Mallampati: II  TM Distance: >3 FB Neck ROM: Full    Dental no notable dental hx.    Pulmonary neg pulmonary ROS,    Pulmonary exam normal        Cardiovascular + CAD and + CABG (2014)  Normal cardiovascular exam+ dysrhythmias (on Eliquis) Atrial Fibrillation   Nuclear stress 2018: EF 57%, low risk study    Neuro/Psych negative neurological ROS  negative psych ROS   GI/Hepatic negative GI ROS, Neg liver ROS,   Endo/Other  negative endocrine ROS  Renal/GU negative Renal ROS  negative genitourinary   Musculoskeletal negative musculoskeletal ROS (+)   Abdominal   Peds  Hematology negative hematology ROS (+)   Anesthesia Other Findings Day of surgery medications reviewed with patient.  Reproductive/Obstetrics negative OB ROS                            Anesthesia Physical Anesthesia Plan  ASA: 3  Anesthesia Plan: General   Post-op Pain Management: Minimal or no pain anticipated   Induction: Intravenous  PONV Risk Score and Plan: 2 and Treatment may vary due to age or medical condition and Propofol infusion  Airway Management Planned: Mask  Additional Equipment: None  Intra-op Plan:   Post-operative Plan:   Informed Consent: I have reviewed the patients History and Physical, chart, labs and discussed the procedure including the risks, benefits and alternatives for the proposed anesthesia with the patient or authorized representative who has indicated his/her understanding and acceptance.       Plan Discussed with: CRNA  Anesthesia Plan Comments:        Anesthesia Quick Evaluation

## 2022-01-31 NOTE — Discharge Instructions (Signed)

## 2022-01-31 NOTE — Anesthesia Postprocedure Evaluation (Signed)
Anesthesia Post Note  Patient: Alfred Thompson  Procedure(s) Performed: CARDIOVERSION     Patient location during evaluation: PACU Anesthesia Type: General Level of consciousness: awake and alert Pain management: pain level controlled Vital Signs Assessment: post-procedure vital signs reviewed and stable Respiratory status: spontaneous breathing, nonlabored ventilation and respiratory function stable Cardiovascular status: blood pressure returned to baseline Postop Assessment: no apparent nausea or vomiting Anesthetic complications: no   No notable events documented.  Last Vitals:  Vitals:   01/31/22 1211 01/31/22 1220  BP: 100/63 113/71  Pulse: 66 66  Resp: 18 12  Temp:    SpO2: 95% 96%    Last Pain:  Vitals:   01/31/22 1220  TempSrc:   PainSc: 0-No pain                 Shanda Howells

## 2022-02-03 ENCOUNTER — Encounter (HOSPITAL_COMMUNITY): Payer: Self-pay | Admitting: Internal Medicine

## 2022-02-07 ENCOUNTER — Encounter (HOSPITAL_COMMUNITY): Payer: Self-pay | Admitting: Physician Assistant

## 2022-02-07 ENCOUNTER — Ambulatory Visit (HOSPITAL_COMMUNITY)
Admission: RE | Admit: 2022-02-07 | Discharge: 2022-02-07 | Disposition: A | Discharge status: Home / Self Care | Payer: PPO | Source: Ambulatory Visit | Attending: Physician Assistant | Admitting: Physician Assistant

## 2022-02-07 ENCOUNTER — Other Ambulatory Visit: Payer: Self-pay

## 2022-02-07 VITALS — BP 112/62 | HR 59 | Ht 69.0 in | Wt 204.1 lb

## 2022-02-07 DIAGNOSIS — E785 Hyperlipidemia, unspecified: Secondary | ICD-10-CM | POA: Insufficient documentation

## 2022-02-07 DIAGNOSIS — Z6830 Body mass index (BMI) 30.0-30.9, adult: Secondary | ICD-10-CM | POA: Diagnosis not present

## 2022-02-07 DIAGNOSIS — Z79899 Other long term (current) drug therapy: Secondary | ICD-10-CM | POA: Insufficient documentation

## 2022-02-07 DIAGNOSIS — Z951 Presence of aortocoronary bypass graft: Secondary | ICD-10-CM | POA: Insufficient documentation

## 2022-02-07 DIAGNOSIS — Z7901 Long term (current) use of anticoagulants: Secondary | ICD-10-CM | POA: Diagnosis not present

## 2022-02-07 DIAGNOSIS — I10 Essential (primary) hypertension: Secondary | ICD-10-CM | POA: Diagnosis not present

## 2022-02-07 DIAGNOSIS — I2510 Atherosclerotic heart disease of native coronary artery without angina pectoris: Secondary | ICD-10-CM | POA: Diagnosis not present

## 2022-02-07 DIAGNOSIS — E669 Obesity, unspecified: Secondary | ICD-10-CM | POA: Diagnosis not present

## 2022-02-07 DIAGNOSIS — I4819 Other persistent atrial fibrillation: Secondary | ICD-10-CM | POA: Diagnosis not present

## 2022-02-07 DIAGNOSIS — D6869 Other thrombophilia: Secondary | ICD-10-CM

## 2022-02-07 NOTE — Progress Notes (Signed)
Primary Care Physician: London Pepper, MD Primary Cardiologist: Dr Ellyn Hack Primary Electrophysiologist: none Referring Physician: Dr Hulan Amato is a 76 y.o. male with a history of CAD s/p CABG 2014, HLD, HTN, atrial fibrillation who presents for follow up in the [facility]. Patient is on Eliquis for a CHADS2VASC score of 3. He was diagnosed with afib in 2014 at the time of his CABG. He was initially on amiodarone but has not been maintained on this long term. He was seen by his PCP 01/05/22 and was found to be in atrial flutter. He was restarted on amiodarone in an attempt to chemically cardiovert. When out of rhythm he states he "just doesn't feel right in his chest." There were no specific triggers that he could identify.   On follow up today, patient is s/p DCCV on 01/31/22. He reports that he feels well today and remains in SR. No bleeding issues on anticoagulation.   Today, he denies symptoms of palpitations, chest pain, shortness of breath, orthopnea, PND, lower extremity edema, dizziness, presyncope, syncope, snoring, daytime somnolence, bleeding, or neurologic sequela. The patient is tolerating medications without difficulties and is otherwise without complaint today.    Atrial Fibrillation Risk Factors:  he does not have symptoms or diagnosis of sleep apnea. he does not have a history of rheumatic fever. he does have a history of alcohol use.   he has a BMI of Body mass index is 30.14 kg/m.Marland Kitchen Filed Weights   02/07/22 1358  Weight: 92.6 kg     Family History  Problem Relation Age of Onset   Coronary artery disease Father 11       CABG   Rectal cancer Mother    Transient ischemic attack Mother      Atrial Fibrillation Management history:  Previous antiarrhythmic drugs: amiodarone  Previous cardioversions: 11/14/18, 01/31/22 Previous ablations: none CHADS2VASC score: 3 Anticoagulation history: Eliquis   Past Medical History:   Diagnosis Date   Back pain, chronic    Resolved   First degree AV block 12/09/2018   Noted on EKG   Hyperlipidemia    on statin   Left main coronary artery disease 10/09/2013   Distal Left Main 80-90%; ostial/proximal LAD 50-80%, proximal RI 80%, proximal RCA napkin ring 80-90%; relatively normal Circumflex/Lateral OM --> referred for CABG; 2-D echo March 2015: EF 55-60% with no regional wall motion on his   Obesity (BMI 30-39.9)    Paroxysmal A-fib ([place]) 10/09/2013   Admitted with Afib RVR, 2 episodes post-op. -Has some strips that looks like atrial flutter   Pleural effusion, left 09/2013   S/P CABG x 4 10/10/2013   (LIMA to LAD, SVG to OM, SVG to RAMUS INTERMEDIATE, and SVG to RCA   Unstable angina ([place]) 10/09/2013   Venous insufficiency    Left leg   Past Surgical History:  Procedure Laterality Date   CARDIOVERSION N/A 11/14/2018   Procedure: CARDIOVERSION;  Surgeon: Sueanne Margarita, MD;  Location: [facility];  Service: Cardiovascular;  Laterality: N/A;   CARDIOVERSION N/A 01/31/2022   Procedure: CARDIOVERSION;  Surgeon: Pixie Casino, MD;  Location: [facility] ENDOSCOPY;  Service: Cardiovascular;  Laterality: N/A;   CORONARY ARTERY BYPASS GRAFT N/A 10/10/2013   Procedure: CORONARY ARTERY BYPASS GRAFTING (CABG);  Surgeon: Gaye Pollack, MD;  Location: [facility];  Service: Open Heart Surgery;  Laterality: N/A;  Times 4 using left internal mammary artery and endoscopically harvested left saphenous vein   DUPUYTREN /  PALMAR FASCIOTOMY Bilateral    HERNIA REPAIR     INTRAOPERATIVE TRANSESOPHAGEAL ECHOCARDIOGRAM N/A 10/10/2013   Procedure: INTRAOPERATIVE TRANSESOPHAGEAL ECHOCARDIOGRAM;  Surgeon: Alleen BorneBryan K Bartle, MD;  Location: [facility] OR;  Service: Open Heart Surgery;  Laterality: N/A;   LEFT HEART CATHETERIZATION WITH CORONARY ANGIOGRAM N/A 10/09/2013   Procedure: LEFT HEART CATHETERIZATION WITH CORONARY ANGIOGRAM;  Surgeon: Marykay Lexavid W Harding, MD;  [facility];  Distal Left Main 80-90%;  ostial/proximal LAD 50-80%, proximal RI 80%, proximal RCA napkin ring 80-90%; relatively normal Circumflex/Lateral OM --> referred for CABG   NM MYOVIEW LTD  01/2017   EF 55-65%.  Subtle mild perfusion defect and apical lateral wall.  No reversibility.  Cannot exclude diaphragmatic attenuation.  LOW RISK.   PROSTATE BIOPSY N/A 01/02/2019   Procedure: BIOPSY TRANSRECTAL ULTRASONIC PROSTATE (TUBP);  Surgeon: Ihor Gullyttelin, Mark, MD;  Location: [facility] ORS;  Service: Urology;  Laterality: N/A;    Current Outpatient Medications  Medication Sig Dispense Refill   acetaminophen (TYLENOL) 500 MG tablet Take 1,000 mg by mouth every 6 (six) hours as needed for moderate pain.     atorvastatin (LIPITOR) 20 MG tablet TAKE 1 TABLET BY MOUTH EVERY DAY 90 tablet 2   ELIQUIS 5 MG TABS tablet TAKE 1 TABLET BY MOUTH TWICE A DAY 180 tablet 1   metoprolol succinate (TOPROL-XL) 25 MG 24 hr tablet TAKE 1 TABLET BY MOUTH EVERY DAY 90 tablet 3   potassium chloride (KLOR-CON) 10 MEQ tablet Take 1 tablet (10 mEq total) by mouth daily. 90 tablet 3   No current facility-administered medications for this encounter.    No Known Allergies  Social History   Socioeconomic History   Marital status: Married    Spouse name: Not on file   Number of children: Not on file   Years of education: Not on file   Highest education level: Not on file  Occupational History   Not on file  Tobacco Use   Smoking status: Never   Smokeless tobacco: Never  Vaping Use   Vaping Use: Never used  Substance and Sexual Activity   Alcohol use: Not Currently    Alcohol/week: 1.0 standard drink    Types: 1 Cans of beer per week    Comment: once a week 1 beer 01/17/22   Drug use: No   Sexual activity: Not on file  Other Topics Concern   Not on file  Social History Narrative   Married, one son, no grandchildren.    He drives for [employer]      -> He frequently mentions that he was a former middle distance runner up until maybe a year  or so before his MI.      December 2020: Continues to stay active walking in the neighborhood, but not doing regular exercise.  His usual exercise routine is curtailed by COVID-19, but he says he walks about a half a mile to a mile a day.  When he comes to he will, he actually tries to increase his speed going up it to try to challenge himself.   Social Determinants of Health   Financial Resource Strain: Not on file  Food Insecurity: Not on file  Transportation Needs: Not on file  Physical Activity: Not on file  Stress: Not on file  Social Connections: Not on file  Intimate Partner Violence: Not on file     ROS- All systems are reviewed and negative except as per the HPI above.  Physical Exam: Vitals:  02/07/22 1358  BP: 112/62  Pulse: (!) 59  Weight: 92.6 kg  Height: 5\' 9"  (1.753 m)    GEN- The patient is a well appearing obese male, alert and oriented x 3 today.   HEENT-head normocephalic, atraumatic, sclera clear, conjunctiva pink, hearing intact, trachea midline. Lungs- Clear to ausculation bilaterally, normal work of breathing Heart- Regular rate and rhythm, no murmurs, rubs or gallops  GI- soft, NT, ND, + BS Extremities- no clubbing, cyanosis, or edema MS- no significant deformity or atrophy Skin- no rash or lesion Psych- euthymic mood, full affect Neuro- strength and sensation are intact   Wt Readings from Last 3 Encounters:  02/07/22 92.6 kg  01/31/22 92.4 kg  01/17/22 92.4 kg    EKG today demonstrates  SB, 1st degree AV block, inc RBBB Vent. rate 59 BPM PR interval 220 ms QRS duration 102 ms QT/QTcB 454/449 ms  Echo 03/04/14 demonstrated  Good news: Essentially normal echocardiogram and normal pump function and normal valve function.  No signs to suggest heart attack.. EF: 55-60%. No regional wall motion abnormalities  Epic records are reviewed at length today  CHA2DS2-VASc Score = 3  The patient's score is based upon: CHF History: 0 HTN History:  1 Diabetes History: 0 Stroke History: 0 Vascular Disease History: 1 Age Score: 1 Gender Score: 0       ASSESSMENT AND PLAN: 1. Persistent Atrial Fibrillation (ICD10:  I48.19) The patient's CHA2DS2-VASc score is 3, indicating a 3.2% annual risk of stroke.   S/p DCCV 01/31/22 Patient appears to be maintaining SR. Stop amiodarone today. Could consider alternate AAD or ablation if rhythm control is needed long term. Continue Toprol 25 mg daily Continue Eliquis 5 mg BID  2. Secondary Hypercoagulable State (ICD10:  D68.69) The patient is at significant risk for stroke/thromboembolism based upon his CHA2DS2-VASc Score of 3.  Continue Apixaban (Eliquis).   3. Obesity Body mass index is 30.14 kg/m. Lifestyle modification was discussed and encouraged including regular physical activity and weight reduction.  4. HTN Stable, no changes today.  5. CAD S/p CABG No anginal symptoms.   Follow up with Dr Ellyn Hack as scheduled.    [facility] [address] [place], [place] [address] [phone] 02/07/2022 2:21 PM

## 2022-02-07 NOTE — Patient Instructions (Signed)
Stop Amiodarone 

## 2022-02-16 ENCOUNTER — Other Ambulatory Visit: Payer: Self-pay | Admitting: Cardiology

## 2022-02-20 ENCOUNTER — Other Ambulatory Visit: Payer: Self-pay | Admitting: Cardiology

## 2022-02-21 ENCOUNTER — Other Ambulatory Visit (HOSPITAL_COMMUNITY): Payer: Self-pay | Admitting: *Deleted

## 2022-03-15 ENCOUNTER — Encounter: Payer: Self-pay | Admitting: Cardiology

## 2022-03-15 NOTE — Progress Notes (Signed)
? ? ?Primary Care Provider: London Pepper, MD ?Cardiologist: Glenetta Hew, MD ?Electrophysiologist: None ? ?Clinic Note: ?Chief Complaint  ?Patient presents with  ? Follow-up  ?  Post cardioversion  ? Coronary Artery Disease  ?  No angina  ? Atrial Fibrillation/Flutter  ?  Recurrent episode back in January-referred to A-fib clinic and scheduled for cardioversion.  Now status post cardioversion.  ? ?=================================== ? ?ASSESSMENT/PLAN  ? ?Problem List Items Addressed This Visit   ? ?  ? Cardiology Problems  ? PAF (paroxysmal atrial fibrillation) ([place]): CHA2DS2Vasc = 3 (age, HTN, CAD). on Eliquis - Primary (Chronic)  ?  Diagnosed with atrial fibs/flutter at the time of his MI.  This was highly diagnosed his coronary disease.  Initially treated with amiodarone postoperatively.  No longer on amiodarone. ? ?Now status post cardioversion for recurrent atrial flutter/fib.  Feeling much better. ?Plan: ?Continue Eliquis for DOAC ?Continue low-dose Toprol ?Monitor for recurrent symptoms.  He mostly noted fatigue as opposed to true palpitations.-We will need to check EKGs when he is seen. ?  ?  ? Coronary artery disease involving native coronary artery of native heart without angina pectoris (Chronic)  ?  Remarkably, he has significant multivessel disease with left main disease that was not diagnosed until he had an episode of atrial fibrillation flutter.  He had not necessarily noted any symptoms besides probably slowing down.  He felt a similar sluggishness and fatigue while he had this recent episode of A-fib.  But did not have any anginal symptoms to speak of. ? ?He is now status post CABG with last Myoview in 2018. ? ?Plan: ?Continue statin and beta-blocker. ?Not on aspirin or Plavix because of DOAC. ?Blood pressure is well controlled does not require ACE I/ARB or calcium blocker. ?I plan to check a follow-up Myoview this year, but since he was relatively asymptomatic with atrial flutter, would hold  off for another year and reassess prior to annual follow-up with me.  (He will be seeing an APP in roughly 6 months-can order stress test at that time) ?  ?  ? Relevant Orders  ? Hepatic function panel (Completed)  ? Lipid panel (Completed)  ? Venous insufficiency of left leg (Chronic)  ?  Stable-improved. ?Continue foot elevation, leg exercises and support stockings for travel. ?  ?  ? Hyperlipidemia LDL goal <70 (Chronic)  ?  Well-controlled lipids with LDL 39 on 20 mg atorvastatin.  Tolerating well with no issues.  No changes. ? ?He is actually not sure when he is going to see his PCP next.   ?We will go ahead and assess lipid panel chemistries.Marland Kitchen  ?Continue current dose of statin. ?  ?  ? Relevant Orders  ? Hepatic function panel (Completed)  ? Lipid panel (Completed)  ? Typical atrial flutter ([facility]) (Chronic)  ?  Most recent EKG showed possible flutter now status post cardioversion.  He has intermittent flutter versus fib. ?See atrial fibrillation section ?  ?  ?  ? Other  ? History of unstable angina (Chronic)  ?  His initial presentation with angina was in the setting of atrial fibrillation/flutter and found to have multivessel disease.  He now has had an episode of atrial fibrillation and flutter without having angina.  As such, I think we can hold off on the stress test until next year. ? ?Only on low-dose beta-blocker. ?  ?  ? S/P CABG x 4 (Chronic)  ?  Doing well.  Low risk Myoview in February 2018.  Plan was to check Myoview in 2022 or 2023.  In light of the fact that he just had cardioversion done for recurrent atrial fibrillation flutter, will hold off until next year.  This can be ordered at his 12-month follow-up with an APP to be done prior to his annual (second 70-month) follow-up with me. ?  ?  ? Relevant Orders  ? Hepatic function panel (Completed)  ? Lipid panel (Completed)  ? Chronic anticoagulation, for PAF with eliquis (Chronic)  ?  This allows [place] to perform cardioversion.  Continue Eliquis.  No  bleeding issues. ?  ?  ? Secondary hypercoagulable state ([place]) (Chronic)  ? ? ?=================================== ? ?HPI:   ? ?Alfred Thompson is a 76 y.o. male with a PMH notable for LM/RCA CAD-CABG x4 (for unstable angina-in the setting of A-fib RVR-initial diagnosis of both), PAF (very symptomatic, CHA2DS2-VASc 3), left leg use deficiency, and HLD who presents today for 2-83-month follow-up after DCCV for recurrent Afib. ? ?CARDIAC HISTORY: ?Admitted for ACS with Afib RVR = Sx of CP concerning for Unstable Angina = referred for Cath ?Cardiac Cath 10/09/2013: Severe dLM 80-90% -< LAD (p-m 50-70%, after D1 70-80%, small D1 80%), ~normal LCx-Lateral OM (trifurcating); Small-mod RI 80% in larger branch. Large RCA: prox 80-90% napkin-ring lesion =< PDA&PAV-PL. EF ~55% no RWMA.   ?CABG x 4 10/10/2013: (LIMA-LAD, SVG-OM, SVG-RI, SVG-RCA) ?2D Echo March 2015: EF 55 to 60%.  No RWMA.  Mild LA dilation. ?Myoview February 2018: EF 55 to 60%.  LOW RISK.  Subtle/mild apical lateral defect (likely artifact, cannot exclude trivial ischemia) ?Recurrent Afib/Flutter Jan 2023 (PCP noted irregular heartbeat & checked EKG with ~ Flutter confirmed w/ phone note) => Afib Clinic  - DCCV 01/31/2022 ? ?I last saw Clinton Sawyer on December 27, 2020 as an annual follow-up.  He was doing well.  Other than some allergy issues in the lip no swelling no major concerns.  No recurrent A-fib/flutter and no angina or heart failure.  Tolerating Eliquis. => No med changes. ?He was actually due to see me back this January and February but has been delayed because on January 05, 2022, his PCP contacted [place] with an EKG showing that he was in A- flutter-A-fib and symptomatic. => He was given prescription for amiodarone loading and scheduled to see Mr. Fenton in the A-fib clinic. ?Initial visit was on 01/12/2022: Still in A- a flutter, but rate controlled and felt much better on amiodarone.  Scheduled for cardioversion ? ?Recent Hospitalizations: Cardioversion  01/31/2022 ? ?Post DCCV follow-up A-fib clinic on 02/07/2022 A-Fib Clinic (Mr. Marlene Lard) -> maintaining sinus rhythm.  He was continued on 25 mg Toprol for rate control along with Eliquis.  Amiodarone discontinued. ? ?Reviewed  CV studies:   ? ?The following studies were reviewed today: (if available, images/films reviewed: From Epic Chart or Care Everywhere) ?DCCV 01/31/2022-Dr. Hilty: 1 shock 150 J biphasic restoring NSR ? ?Interval History:  ? ?Clinton Sawyer returns here today for his delayed annual follow-up after cardioversion.  ?He actually said that he feels better having had cardioversion.  He had been feeling well but tired and worn out when he went to go see his PCP and thought it was if he was getting enough sleep.  But now that he has been cardioverted he has better energy level.  Yes he wants to get back into the gym.  He had been less active and not exercising, probably for several months leading up to this cardioversion.  Now he actually feels pretty good. ? ?He denies any chest pain or pressure with rest or exertion.  He did not have any chest discomfort or any significant symptoms with the atrial fibrillation, just fatigue and dyspnea.  Now he is in sinus rhythm, his energy is better and dyspnea improved. ? ?He is also excited about COVID-19 restrictions coming down.  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  He sleeps a lot during the day, and wakes up at about noon to get going. ? ?CV Review of Symptoms (Summary) ?Cardiovascular ROS: no chest pain or dyspnea on exertion ?positive for - Rare skipping beats but nothing prolonged.  No more fatigue. ?negative for - edema, orthopnea, paroxysmal nocturnal dyspnea, rapid heart rate, shortness of breath, or lightheadedness, dizziness or wooziness, syncope/near syncope or TIA/amaurosis fugax, claudication ? ?REVIEWED OF SYSTEMS  ? ?Review of Systems  ?Constitutional:  Negative for malaise/fatigue (only when in  Afib) and weight loss.  ?HENT:  Negative for congestion and nosebleeds.   ?Cardiovascular:  Positive for leg swelling (L>R).  ?Gastrointestinal:  Negative for blood in stool and melena.  ?Genitourinary:

## 2022-03-20 ENCOUNTER — Ambulatory Visit (INDEPENDENT_AMBULATORY_CARE_PROVIDER_SITE_OTHER): Payer: PPO | Admitting: Cardiology

## 2022-03-20 ENCOUNTER — Other Ambulatory Visit: Payer: Self-pay

## 2022-03-20 ENCOUNTER — Encounter: Payer: Self-pay | Admitting: Cardiology

## 2022-03-20 VITALS — BP 118/60 | HR 65 | Ht 69.0 in | Wt 205.5 lb

## 2022-03-20 DIAGNOSIS — D6869 Other thrombophilia: Secondary | ICD-10-CM | POA: Diagnosis not present

## 2022-03-20 DIAGNOSIS — I480 Paroxysmal atrial fibrillation: Secondary | ICD-10-CM

## 2022-03-20 DIAGNOSIS — I872 Venous insufficiency (chronic) (peripheral): Secondary | ICD-10-CM

## 2022-03-20 DIAGNOSIS — I483 Typical atrial flutter: Secondary | ICD-10-CM | POA: Diagnosis not present

## 2022-03-20 DIAGNOSIS — I2510 Atherosclerotic heart disease of native coronary artery without angina pectoris: Secondary | ICD-10-CM

## 2022-03-20 DIAGNOSIS — Z8679 Personal history of other diseases of the circulatory system: Secondary | ICD-10-CM | POA: Diagnosis not present

## 2022-03-20 DIAGNOSIS — Z7901 Long term (current) use of anticoagulants: Secondary | ICD-10-CM | POA: Diagnosis not present

## 2022-03-20 DIAGNOSIS — Z951 Presence of aortocoronary bypass graft: Secondary | ICD-10-CM | POA: Diagnosis not present

## 2022-03-20 DIAGNOSIS — E785 Hyperlipidemia, unspecified: Secondary | ICD-10-CM

## 2022-03-20 LAB — LIPID PANEL
Chol/HDL Ratio: 2.5 ratio (ref 0.0–5.0)
Cholesterol, Total: 103 mg/dL (ref 100–199)
HDL: 41 mg/dL (ref >39)
LDL Chol Calc (NIH): 47 mg/dL (ref 0–99)
Triglycerides: 67 mg/dL (ref 0–149)
VLDL Cholesterol Cal: 15 mg/dL (ref 5–40)

## 2022-03-20 LAB — HEPATIC FUNCTION PANEL
ALT: 27 IU/L (ref 0–44)
AST: 25 IU/L (ref 0–40)
Albumin: 4.7 g/dL (ref 3.7–4.7)
Alkaline Phosphatase: 68 IU/L (ref 44–121)
Bilirubin Total: 1 mg/dL (ref 0.0–1.2)
Bilirubin, Direct: 0.28 mg/dL (ref 0.00–0.40)
Total Protein: 6.8 g/dL (ref 6.0–8.5)

## 2022-03-20 NOTE — Patient Instructions (Signed)
Medication Instructions:  ? No changes  ? ?*If you need a refill on your cardiac medications before your next appointment, please call your pharmacy* ? ? ?Lab Work:today  ?Lipid ?Liver panel ?If you have labs (blood work) drawn today and your tests are completely normal, you will receive your results only by: ?MyChart Message (if you have MyChart) OR ?A paper copy in the mail ?If you have any lab test that is abnormal or we need to change your treatment, we will call you to review the results. ? ? ?Testing/Procedures: ?Not needed ? ? ?Follow-Up: ?At [facility], you and your health needs are our priority.  As part of our continuing mission to provide you with exceptional heart care, we have created designated Provider Care Teams.  These Care Teams include your primary Cardiologist (physician) and Advanced Practice Providers (APPs -  Physician Assistants and Nurse Practitioners) who all work together to provide you with the care you need, when you need it. ? ?We recommend signing up for the patient portal called "MyChart".  Sign up information is provided on this After Visit Summary.  MyChart is used to connect with patients for Virtual Visits (Telemedicine).  Patients are able to view lab/test results, encounter notes, upcoming appointments, etc.  Non-urgent messages can be sent to your provider as well.   ?To learn more about what you can do with MyChart, go to [email or web address].   ? ?Your next appointment:   ?6 month(s) ? ?The format for your next appointment:   ?In Person ? ?Provider:   ?Almyra Deforest, PA-C    Then, Glenetta Hew, MD will plan to see you again in 12 month(s).  ? ? ? ?

## 2022-03-24 ENCOUNTER — Encounter: Payer: Self-pay | Admitting: Cardiology

## 2022-03-24 NOTE — Assessment & Plan Note (Signed)
Diagnosed with atrial fibs/flutter at the time of his MI.  This was highly diagnosed his coronary disease.  Initially treated with amiodarone postoperatively.  No longer on amiodarone. ? ?Now status post cardioversion for recurrent atrial flutter/fib.  Feeling much better. ?Plan: ?? Continue Eliquis for DOAC ?? Continue low-dose Toprol ?? Monitor for recurrent symptoms.  He mostly noted fatigue as opposed to true palpitations.-We will need to check EKGs when he is seen. ?

## 2022-03-24 NOTE — Assessment & Plan Note (Signed)
Remarkably, he has significant multivessel disease with left main disease that was not diagnosed until he had an episode of atrial fibrillation flutter.  He had not necessarily noted any symptoms besides probably slowing down.  He felt a similar sluggishness and fatigue while he had this recent episode of A-fib.  But did not have any anginal symptoms to speak of. ? ?He is now status post CABG with last Myoview in 2018. ? ?Plan: ?? Continue statin and beta-blocker. ?? Not on aspirin or Plavix because of DOAC. ?? Blood pressure is well controlled does not require ACE I/ARB or calcium blocker. ?? I plan to check a follow-up Myoview this year, but since he was relatively asymptomatic with atrial flutter, would hold off for another year and reassess prior to annual follow-up with me.  (He will be seeing an APP in roughly 6 months-can order stress test at that time) ?

## 2022-03-24 NOTE — Assessment & Plan Note (Signed)
Most recent EKG showed possible flutter now status post cardioversion.  He has intermittent flutter versus fib. ?See atrial fibrillation section ?

## 2022-03-24 NOTE — Assessment & Plan Note (Signed)
His initial presentation with angina was in the setting of atrial fibrillation/flutter and found to have multivessel disease.  He now has had an episode of atrial fibrillation and flutter without having angina.  As such, I think we can hold off on the stress test until next year. ? ?Only on low-dose beta-blocker. ?

## 2022-03-24 NOTE — Assessment & Plan Note (Addendum)
Well-controlled lipids with LDL 39 on 20 mg atorvastatin.  Tolerating well with no issues.  No changes. ? ?He is actually not sure when he is going to see his PCP next.   ?? We will go ahead and assess lipid panel chemistries.Marland Kitchen  ?? Continue current dose of statin. ?

## 2022-03-24 NOTE — Assessment & Plan Note (Signed)
This allows [place] to perform cardioversion.  Continue Eliquis.  No bleeding issues. ?

## 2022-03-24 NOTE — Assessment & Plan Note (Signed)
Doing well.  Low risk Myoview in February 2018.  Plan was to check Myoview in 2022 or 2023.  In light of the fact that he just had cardioversion done for recurrent atrial fibrillation flutter, will hold off until next year.  This can be ordered at his 26-month follow-up with an APP to be done prior to his annual (second 18-month) follow-up with me. ?

## 2022-03-24 NOTE — Assessment & Plan Note (Signed)
Stable-improved. ?Continue foot elevation, leg exercises and support stockings for travel. ?

## 2022-04-19 ENCOUNTER — Other Ambulatory Visit: Payer: Self-pay | Admitting: Cardiology

## 2022-04-19 NOTE — Telephone Encounter (Signed)
Prescription refill request for Eliquis received. ?Indication:Afib ?Last office visit:3/23 ?Scr:1.1 ?Age: 76 ?Weight:93.2 kg ? ?Prescription refilled ? ?

## 2022-05-15 ENCOUNTER — Other Ambulatory Visit: Payer: Self-pay | Admitting: Cardiology

## 2022-08-17 DIAGNOSIS — M25561 Pain in right knee: Secondary | ICD-10-CM | POA: Diagnosis not present

## 2022-09-12 ENCOUNTER — Other Ambulatory Visit: Payer: Self-pay | Admitting: Cardiology

## 2022-11-23 ENCOUNTER — Other Ambulatory Visit: Payer: Self-pay | Admitting: Cardiology

## 2022-11-23 NOTE — Telephone Encounter (Signed)
Prescription refill request for Eliquis received. Indication:afib Last office visit:3/23 Scr:1.1 Age: 76 Weight:93.2  kg  Prescription refilled

## 2023-01-02 ENCOUNTER — Telehealth: Payer: Self-pay | Admitting: Cardiology

## 2023-01-02 MED ORDER — METOPROLOL SUCCINATE ER 25 MG PO TB24: 25.0000 mg | ORAL_TABLET | Freq: Every day | ORAL | 1 refills | Status: DC

## 2023-01-02 NOTE — Telephone Encounter (Signed)
*  STAT* If patient is at the pharmacy, call can be transferred to refill team.   1. Which medications need to be refilled? (please list name of each medication and dose if known) metoprolol succinate (TOPROL-XL) 25 MG 24 hr tablet   2. Which pharmacy/location (including street and city if local pharmacy) is medication to be sent to? [id number] - [place], [place] - [address]   3. Do they need a 30 day or 90 day supply? 90 day  Patient is completely out of the medication. He has an appointment 03/22/2023.

## 2023-01-09 DIAGNOSIS — Z0000 Encounter for general adult medical examination without abnormal findings: Secondary | ICD-10-CM | POA: Diagnosis not present

## 2023-01-09 DIAGNOSIS — I4891 Unspecified atrial fibrillation: Secondary | ICD-10-CM | POA: Diagnosis not present

## 2023-01-09 DIAGNOSIS — Z1211 Encounter for screening for malignant neoplasm of colon: Secondary | ICD-10-CM | POA: Diagnosis not present

## 2023-01-09 DIAGNOSIS — D6869 Other thrombophilia: Secondary | ICD-10-CM | POA: Diagnosis not present

## 2023-01-09 DIAGNOSIS — Z23 Encounter for immunization: Secondary | ICD-10-CM | POA: Diagnosis not present

## 2023-01-09 DIAGNOSIS — I10 Essential (primary) hypertension: Secondary | ICD-10-CM | POA: Diagnosis not present

## 2023-01-09 DIAGNOSIS — E785 Hyperlipidemia, unspecified: Secondary | ICD-10-CM | POA: Diagnosis not present

## 2023-02-20 ENCOUNTER — Other Ambulatory Visit: Payer: Self-pay | Admitting: Cardiology

## 2023-03-22 ENCOUNTER — Ambulatory Visit: Payer: PPO | Admitting: Adult Health

## 2023-05-18 ENCOUNTER — Other Ambulatory Visit: Payer: Self-pay | Admitting: Cardiology

## 2023-05-18 DIAGNOSIS — I480 Paroxysmal atrial fibrillation: Secondary | ICD-10-CM

## 2023-05-21 MED ORDER — POTASSIUM CHLORIDE CRYS ER 10 MEQ PO TBCR: 10.0000 meq | EXTENDED_RELEASE_TABLET | Freq: Every day | ORAL | 3 refills | Status: DC

## 2023-05-21 NOTE — Telephone Encounter (Signed)
Prescription refill request for Eliquis received. Indication: a fib Last office visit: 03/20/22 overdue, appt scheduled Scr: 0.99 01/09/23 [facility] Age: 77 Weight: 93kg

## 2023-06-23 ENCOUNTER — Other Ambulatory Visit: Payer: Self-pay | Admitting: Cardiology

## 2023-07-08 ENCOUNTER — Other Ambulatory Visit: Payer: Self-pay | Admitting: Cardiology

## 2023-07-16 ENCOUNTER — Encounter: Payer: Self-pay | Admitting: Cardiology

## 2023-07-16 ENCOUNTER — Ambulatory Visit: Payer: PPO | Attending: Adult Health | Admitting: Cardiology

## 2023-07-16 VITALS — BP 128/70 | HR 58 | Ht 69.0 in | Wt 206.4 lb

## 2023-07-16 DIAGNOSIS — Z951 Presence of aortocoronary bypass graft: Secondary | ICD-10-CM

## 2023-07-16 DIAGNOSIS — Z8679 Personal history of other diseases of the circulatory system: Secondary | ICD-10-CM | POA: Diagnosis not present

## 2023-07-16 DIAGNOSIS — Z7901 Long term (current) use of anticoagulants: Secondary | ICD-10-CM | POA: Diagnosis not present

## 2023-07-16 DIAGNOSIS — I872 Venous insufficiency (chronic) (peripheral): Secondary | ICD-10-CM | POA: Diagnosis not present

## 2023-07-16 DIAGNOSIS — I2510 Atherosclerotic heart disease of native coronary artery without angina pectoris: Secondary | ICD-10-CM | POA: Diagnosis not present

## 2023-07-16 DIAGNOSIS — E785 Hyperlipidemia, unspecified: Secondary | ICD-10-CM

## 2023-07-16 DIAGNOSIS — I480 Paroxysmal atrial fibrillation: Secondary | ICD-10-CM

## 2023-07-16 NOTE — Progress Notes (Signed)
Cardiology Office Note:  .   Date:  07/17/2023  ID:  Alfred Thompson, DOB 1946/11/14, MRN [id number] PCP: Farris Has, MD  [facility] Providers Cardiologist:  Bryan Lemma, MD     Chief Complaint  Patient presents with   Follow-up    Delayed annual   Coronary Artery Disease    No angina    Atrial Fibrillation    No breakthrough spells    History of Present Illness: .     Alfred Thompson is a borderline obese 77 y.o. male with a PMH notable for MV/LM CAD-CABG x 4 for Unstable Angina in setting of A-fib RVR-PAF, HTN, HLD venous insufficiency who presents here for delayed annual follow-up at the request of Farris Has, MD.  CARDIAC HISTORY: Admitted for ACS with Afib RVR = Sx of CP concerning for Unstable Angina = referred for Cath Cardiac Cath 10/09/2013: Severe dLM 80-90% -< LAD (p-m 50-70%, after D1 70-80%, small D1 80%), ~normal LCx-Lateral OM (trifurcating); Small-mod RI 80% in larger branch. Large RCA: prox 80-90% napkin-ring lesion =< PDA&PAV-PL. EF ~55% no RWMA.   CABG x 4 10/10/2013: (LIMA-LAD, SVG-OM, SVG-RI, SVG-RCA) 2D Echo March 2015: EF 55 to 60%.  No RWMA.  Mild LA dilation. Myoview February 2018: EF 55 to 60%.  LOW RISK.  Subtle/mild apical lateral defect (likely artifact, cannot exclude trivial ischemia) Recurrent Afib/Flutter Jan 2023 (PCP noted irregular heartbeat & checked EKG with ~ Flutter confirmed w/ phone note) => [facility]  - DCCV 01/31/2022  Alfred Thompson was last seen on 03/24/2022 for delayed annual follow-up after DCCV on 01/31/2022.  He definitely felt better after DCCV noting that while in A-fib he felt somewhat tired and worn out.  Energy level better.  Hoping to get back to the gym.  No angina or dyspnea.  He did not really have chest discomfort or Promus symptoms with A-fib just fatigue and dyspnea.  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 => No changes made.  Plan was for follow-up  Myoview to be ordered this year.    Subjective  INTERVAL HISTORY Alfred Thompson returns today for delayed annual follow-up overall doing quite well.  He is back working as a DJ, he is more active than he had been doing very well.  He walks now he is watching his diet.  He is feeling back to his old self.  He has not had any breakthrough spells of A-fib to speak of since his last cardioversion. He denies any angina or heart failure symptoms.  No signs or symptoms of arrhythmia or lightheadedness, dizziness or wooziness.  No claudication.  Stable mild unilateral edema  ROS:  Cardiovascular ROS: no chest pain or dyspnea on exertion positive for - edema and left-sided only.  Rare skipping beats. negative for - chest pain, dyspnea on exertion, irregular heartbeat, orthopnea, paroxysmal nocturnal dyspnea, rapid heart rate, shortness of breath, or lightheadedness, dizziness or wooziness, syncope/near syncope or TIA/amaurosis fugax, claudication. Review of Systems - Negative except mild LA pain.    Objective  Studies Reviewed: Marland Kitchen   EKG Interpretation Date/Time:  Tuesday July 16 2023 15:52:45 EDT Ventricular Rate:  58 PR Interval:  216 QRS Duration:  104 QT Interval:  416 QTC Calculation: 408 R Axis:   39  Text Interpretation: Sinus bradycardia with sinus arrhythmia with 1st degree A-V block Incomplete right bundle branch block Nonspecific ST abnormality When compared with ECG of  07-Feb-2022 14:00, No significant change was found Confirmed by Bryan Lemma ([id number]) on 07/16/2023 4:03:04 PM   No new studies Past Surgical History:  Procedure  Date   CARDIOVERSION  11/14/2018 & 01/31/2022   Procedure: CARDIOVERSION;  Surgeon: Chrystie Nose, MD;  Location: [facility] ENDOSCOPY;  Service: Cardiovascular;  Laterality: N/A;   CORONARY ARTERY BYPASS GRAFT  10/10/2013   Procedure: CORONARY ARTERY BYPASS GRAFTING (CABG);  Surgeon: Alleen Borne, MD;  Location: [facility] OR;  Service: Open Heart Surgery;  Laterality:  N/A;  Times 4 using left internal mammary artery and endoscopically harvested left saphenous vein; LIMA-LAD, SVG-OM, SVG-RI, SVR-dRCA   INTRAOPERATIVE TRANSESOPHAGEAL ECHOCARDIOGRAM  10/10/2013   Procedure: INTRAOPERATIVE TRANSESOPHAGEAL ECHOCARDIOGRAM;  Surgeon: Alleen Borne, MD;  Location: [facility] OR;  Service: Open Heart Surgery;  Laterality: N/A;   LEFT HEART CATHETERIZATION WITH CORONARY ANGIOGRAM  10/09/2013   Procedure: LEFT HEART CATHETERIZATION WITH CORONARY ANGIOGRAM;  Surgeon: Marykay Lex, MD;  Location: [facility] CATH LAB;  Distal Left Main 80-90%; ostial/proximal LAD 50-80%, proximal RI 80%, proximal RCA napkin ring 80-90%; relatively normal LCx-Lat OM--> referred for CABG: LIMA-LAD, SVG-OM, SVG-RI, SVR-dRCA   NM MYOVIEW LTD  01/2017   EF 55-65%.  Subtle mild perfusion defect and apical lateral wall.  No reversibility.  Cannot exclude diaphragmatic attenuation.  LOW RISK.   TRANSTHORACIC ECHOCARDIOGRAM  02/2014   EF 55 to 60%.  No RWMA.  Mild LA dilation. (5 months post CABG)   01/09/2023: TC 102, HDL 36, LDL 46, TG 105; Hgb 15.5, Cr 0.99, K+ 4.1  Lab Results  Component Value Date   CHOL 103 03/20/2022   HDL 41 03/20/2022   LDLCALC 47 03/20/2022   TRIG 67 03/20/2022   CHOLHDL 2.5 03/20/2022   Current Meds  Medication Sig   apixaban (ELIQUIS) 5 MG TABS tablet TAKE 1 TABLET BY MOUTH TWICE A DAY   atorvastatin (LIPITOR) 20 MG tablet TAKE 1 TABLET BY MOUTH EVERY DAY   metoprolol succinate (TOPROL-XL) 25 MG 24 hr tablet Take 1 tablet (25 mg total) by mouth daily.   potassium chloride (KLOR-CON M10) 10 MEQ tablet Take 1 tablet (10 mEq total) by mouth daily.     Risk Assessment/Calculations:    CHA2DS2-VASc Score = 4   This indicates a 4.8% annual risk of stroke. The patient's score is based upon: CHF History: 0 HTN History: 1 Diabetes History: 0 Stroke History: 0 Vascular Disease History: 1 Age Score: 2 Gender Score: 0          Physical Exam:   VS:  BP 128/70 (BP Location:  Left Arm, Patient Position: Sitting, Cuff Size: Normal)   Pulse (!) 58   Ht 5\' 9"  (1.753 m)   Wt 206 lb 6.4 oz (93.6 kg)   SpO2 96%   BMI 30.48 kg/m    Wt Readings from Last 3 Encounters:  07/16/23 206 lb 6.4 oz (93.6 kg)  03/20/22 205 lb 8 oz (93.2 kg)  02/07/22 204 lb 1.6 oz (92.6 kg)    GEN: Well nourished, well developed in no acute distress; healthy.,well groomed. - mildly obese NECK: No JVD; No carotid bruits CARDIAC: Distant, but normal S1 & S2; RRR, no murmurs, rubs, gallops RESPIRATORY:  Clear to auscultation without rales, wheezing or rhonchi ; nonlabored, good air movement. ABDOMEN: Soft, non-tender, non-distended EXTREMITIES:  LLE swelling noted - mild venous stasis; No deformity ; mildly abnormal gait.     ASSESSMENT AND PLAN: .    Problem List Items Addressed This  Visit       Cardiology Problems   Venous insufficiency of left leg (Chronic)    Stable.  Overall improved.  Continue foot elevation and support stockings.      Relevant Orders   EKG 12-Lead (Completed)   PAF (paroxysmal atrial fibrillation) (HCC): CHA2DS2Vasc = 3 (age, HTN, CAD). on Eliquis (Chronic)    He actually was diagnosed with A-fib/flutter back when he had his MRI and then had an episode last year.  No further episodes since then.  Remains on 25 mg Toprol for rate control, Eliquis for CVA prophylaxis.  If he has more symptoms again, he mostly noted fatigue and some shortness of breath, if recurs, would like to get an EKG.  Could also consider Kardia-Mobile which would allow them to diagnose A-fib.      Relevant Orders   EKG 12-Lead (Completed)   MYOCARDIAL PERFUSION IMAGING   Hyperlipidemia LDL goal <70 (Chronic)    Most recent lipids showed LDL 46 on Lipitor.  Doing well.  Stable with no major symptoms.  No myalgias.    Labs followed by PCP.      Relevant Orders   EKG 12-Lead (Completed)   Coronary artery disease involving native coronary artery of native heart without angina pectoris  - Primary (Chronic)    Almost 10 years out from CABG with most recent Myoview in 2018.    Remains on low-dose beta-blocker, atorvastatin and Eliquis.  Not on aspirin because of DOAC.  Plan will be to check a Myoview prior to annual follow-up next year.      Relevant Orders   EKG 12-Lead (Completed)   MYOCARDIAL PERFUSION IMAGING     Other   S/P CABG x 4 (Chronic)    Well with no active angina symptoms.  Almost 10 years out from CABG.  Was overdue for Myoview, but with him doing so well, will wait till next year before annual follow-up.      Relevant Orders   EKG 12-Lead (Completed)   MYOCARDIAL PERFUSION IMAGING   History of unstable angina (Chronic)    Initial presentation was unstable angina/ACS in the setting of A-fib RVR.  Mild troponin elevation led to cardiac catheterization showed multivessel CAD.  No further angina since CABG      Relevant Orders   EKG 12-Lead (Completed)   Chronic anticoagulation, for PAF with eliquis (Chronic)    [place] Vascor now 4 with age of 79.  Remains on Eliquis.  Okay to hold for procedures or surgeries.  Hold for 2 days for most procedures, 3 days for more high risk procedures.      Relevant Orders   EKG 12-Lead (Completed)        Informed Consent   Shared Decision Making/Informed Consent The risks [chest pain, shortness of breath, cardiac arrhythmias, dizziness, blood pressure fluctuations, myocardial infarction, stroke/transient ischemic attack, nausea, vomiting, allergic reaction, radiation exposure, metallic taste sensation and life-threatening complications (estimated to be 1 in 10,000)], benefits (risk stratification, diagnosing coronary artery disease, treatment guidance) and alternatives of a nuclear stress test were discussed in detail with Mr. Kirtz and he agrees to proceed.      Dispo: Return in about 1 year (around 07/15/2024) for 1 Yr Follow-up.  Total time spent: 25 min spent with patient + 20 min spent charting = 45  min    Signed, Marykay Lex, MD, MS Bryan Lemma, M.D., M.S. Interventional Cardiologist  [facility]  Pager # [phone] Phone # [phone] [address]  [address] [place], [place] [address]

## 2023-07-16 NOTE — Patient Instructions (Signed)
Medication Instructions:  No changes   *If you need a refill on your cardiac medications before your next appointment, please call your pharmacy*   Lab Work:     Not needed   Testing/Procedures:  Will be schedule in  June 2025   Your doctor has scheduled you for a Myocardial Perfusion scan  to obtain information about the blood flow to your heart. The test consists of taking pictures of your heart in two phases: while resting and after a stress test.  The stress test may involve walking on a treadmill, or if you are unable to exercise adequately, you will be given a drug intended to have a similar effect on the heart to that of exercise.  The test will take approximately 3 to 4  hours to complete. How to prepare for your test: Do not eat or drink 2 hours prior to your test Do not consume products containing caffeine 12 hours prior to your test (examples: coffee (regular OR decaf), chocolate, sodas, tea) Your doctor may need you to hold certain medications prior to the test.  If so, these are listed below and should not be taken for 24 hours prior to the test.  If not listed below, you may take your medications as normal.  You may resume taking held medications on your normal schedule once the test is complete.   Meds to hold:  none  Do bring a list of your current medications with you.  If you have held any meds in preparation for the test, please bring them, as you may be required to take them once the test is completed. Do wear comfortable clothes and walking shoes.  Do not wear dresses or overalls. Do NOT wear cologne, perfume, aftershave, or fragranced lotions the day of your test (deodorants okay). If these instructions are not followed your test will have to be rescheduled.   A nuclear cardiologist will review your test, prepare a report and send it to your physician.   If you have questions or concerns about your appointment, you can call the Nuclear Cardiology department at  [phone] x [phone]. If you cannot keep your appointment, please provide 48 hours notification to avoid a possible $50.00 charge to your account.   Please arrive 15 minutes prior to your appointment time for registration and insurance purposes    Follow-Up: At [facility], you and your health needs are our priority.  As part of our continuing mission to provide you with exceptional heart care, we have created designated Provider Care Teams.  These Care Teams include your primary Cardiologist (physician) and Advanced Practice Providers (APPs -  Physician Assistants and Nurse Practitioners) who all work together to provide you with the care you need, when you need it.  We recommend signing up for the patient portal called "MyChart".  Sign up information is provided on this After Visit Summary.  MyChart is used to connect with patients for Virtual Visits (Telemedicine).  Patients are able to view lab/test results, encounter notes, upcoming appointments, etc.  Non-urgent messages can be sent to your provider as well.   To learn more about what you can do with MyChart, go to [email or web address].    Your next appointment:   12 month(s)  The format for your next appointment:   In Person  Provider:   Bryan Lemma, MD    Other Instructions

## 2023-07-16 NOTE — Assessment & Plan Note (Signed)
Stable.  Overall improved.  Continue foot elevation and support stockings.

## 2023-07-16 NOTE — Assessment & Plan Note (Signed)
He actually was diagnosed with A-fib/flutter back when he had his MRI and then had an episode last year.  No further episodes since then.  Remains on 25 mg Toprol for rate control, Eliquis for CVA prophylaxis.  If he has more symptoms again, he mostly noted fatigue and some shortness of breath, if recurs, would like to get an EKG.  Could also consider Kardia-Mobile which would allow them to diagnose A-fib.

## 2023-07-16 NOTE — Assessment & Plan Note (Signed)
Almost 10 years out from CABG with most recent Myoview in 2018.    Remains on low-dose beta-blocker, atorvastatin and Eliquis.  Not on aspirin because of DOAC.  Plan will be to check a Myoview prior to annual follow-up next year.

## 2023-07-17 NOTE — Assessment & Plan Note (Signed)
Most recent lipids showed LDL 46 on Lipitor.  Doing well.  Stable with no major symptoms.  No myalgias.    Labs followed by PCP.

## 2023-07-17 NOTE — Assessment & Plan Note (Signed)
[place] Vascor now 4 with age of 21.  Remains on Eliquis.  Okay to hold for procedures or surgeries.  Hold for 2 days for most procedures, 3 days for more high risk procedures.

## 2023-07-17 NOTE — Assessment & Plan Note (Signed)
Well with no active angina symptoms.  Almost 10 years out from CABG.  Was overdue for Myoview, but with him doing so well, will wait till next year before annual follow-up.

## 2023-07-17 NOTE — Assessment & Plan Note (Signed)
Initial presentation was unstable angina/ACS in the setting of A-fib RVR.  Mild troponin elevation led to cardiac catheterization showed multivessel CAD.  No further angina since CABG

## 2023-08-05 ENCOUNTER — Other Ambulatory Visit: Payer: Self-pay | Admitting: Cardiology

## 2023-09-30 ENCOUNTER — Other Ambulatory Visit: Payer: Self-pay | Admitting: Cardiology

## 2023-09-30 DIAGNOSIS — I480 Paroxysmal atrial fibrillation: Secondary | ICD-10-CM

## 2023-10-01 NOTE — Telephone Encounter (Signed)
Prescription refill request for Eliquis received. Indication:afib Last office visit:7/24 Scr:0.99  1/24 Age: 77 Weight:93.6  kg  Prescription refilled

## 2023-10-10 ENCOUNTER — Other Ambulatory Visit: Payer: Self-pay

## 2023-10-10 MED ORDER — METOPROLOL SUCCINATE ER 25 MG PO TB24: 25.0000 mg | ORAL_TABLET | Freq: Every day | ORAL | 2 refills | Status: DC

## 2024-01-10 DIAGNOSIS — Z0000 Encounter for general adult medical examination without abnormal findings: Secondary | ICD-10-CM | POA: Diagnosis not present

## 2024-01-10 DIAGNOSIS — I4891 Unspecified atrial fibrillation: Secondary | ICD-10-CM | POA: Diagnosis not present

## 2024-01-10 DIAGNOSIS — R972 Elevated prostate specific antigen [PSA]: Secondary | ICD-10-CM | POA: Diagnosis not present

## 2024-01-10 DIAGNOSIS — E785 Hyperlipidemia, unspecified: Secondary | ICD-10-CM | POA: Diagnosis not present

## 2024-03-20 DIAGNOSIS — R972 Elevated prostate specific antigen [PSA]: Secondary | ICD-10-CM | POA: Diagnosis not present

## 2024-03-20 DIAGNOSIS — N402 Nodular prostate without lower urinary tract symptoms: Secondary | ICD-10-CM | POA: Diagnosis not present

## 2024-04-22 ENCOUNTER — Other Ambulatory Visit: Payer: Self-pay | Admitting: Cardiology

## 2024-04-22 DIAGNOSIS — I480 Paroxysmal atrial fibrillation: Secondary | ICD-10-CM

## 2024-04-22 NOTE — Telephone Encounter (Signed)
 Pt last saw Dr Addie Holstein 07/16/23, last labs 01/10/24 Creat 0.93 at [facility] per [facility], age 78, weight 93.6kg, based on specified criteria pt is on appropriate dosage of Eliquis  5mg  BID for afib.  Will refill rx.

## 2024-05-29 ENCOUNTER — Telehealth (HOSPITAL_COMMUNITY): Payer: Self-pay | Admitting: *Deleted

## 2024-05-29 ENCOUNTER — Encounter (HOSPITAL_COMMUNITY): Payer: Self-pay | Admitting: *Deleted

## 2024-05-29 NOTE — Telephone Encounter (Signed)
 Reminder and instructions sent via [employer] for upcoming sress test on 06/15/24 at 10:30

## 2024-06-03 ENCOUNTER — Other Ambulatory Visit: Payer: Self-pay | Admitting: Cardiology

## 2024-06-03 DIAGNOSIS — I480 Paroxysmal atrial fibrillation: Secondary | ICD-10-CM

## 2024-06-03 DIAGNOSIS — Z951 Presence of aortocoronary bypass graft: Secondary | ICD-10-CM

## 2024-06-03 DIAGNOSIS — I2510 Atherosclerotic heart disease of native coronary artery without angina pectoris: Secondary | ICD-10-CM

## 2024-06-08 ENCOUNTER — Encounter (HOSPITAL_COMMUNITY): Payer: Self-pay | Admitting: *Deleted

## 2024-06-08 ENCOUNTER — Telehealth (HOSPITAL_COMMUNITY): Payer: Self-pay | Admitting: *Deleted

## 2024-06-08 NOTE — Telephone Encounter (Signed)
 Reminder and instruction letter sent via [employer] for upcoming stress test on 06/15/24

## 2024-06-15 ENCOUNTER — Ambulatory Visit (HOSPITAL_COMMUNITY)
Admission: RE | Admit: 2024-06-15 | Discharge: 2024-06-15 | Disposition: A | Discharge status: Home / Self Care | Payer: PPO | Source: Ambulatory Visit | Attending: Cardiology | Admitting: Cardiology

## 2024-06-15 ENCOUNTER — Other Ambulatory Visit: Payer: Self-pay | Admitting: *Deleted

## 2024-06-15 DIAGNOSIS — Z951 Presence of aortocoronary bypass graft: Secondary | ICD-10-CM | POA: Insufficient documentation

## 2024-06-15 DIAGNOSIS — I25119 Atherosclerotic heart disease of native coronary artery with unspecified angina pectoris: Secondary | ICD-10-CM

## 2024-06-15 DIAGNOSIS — I2510 Atherosclerotic heart disease of native coronary artery without angina pectoris: Secondary | ICD-10-CM | POA: Insufficient documentation

## 2024-06-15 DIAGNOSIS — I480 Paroxysmal atrial fibrillation: Secondary | ICD-10-CM | POA: Diagnosis not present

## 2024-06-15 HISTORY — PX: NM MYOVIEW LTD: HXRAD82

## 2024-06-15 LAB — MYOCARDIAL PERFUSION IMAGING
Angina Index: 0
Duke Treadmill Score: 4
Estimated workload: 4.6
Exercise duration (min): 4 min
Exercise duration (sec): 0 s
LV dias vol: 121 mL (ref 62–150)
LV sys vol: 43 mL (ref 4.2–5.8)
MPHR: 144 {beats}/min
Nuc Stress EF: 64 %
Peak HR: 144 {beats}/min
Percent HR: 100 %
Rest HR: 92 {beats}/min
Rest Nuclear Isotope Dose: 10.8 mCi
SDS: 6
SRS: 1
SSS: 7
ST Depression (mm): 0 mm
Stress Nuclear Isotope Dose: 32.1 mCi
TID: 0.89

## 2024-06-15 MED ORDER — TECHNETIUM TC 99M TETROFOSMIN IV KIT
10.8000 | PACK | Freq: Once | INTRAVENOUS | Status: AC | PRN
  Administered 2024-06-15: 10.8 via INTRAVENOUS

## 2024-06-15 MED ORDER — TECHNETIUM TC 99M TETROFOSMIN IV KIT
32.1000 | PACK | Freq: Once | INTRAVENOUS | Status: AC | PRN
  Administered 2024-06-15: 32.1 via INTRAVENOUS

## 2024-06-16 ENCOUNTER — Ambulatory Visit: Payer: Self-pay | Admitting: Cardiology

## 2024-06-21 ENCOUNTER — Ambulatory Visit: Payer: Self-pay | Admitting: Cardiology

## 2024-06-23 NOTE — Telephone Encounter (Signed)
-----   Message from Alm Clay sent at 06/16/2024  1:13 PM EDT ----- Myoview  stress test is again read as low risk with a small area of very mild ischemia in the lateral wall.  There is normal ejection fraction with no wall motion abnormalities.  He did appear to he was in atrial fibrillation.  Overall, pretty low risk study and it seems to be pretty similar to the last study.  This is probably best discussed in routine annual follow-up visit, and in the absence of any active angina  symptoms or worsening symptoms of exertional shortness of breath, would probably not recommend invasive evaluation.  It does not look like he has an appointment scheduled with me -would have expected it to be an annual follow-up since I saw him in July of last year.   Alm Clay, MD  ----- Message ----- From: Pietro Redell RAMAN, MD Sent: 06/15/2024   2:08 PM EDT To: Alm LELON Clay, MD

## 2024-06-23 NOTE — Telephone Encounter (Signed)
 Unable to leave a message on  voicemail - voicemail cut off as soon as the beeper went off      Delayed radiology over read shows some clustered right upper lobe pulmonary nodules that look to be inflammatory in nature.  Recommended follow-up CT in a couple months.   Also notes gallstones.   Will need to forward this to PCP via fax. He should be scheduled to be seen in the next few months, if necessary we can check it regular chest CT following that visit.   Alm Clay, MD    If patient is not having any symptoms will make appt in Aug 2025 if a slot is left.

## 2024-07-08 ENCOUNTER — Other Ambulatory Visit: Payer: Self-pay | Admitting: Cardiology

## 2024-07-29 DIAGNOSIS — Z951 Presence of aortocoronary bypass graft: Secondary | ICD-10-CM | POA: Diagnosis not present

## 2024-07-29 DIAGNOSIS — R911 Solitary pulmonary nodule: Secondary | ICD-10-CM | POA: Diagnosis not present

## 2024-07-29 DIAGNOSIS — I4891 Unspecified atrial fibrillation: Secondary | ICD-10-CM | POA: Diagnosis not present

## 2024-07-31 ENCOUNTER — Other Ambulatory Visit: Payer: Self-pay | Admitting: Cardiology

## 2024-07-31 NOTE — Telephone Encounter (Signed)
 Called left message to call back about results -- have opening on 08/10/24 for an appt to discuss test and do annual appt.

## 2024-08-04 NOTE — Telephone Encounter (Signed)
 Pt requesting a c/b in regards to results before scheduling a appt.

## 2024-08-06 ENCOUNTER — Other Ambulatory Visit: Payer: Self-pay | Admitting: Family Medicine

## 2024-08-06 DIAGNOSIS — R911 Solitary pulmonary nodule: Secondary | ICD-10-CM

## 2024-08-10 ENCOUNTER — Encounter: Payer: Self-pay | Admitting: Cardiology

## 2024-08-10 ENCOUNTER — Ambulatory Visit: Attending: Cardiology | Admitting: Cardiology

## 2024-08-10 VITALS — BP 117/61 | HR 66 | Resp 16 | Ht 69.0 in | Wt 206.0 lb

## 2024-08-10 DIAGNOSIS — Z951 Presence of aortocoronary bypass graft: Secondary | ICD-10-CM | POA: Diagnosis not present

## 2024-08-10 DIAGNOSIS — Z8679 Personal history of other diseases of the circulatory system: Secondary | ICD-10-CM | POA: Diagnosis not present

## 2024-08-10 DIAGNOSIS — D6869 Other thrombophilia: Secondary | ICD-10-CM | POA: Diagnosis not present

## 2024-08-10 DIAGNOSIS — R9439 Abnormal result of other cardiovascular function study: Secondary | ICD-10-CM

## 2024-08-10 DIAGNOSIS — E785 Hyperlipidemia, unspecified: Secondary | ICD-10-CM

## 2024-08-10 DIAGNOSIS — I480 Paroxysmal atrial fibrillation: Secondary | ICD-10-CM | POA: Diagnosis not present

## 2024-08-10 DIAGNOSIS — I872 Venous insufficiency (chronic) (peripheral): Secondary | ICD-10-CM

## 2024-08-10 DIAGNOSIS — I2510 Atherosclerotic heart disease of native coronary artery without angina pectoris: Secondary | ICD-10-CM | POA: Diagnosis not present

## 2024-08-10 DIAGNOSIS — R001 Bradycardia, unspecified: Secondary | ICD-10-CM

## 2024-08-10 DIAGNOSIS — I25119 Atherosclerotic heart disease of native coronary artery with unspecified angina pectoris: Secondary | ICD-10-CM

## 2024-08-10 NOTE — Patient Instructions (Signed)
 Medication Instructions:   No changes   *If you need a refill on your cardiac medications before your next appointment, please call your pharmacy*   Lab Work:  Not needed     Testing/Procedures: Not needed   Follow-Up: At [facility], you and your health needs are our priority.  As part of our continuing mission to provide you with exceptional heart care, we have created designated Provider Care Teams.  These Care Teams include your primary Cardiologist (physician) and Advanced Practice Providers (APPs -  Physician Assistants and Nurse Practitioners) who all work together to provide you with the care you need, when you need it.  We recommend signing up for the patient portal called MyChart.  Sign up information is provided on this After Visit Summary.  MyChart is used to connect with patients for Virtual Visits (Telemedicine).  Patients are able to view lab/test results, encounter notes, upcoming appointments, etc.  Non-urgent messages can be sent to your provider as well.   To learn more about what you can do with MyChart, go to [email or web address].    Your next appointment:   6 month(s)  The format for your next appointment:   In Person  Provider:   Josefa Beauvais, NP      Then, Alm Clay, MD will plan to see you again in 12 month(s).   Other Instructions

## 2024-08-14 ENCOUNTER — Encounter: Payer: Self-pay | Admitting: Cardiology

## 2024-08-14 DIAGNOSIS — R9439 Abnormal result of other cardiovascular function study: Secondary | ICD-10-CM | POA: Insufficient documentation

## 2024-08-14 NOTE — Assessment & Plan Note (Signed)
 Really without having to A-fib, his CHA2DS2-VASc score is only 3.  He is in A-fib today and not symptomatic. On Eliquis  for stroke risk reduction and metoprolol  succinate (Toprol -XL) 25 mg daily for heart rate control. - Continue Eliquis  for anticoagulation. - Continue Toprol  XL 25 mg daily for rate control.  -- As he is currently in A-fib, we can monitor to see if he is any progression of symptoms.  If indicated can pursue cardioversion, but for now would just allow him to be in A-fib.

## 2024-08-14 NOTE — Progress Notes (Signed)
 Cardiology Office Note:  .   Date:  08/14/2024  ID:  Alfred Thompson, DOB 01-07-1946, MRN [id number] PCP: Kip Righter, MD  [facility] Providers Cardiologist:  Alm Clay, MD Cardiology APP:  Emelia Josefa HERO, NP     Chief Complaint  Patient presents with   Follow-up    Returning essentially for annual visit but early to discuss stress test results.   Coronary Artery Disease    History of CABG.  Follow-up of stress test.  No active symptoms.   Atrial Fibrillation    Not aware of any breakthrough spells.  Not aware that he is in A-fib today.    Patient Profile: .     Alfred Thompson is a mildly obese 78 y.o. male with a PMH notable for Severe Left Main Disease diagnosed in the setting of new onset A-fib RVR with unstable angina symptoms (status post CABG x 4) with HLD who presents here for annual follow-up to discuss results of stress test.   CARDIAC HISTORY: Admitted for ACS with Afib RVR = Sx of CP concerning for Unstable Angina = referred for Cath Cardiac Cath 10/09/2013: Severe dLM 80-90% -< LAD (p-m 50-70%, after D1 70-80%, small D1 80%), ~normal LCx-Lateral OM (trifurcating); Small-mod RI 80% in larger branch. Large RCA: prox 80-90% napkin-ring lesion =< PDA&PAV-PL. EF ~55% no RWMA.   CABG x 4 10/10/2013: (LIMA-LAD, SVG-OM, SVG-RI, SVG-RCA) 2D Echo March 2015: EF 55 to 60%.  No RWMA.  Mild LA dilation. Myoview  February 2018: EF 55 to 60%.  LOW RISK.  Subtle/mild apical lateral defect (likely artifact, cannot exclude trivial ischemia) => Myoview  June 2025 low risk with subtle lateral ischemia Paroxysmal Atrial Fibrillation: 2023 (PCP noted irregular heartbeat & checked EKG with ~ Flutter confirmed w/ phone note) => [facility]  - DCCV 01/31/2022 -> definitely noted feeling better after DCCV.  Felt tired and worn out after being in A-fib.  Energy level was down.  Post cardioversion energy level better.  Back to the gym.  No angina or dyspnea.     Alfred Thompson was last seen  on July 16, 2023 for annual follow-up.  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  Active and walking.  Try to work on his diet.  Back to his old self.  No breakthrough spells of A-fib.  No angina or heart failure. => Discussed surveillance Myoview  stress testing prior to annual follow-up.  Subjective  Discussed the use of AI scribe software for clinical note transcription with the patient, who gave verbal consent to proceed. History of Present Illness  Alfred Thompson is a 78 year old male with atrial fibrillation who presents for follow-up after a stress test.  He had coronary artery bypass surgery in the past and up until this recent test, had not had a stress test since 2018.  Knee remains relatively asymptomatic, denying any resting exertional chest pain or pressure.  No CHF symptoms of PND, orthopnea with trivial edema.   His cholesterol levels were checked in January, showing a total cholesterol of 108, triglycerides 76, HDL 36, and LDL 56. He is on atorvastatin  20 mg.  He recently underwent a stress test, which showed an ejection fraction of 64% with a possible small area of lateral ischemia read as low risk.Alfred Thompson He was in atrial fibrillation during the test. No symptoms of chest pain, pressure, or tightness were reported during the test or in daily activities.  He has a history of atrial fibrillation  and is currently taking Eliquis  and metoprolol  25 mg once daily. He occasionally feels a 'different kind of beat' or 'a little skippy skip' but is not aware of when he is in or out of atrial fibrillation.  Very infrequent skipping beats but nothing prominent.  Mild bruising on Eliquis ..  Cardiovascular ROS: no chest pain or dyspnea on exertion positive for - just not as much energy as he used to had.  Mild bruising. negative for - edema, irregular heartbeat, orthopnea, palpitations, paroxysmal nocturnal dyspnea, rapid heart rate, shortness of breath, or lightheadedness, dizziness or wooziness, syncope  or near syncope, TIA or amaurosis fugax.  No recurrent A-fib symptoms.  No claudication.Alfred Thompson  He mentions that he is scheduled for a CT scan to follow up on lung nodules noted in a previous test.  He expresses frustration with the MyChart system and prefers receiving results on paper. He also mentions difficulties with phone communication, often encountering long wait times and automated messages.     Objective   Medications - Eliquis  5 mg daily - Metoprolol  Succinate (Toprol  XL)25 mg once a day  - Atorvastatin  20 mg - Klor-Con  2010 mEq daily  Studies Reviewed: Alfred Thompson   EKG Interpretation Date/Time:  Monday August 10 2024 11:47:34 EDT Ventricular Rate:  68 PR Interval:    QRS Duration:  106 QT Interval:  420 QTC Calculation: 446 R Axis:   17  Text Interpretation: Atrial fibrillation with premature ventricular or aberrantly conducted complexes Inferior-posterior infarct , age undetermined When compared with ECG of 16-Jul-2023 15:52, Atrial fibrillation has replaced Sinus rhythm Confirmed by Anner Lenis ([id number]) on 08/10/2024 12:27:09 PM    Results Results LABS Cholesterol: 108 (01/10/2024) Triglycerides: 76 (01/10/2024) HDL: 36 (01/10/2024) LDL: 56 (01/10/2024) Hemoglobin: 15.5 (01/10/2024) Creatinine: 0.93 (01/10/2024) Potassium: 4.1 (01/10/2024) TSH: 1.56 (01/10/2024)  DIAGNOSTIC Myoview  Stress Test: LOW RISK.  Normal ejection fraction 64%, mild ischemia in basal-lateral wall, atrial fibrillation during test (June 15, 2024) Myoview  Stress Test: EF 55 to 60%.  Subtle/mild apical lateral reversible defect suggesting possible diaphragmatic attenuation versus distal ischemia.  LOW RISK (February 2018) Coronary Angiography: Right coronary artery with minor irregularities, left main with 90% stenosis, multiple grafts placed (10/09/2013)   Risk Assessment/Calculations:    CHA2DS2-VASc Score =    This patients CHA2DS2-VASc Score and unadjusted Ischemic Stroke Rate (% per year) is  equal to 3.2 % stroke rate/year from a score of 3  Above score calculated as 1 point each if present [CHF, HTN, DM, Vascular=MI/PAD/Aortic Plaque, Age if 65-74, or Male] Above score calculated as 2 points each if present [Age > 75, or Stroke/TIA/TE]        Physical Exam:   VS:  BP 117/61   Pulse 66   Ht 5' 9 (1.753 m)   Wt 206 lb (93.4 kg)   SpO2 95%   BMI 30.42 kg/m    Wt Readings from Last 3 Encounters:  08/10/24 206 lb (93.4 kg)  07/16/23 206 lb 6.4 oz (93.6 kg)  03/20/22 205 lb 8 oz (93.2 kg)    GEN: Well nourished, well groomed in no acute distress; mildly obese appearing NECK: No JVD; No carotid bruits CARDIAC: Irregularly irregular rhythm with normal rate.  Normal S1, S2, no murmurs, rubs, gallops RESPIRATORY:  Clear to auscultation without rales, wheezing or rhonchi ; nonlabored, good air movement. ABDOMEN: Soft, non-tender, non-distended EXTREMITIES: Trivial ankle edema-trivial stasis dermatitis.; No deformity      ASSESSMENT AND PLAN: .    Problem List Items Addressed This  Visit       Cardiology Problems   Coronary artery disease involving native coronary artery of native heart without angina pectoris - Primary (Chronic)   Recent stress test showed mild ischemia in the basal-apical lateral wall suggesting ischemia in either a diagonal or ramus distribution.  Normal ejection fraction at 64%. (On my review of his cath films, he did have a D1 branch that was relatively small but covering a large distribution that had a 80% stenosis that could quite likely lead to a small area of ischemia since it was not grafted and there is left main disease plus disease in this branch.).  Asymptomatic with low-risk stress test. Possible graft insufficiency versus on graft to diagonal branch, but asymptomatic.  In the absence of symptoms no indication for invasive evaluation.  Simply continue medical management. - Monitor for symptoms such as chest pain or dyspnea. - If symptoms  develop, consider coronary angiography to assess grafts..  He is on minimal medical therapy having been intolerant of other medications in the past. On modest dose atorvastatin  20 mg daily with well-controlled lipids Not really hypertensive and therefore only tolerating 25 mg Toprol .  With prior issues with fatigue, would not titrate further Not on aspirin  or Plavix because of long-term Eliquis  due to A-fib.      Hypercoagulable state due to paroxysmal atrial fibrillation (HCC) (Chronic)   Chads Vascor is 3.  Since he is relatively asymptomatic now being in A-fib, I prefer to continue with Eliquis  for stroke prophylaxis.  Provided he is not symptomatic in A-fib and requiring intermittent anticoagulation, my recommendation will be to allow for holding Eliquis  2 to 3 days preop for surgery.      Hyperlipidemia LDL goal <70 (Chronic)   Cholesterol levels controlled with atorvastatin . Recent lipid panel: total cholesterol 108 mg/dL, triglycerides 76 mg/dL, HDL 36 mg/dL, LDL 56 mg/dL. - Continue atorvastatin  20 mg daily.      PAF (paroxysmal atrial fibrillation) (HCC): CHA2DS2Vasc = 3 (age, HTN, CAD). on Eliquis  (Chronic)   Really without having to A-fib, his CHA2DS2-VASc score is only 3.  He is in A-fib today and not symptomatic. On Eliquis  for stroke risk reduction and metoprolol  succinate (Toprol -XL) 25 mg daily for heart rate control. - Continue Eliquis  for anticoagulation. - Continue Toprol  XL 25 mg daily for rate control.  -- As he is currently in A-fib, we can monitor to see if he is any progression of symptoms.  If indicated can pursue cardioversion, but for now would just allow him to be in A-fib.      Venous insufficiency of left leg (Chronic)   Stable.  Continue to recommend foot elevation and support stockings.        Other   Abnormal nuclear stress test (Chronic)   He has had mild lateral ischemia noted on 2 different stress test first in 2019 and now most recently in June  2025.  Reviewing the images of his cath films with suggestive this distribution is either perfused by the ramus intermedius branch or more likely the on grafted small diagonal branch.  Since he is not having symptoms my suspicion is that this is probably the diagonal branch and we would not pursue invasive evaluation or treatment unless he were to have symptoms..      Bradycardia with 41-50 beats per minute (Chronic)   History of bradycardia.  Better with reduced dose of beta-blocker. No longer on amiodarone .      History of unstable angina (Chronic)  Unstable angina occurred in the setting of A-fib RVR that led to cardiac catheterization.  I suspect that the initial episode could have been related to coronary ischemia from severe left main disease.  Now post CABG he is not having symptoms with being in A-fib.  No angina despite having mild ischemia on 2 follow-up stress test.      Hx of CABG   4 of 5 potential target vessels grafted.  Likely the diagonal branch was too small to graft although it does cover a sizable distribution.  Quite likely the source of his ischemia on stress test.  Unless he has symptoms, I would prefer not to repeat stress test.      Relevant Orders   EKG 12-Lead (Completed)    Pulmonary nodules Incidental pulmonary nodules on imaging. Follow-up CT scan scheduled. - PCP has ordered CT scan to monitor pulmonary nodules.     Follow-Up: Return in about 6 months (around 02/10/2025) for Alternate 6 month follow-up with APP & MD.  I spent 52 minutes in the care of Tanda JAYSON Bridegroom today including reviewing outside labs from PCP via [facility] (1 minute), reviewing studies (previous cath films reviewed-2 minutes; current Myoview  reviewed and compared to previous Myoview -6 minutes = 8 minutes total)), face to face time discussing treatment options (26 minutes), reviewing records from previous notes (3 minutes), 11 minutes dictating/annotating, and documenting in the  encounter.     Signed, Alm MICAEL Clay, MD, MS Alm Clay, M.D., M.S. Interventional Chartered certified accountant  Pager # [phone]

## 2024-08-14 NOTE — Assessment & Plan Note (Signed)
 Cholesterol levels controlled with atorvastatin . Recent lipid panel: total cholesterol 108 mg/dL, triglycerides 76 mg/dL, HDL 36 mg/dL, LDL 56 mg/dL. - Continue atorvastatin  20 mg daily.

## 2024-08-14 NOTE — Assessment & Plan Note (Signed)
 He has had mild lateral ischemia noted on 2 different stress test first in 2019 and now most recently in June 2025.  Reviewing the images of his cath films with suggestive this distribution is either perfused by the ramus intermedius branch or more likely the on grafted small diagonal branch.  Since he is not having symptoms my suspicion is that this is probably the diagonal branch and we would not pursue invasive evaluation or treatment unless he were to have symptoms.SABRA

## 2024-08-14 NOTE — Assessment & Plan Note (Signed)
 Unstable angina occurred in the setting of A-fib RVR that led to cardiac catheterization.  I suspect that the initial episode could have been related to coronary ischemia from severe left main disease.  Now post CABG he is not having symptoms with being in A-fib.  No angina despite having mild ischemia on 2 follow-up stress test.

## 2024-08-14 NOTE — Assessment & Plan Note (Signed)
 Alfred Thompson is 3.  Since he is relatively asymptomatic now being in A-fib, I prefer to continue with Eliquis  for stroke prophylaxis.  Provided he is not symptomatic in A-fib and requiring intermittent anticoagulation, my recommendation will be to allow for holding Eliquis  2 to 3 days preop for surgery.

## 2024-08-14 NOTE — Assessment & Plan Note (Signed)
 Recent stress test showed mild ischemia in the basal-apical lateral wall suggesting ischemia in either a diagonal or ramus distribution.  Normal ejection fraction at 64%. (On my review of his cath films, he did have a D1 branch that was relatively small but covering a large distribution that had a 80% stenosis that could quite likely lead to a small area of ischemia since it was not grafted and there is left main disease plus disease in this branch.).  Asymptomatic with low-risk stress test. Possible graft insufficiency versus on graft to diagonal branch, but asymptomatic.  In the absence of symptoms no indication for invasive evaluation.  Simply continue medical management. - Monitor for symptoms such as chest pain or dyspnea. - If symptoms develop, consider coronary angiography to assess grafts..  He is on minimal medical therapy having been intolerant of other medications in the past. On modest dose atorvastatin  20 mg daily with well-controlled lipids Not really hypertensive and therefore only tolerating 25 mg Toprol .  With prior issues with fatigue, would not titrate further Not on aspirin  or Plavix because of long-term Eliquis  due to A-fib.

## 2024-08-14 NOTE — Assessment & Plan Note (Signed)
 Stable.  Continue to recommend foot elevation and support stockings.

## 2024-08-14 NOTE — Assessment & Plan Note (Signed)
 History of bradycardia.  Better with reduced dose of beta-blocker. No longer on amiodarone .

## 2024-08-14 NOTE — Assessment & Plan Note (Addendum)
 4 of 5 potential target vessels grafted.  Likely the diagonal branch was too small to graft although it does cover a sizable distribution.  Quite likely the source of his ischemia on stress test.  Unless he has symptoms, I would prefer not to repeat stress test.

## 2024-08-19 ENCOUNTER — Other Ambulatory Visit: Payer: Self-pay | Admitting: Cardiology

## 2024-08-21 ENCOUNTER — Ambulatory Visit
Admission: RE | Admit: 2024-08-21 | Discharge: 2024-08-21 | Disposition: A | Discharge status: Home / Self Care | Source: Ambulatory Visit | Attending: Family Medicine | Admitting: Family Medicine

## 2024-08-21 DIAGNOSIS — J984 Other disorders of lung: Secondary | ICD-10-CM | POA: Diagnosis not present

## 2024-08-21 DIAGNOSIS — R911 Solitary pulmonary nodule: Secondary | ICD-10-CM

## 2024-08-21 DIAGNOSIS — R918 Other nonspecific abnormal finding of lung field: Secondary | ICD-10-CM | POA: Diagnosis not present

## 2024-08-31 ENCOUNTER — Ambulatory Visit: Admitting: Cardiology

## 2024-09-02 ENCOUNTER — Other Ambulatory Visit: Payer: Self-pay | Admitting: Cardiology

## 2024-10-04 ENCOUNTER — Other Ambulatory Visit: Payer: Self-pay | Admitting: Cardiology

## 2024-10-16 ENCOUNTER — Other Ambulatory Visit: Payer: Self-pay | Admitting: Cardiology

## 2024-10-16 DIAGNOSIS — I480 Paroxysmal atrial fibrillation: Secondary | ICD-10-CM

## 2024-10-16 NOTE — Telephone Encounter (Signed)
 Eliquis  5mg  refill request received. Patient is 78 years old, weight-93.4kg, Crea-0.93 on 01/13/24-via [facility] from [place], [place], and last seen by Dr. Anner on 08/10/24. Dose is appropriate based on dosing criteria. Will send in refill to requested pharmacy.

## 2024-10-30 ENCOUNTER — Telehealth: Payer: Self-pay | Admitting: Cardiology

## 2024-10-30 MED ORDER — METOPROLOL SUCCINATE ER 25 MG PO TB24: 25.0000 mg | ORAL_TABLET | Freq: Every day | ORAL | 1 refills | Status: AC

## 2024-10-30 NOTE — Telephone Encounter (Signed)
 Refill sent in

## 2024-10-30 NOTE — Telephone Encounter (Signed)
*  STAT* If patient is at the pharmacy, call can be transferred to refill team.   1. Which medications need to be refilled? (please list name of each medication and dose if known)   metoprolol  succinate (TOPROL -XL) 25 MG 24 hr tablet     2. Would you like to learn more about the convenience, safety, & potential cost savings by using the [facility]? No    3. Are you open to using the [facility] (Type [facility]. No    4. Which pharmacy/location (including street and city if local pharmacy) is medication to be sent to? [id number] - [place], [place] - [address]     5. Do they need a 30 day or 90 day supply? 90 day

## 2025-02-10 ENCOUNTER — Ambulatory Visit: Admitting: General Practice
